# Patient Record
Sex: Male | Born: 1970 | Race: White | Hispanic: No | Marital: Single | Smoking: Current every day smoker
Health system: Southern US, Community
[De-identification: ages and names within clinical notes are randomized; demographics above are authoritative.]

## PROBLEM LIST (undated history)

## (undated) DIAGNOSIS — G4730 Sleep apnea, unspecified: Secondary | ICD-10-CM

## (undated) DIAGNOSIS — G4700 Insomnia, unspecified: Secondary | ICD-10-CM

## (undated) DIAGNOSIS — K4020 Bilateral inguinal hernia, without obstruction or gangrene, not specified as recurrent: Secondary | ICD-10-CM

## (undated) DIAGNOSIS — K439 Ventral hernia without obstruction or gangrene: Secondary | ICD-10-CM

## (undated) DIAGNOSIS — G5601 Carpal tunnel syndrome, right upper limb: Secondary | ICD-10-CM

## (undated) DIAGNOSIS — S99199A Other physeal fracture of unspecified metatarsal, initial encounter for closed fracture: Secondary | ICD-10-CM

## (undated) DIAGNOSIS — Z720 Tobacco use: Secondary | ICD-10-CM

## (undated) DIAGNOSIS — N529 Male erectile dysfunction, unspecified: Secondary | ICD-10-CM

## (undated) DIAGNOSIS — K219 Gastro-esophageal reflux disease without esophagitis: Secondary | ICD-10-CM

## (undated) DIAGNOSIS — I10 Essential (primary) hypertension: Secondary | ICD-10-CM

## (undated) DIAGNOSIS — L719 Rosacea, unspecified: Secondary | ICD-10-CM

## (undated) DIAGNOSIS — Z973 Presence of spectacles and contact lenses: Secondary | ICD-10-CM

## (undated) HISTORY — PX: TONSILLECTOMY AND ADENOIDECTOMY: SUR1326

## (undated) HISTORY — PX: UVULOPALATOPHARYNGOPLASTY: SHX827

## (undated) HISTORY — PX: BACK SURGERY: SHX140

## (undated) HISTORY — PX: HERNIA REPAIR: SHX51

## (undated) HISTORY — PX: KNEE SURGERY: SHX244

---

## 1998-02-22 HISTORY — PX: NASAL SINUS SURGERY: SHX719

## 2015-01-15 ENCOUNTER — Other Ambulatory Visit: Payer: Self-pay | Admitting: Family Medicine

## 2015-01-15 DIAGNOSIS — I4949 Other premature depolarization: Secondary | ICD-10-CM

## 2015-01-15 DIAGNOSIS — R9431 Abnormal electrocardiogram [ECG] [EKG]: Secondary | ICD-10-CM

## 2015-01-17 ENCOUNTER — Telehealth: Payer: Self-pay

## 2015-01-17 NOTE — Telephone Encounter (Signed)
He is still having head congestion, cough, blowing junk out of the nose. Wants to know if you would call in an antibiotic.

## 2015-01-17 NOTE — Telephone Encounter (Signed)
Pt. Notified.

## 2015-01-17 NOTE — Telephone Encounter (Signed)
We don't usually do antibiotics over the phone Most of these types of illness are viral and will resolve over time with rest, hydration, vitamin C, green tea, avoidance of smoking If fever develops, not getting better, please schedule appt or go to urgent care

## 2015-01-19 ENCOUNTER — Encounter: Payer: Self-pay | Admitting: Family Medicine

## 2015-01-19 ENCOUNTER — Ambulatory Visit
Admission: RE | Admit: 2015-01-19 | Discharge: 2015-01-19 | Disposition: A | Discharge status: Home / Self Care | Payer: BLUE CROSS/BLUE SHIELD | Source: Ambulatory Visit | Attending: Family Medicine | Admitting: Family Medicine

## 2015-01-19 DIAGNOSIS — I4949 Other premature depolarization: Secondary | ICD-10-CM

## 2015-01-19 DIAGNOSIS — I517 Cardiomegaly: Secondary | ICD-10-CM | POA: Insufficient documentation

## 2015-01-19 DIAGNOSIS — R9431 Abnormal electrocardiogram [ECG] [EKG]: Secondary | ICD-10-CM

## 2015-01-19 MED ORDER — PERFLUTREN LIPID MICROSPHERE
1.0000 mL | INTRAVENOUS | Status: AC | PRN
  Administered 2015-01-19: 1 mL via INTRAVENOUS

## 2015-01-19 NOTE — Progress Notes (Signed)
*  PRELIMINARY RESULTS* Echocardiogram 2D Echocardiogram has been performed.  Edward Mckenzie 01/19/2015, 11:31 AM

## 2015-01-31 ENCOUNTER — Other Ambulatory Visit: Payer: Self-pay

## 2015-01-31 MED ORDER — GABAPENTIN 300 MG PO CAPS: 300.0000 mg | ORAL_CAPSULE | Freq: Three times a day (TID) | ORAL | Status: DC

## 2015-03-28 ENCOUNTER — Other Ambulatory Visit: Payer: Self-pay | Admitting: Family Medicine

## 2015-03-28 MED ORDER — GABAPENTIN 300 MG PO CAPS: 300.0000 mg | ORAL_CAPSULE | Freq: Three times a day (TID) | ORAL | Status: DC

## 2015-03-28 NOTE — Telephone Encounter (Signed)
Routing to provider  

## 2015-03-28 NOTE — Addendum Note (Signed)
Addended byWende Mott J on: 03/28/2015 09:31 AM   Modules accepted: Orders

## 2015-04-02 ENCOUNTER — Telehealth: Payer: Self-pay | Admitting: Family Medicine

## 2015-04-02 ENCOUNTER — Ambulatory Visit (INDEPENDENT_AMBULATORY_CARE_PROVIDER_SITE_OTHER): Payer: BLUE CROSS/BLUE SHIELD | Admitting: Unknown Physician Specialty

## 2015-04-02 ENCOUNTER — Encounter: Payer: Self-pay | Admitting: Unknown Physician Specialty

## 2015-04-02 VITALS — BP 158/90 | HR 76 | Temp 98.5°F | Wt 177.4 lb

## 2015-04-02 DIAGNOSIS — J309 Allergic rhinitis, unspecified: Secondary | ICD-10-CM

## 2015-04-02 DIAGNOSIS — R9431 Abnormal electrocardiogram [ECG] [EKG]: Secondary | ICD-10-CM | POA: Insufficient documentation

## 2015-04-02 DIAGNOSIS — M659 Synovitis and tenosynovitis, unspecified: Secondary | ICD-10-CM

## 2015-04-02 MED ORDER — IBUPROFEN 800 MG PO TABS: 800.0000 mg | ORAL_TABLET | Freq: Three times a day (TID) | ORAL | Status: DC | PRN

## 2015-04-02 NOTE — Progress Notes (Signed)
   BP 158/90 mmHg  Pulse 76  Temp(Src) 98.5 F (36.9 C)  Wt 177 lb 6.4 oz (80.468 kg)  SpO2 96%   Subjective:    Patient ID: Edward Mckenzie, male    DOB: 1971-03-31, 44 y.o.   MRN: [id number]  HPI: [name] is a 44 y.o. male  Chief Complaint  Patient presents with  . Leg Injury    pt states he was playing with daughter and heard and felt something pop in calf on right leg   Leg pain Pt with the above history.  States his left lower leg is swollen and painful.  Difficulty walking.  Can point and flex foot but finding it difficult.  Taking Ibuprofen providing mild relief.  Has a job in which he is on his feet all day.    Relevant past medical, surgical, family and social history reviewed and updated as indicated. Interim medical history since our last visit reviewed. Allergies and medications reviewed and updated.  Review of Systems  Per HPI unless specifically indicated above     Objective:    BP 158/90 mmHg  Pulse 76  Temp(Src) 98.5 F (36.9 C)  Wt 177 lb 6.4 oz (80.468 kg)  SpO2 96%  Wt Readings from Last 3 Encounters:  04/02/15 177 lb 6.4 oz (80.468 kg)  01/03/15 179 lb (81.194 kg)    Physical Exam  Constitutional: He is oriented to person, place, and time. He appears well-developed and well-nourished. No distress.  HENT:  Head: Normocephalic and atraumatic.  Eyes: Conjunctivae and lids are normal. Right eye exhibits no discharge. Left eye exhibits no discharge. No scleral icterus.  Cardiovascular: Normal rate, regular rhythm and normal heart sounds.   Pulmonary/Chest: Effort normal and breath sounds normal. No respiratory distress.  Abdominal: Normal appearance. There is no splenomegaly or hepatomegaly. There is tenderness.  Musculoskeletal: Normal range of motion.       Right knee: He exhibits swelling. He exhibits no erythema.  Right calf very tender.  Discussed with Dr. Sanda Klein who saw the patient.  Will refer to Orthopedics.     Neurological: He is alert and oriented to person, place, and time.  Skin: Skin is intact. No rash noted. No pallor.  Psychiatric: He has a normal mood and affect. His behavior is normal. Judgment and thought content normal.    No results found for this or any previous visit.    Assessment & Plan:   Problem List Items Addressed This Visit    None    Visit Diagnoses    Tenosynovitis of lower leg    -  Primary    ? tear muscle or tendon    Relevant Orders    Ambulatory referral to Orthopedic Surgery        Follow up plan: Return for Refer to orthopedics soon.  .   Use crutches.  Elevate leg using heat or ice.

## 2015-04-02 NOTE — Telephone Encounter (Signed)
Text with NP this evening to find out disposition of patient from earlier today (surgery?), she replied patient was referred to specialist but did not get in today to see one; no MRI; she will check on the referral tomorrow I called patient's home number; left detailed message; reasons to go to the ER include pins and needles, tight shiny skin, worsening deep pain, etc. (described s/s of acute compartment syndrome) I do suspect possible tear of muscle and/or tendon, but swelling can lead to compartment syndrome Just wanted to follow-up

## 2015-04-02 NOTE — Patient Instructions (Signed)
Use crutches.  Elevate with heat or ice.

## 2015-04-03 NOTE — Telephone Encounter (Signed)
Edward Mckenzie, please make sure patient sees orthopaedist TODAY

## 2015-04-04 NOTE — Telephone Encounter (Signed)
I checked with Tiffany yesterday and patient had appt with Dr. Prescott Parma (ortho) at 11 am

## 2015-05-30 ENCOUNTER — Telehealth: Payer: Self-pay | Admitting: Family Medicine

## 2015-05-30 MED ORDER — MONTELUKAST SODIUM 10 MG PO TABS: 10.0000 mg | ORAL_TABLET | Freq: Every day | ORAL | Status: DC

## 2015-05-30 NOTE — Telephone Encounter (Signed)
Pt has 2 pills left until refill but would like to increase his singulair sent to [facility] [address] because he's having a rough time with his allergies

## 2015-05-30 NOTE — Telephone Encounter (Signed)
See message below, patient would like Singular to be increased but he already takes 10mg .

## 2015-05-30 NOTE — Telephone Encounter (Signed)
Let him know that the MAXIMUM dose of Singulair (montelukast) is 10 mg; he is already taking the maximum dose; I sent in refills I'll suggest PLAIN antihistamine like Zyrtec or Allegra or Claritin; generic version is fine; only take one, per package directions; he can do that with the Singulair I'll suggest referral to an allergist for testing and possibly allergy shots if really bothered; that helps many people

## 2015-05-31 NOTE — Telephone Encounter (Signed)
Spoke with pt and he stated that he was already taking a benadryl a day and i then advised him of the 3 medications suggested. He says he will try that route then make an appt to come in for referral if it doesn't get any better.

## 2015-07-27 ENCOUNTER — Other Ambulatory Visit: Payer: Self-pay | Admitting: Family Medicine

## 2015-07-27 NOTE — Telephone Encounter (Signed)
Routing to provider  

## 2015-07-28 NOTE — Telephone Encounter (Signed)
rx approved

## 2015-11-28 ENCOUNTER — Other Ambulatory Visit: Payer: Self-pay

## 2015-11-28 MED ORDER — GABAPENTIN 300 MG PO CAPS: 300.0000 mg | ORAL_CAPSULE | Freq: Three times a day (TID) | ORAL | Status: DC

## 2015-11-28 NOTE — Telephone Encounter (Signed)
Thank you; he has an upcoming appt in May; Rx approved

## 2015-11-28 NOTE — Telephone Encounter (Signed)
Routing to provider, he is following you to [place].

## 2015-12-18 ENCOUNTER — Encounter: Payer: Self-pay | Admitting: *Deleted

## 2015-12-18 NOTE — Discharge Instructions (Signed)
[facility] °[facility] ° °POST OPERATIVE INSTRUCTIONS FOR DR. TROXLER AND DR. FOWLER °[facility] ° ° °1. Take your medication as prescribed.  Pain medication should be taken only as needed. ° °2. Keep the dressing clean, dry and intact. ° °3. Keep your foot elevated above the heart level for the first 48 hours. ° °4. Walking to the bathroom and brief periods of walking are acceptable, unless we have instructed you to be non-weight bearing. ° °5. Always wear your post-op shoe when walking.  Always use your crutches if you are to be non-weight bearing. ° °6. Do not take a shower. Baths are permissible as long as the foot is kept out of the water.  ° °7. Every hour you are awake:  °- Bend your knee 15 times. °- Flex foot 15 times °- Massage calf 15 times ° °8. Call [facility] ([phone]) if any of the following problems occur: °- You develop a temperature or fever. °- The bandage becomes saturated with blood. °- Medication does not stop your pain. °- Injury of the foot occurs. °- Any symptoms of infection including redness, odor, or red streaks running from wound. ° °General Anesthesia, Adult, Care After °Refer to this sheet in the next few weeks. These instructions provide you with information on caring for yourself after your procedure. Your health care provider may also give you more specific instructions. Your treatment has been planned according to current medical practices, but problems sometimes occur. Call your health care provider if you have any problems or questions after your procedure. °WHAT TO EXPECT AFTER THE PROCEDURE °After the procedure, it is typical to experience: °· Sleepiness. °· Nausea and vomiting. °HOME CARE INSTRUCTIONS °· For the first 24 hours after general anesthesia: °¨ Have a responsible person with you. °¨ Do not drive a car. If you are alone, do not take public transportation. °¨ Do not drink alcohol. °¨ Do not take  medicine that has not been prescribed by your health care provider. °¨ Do not sign important papers or make important decisions. °¨ You may resume a normal diet and activities as directed by your health care provider. °· Change bandages (dressings) as directed. °· If you have questions or problems that seem related to general anesthesia, call the hospital and ask for the anesthetist or anesthesiologist on call. °SEEK MEDICAL CARE IF: °· You have nausea and vomiting that continue the day after anesthesia. °· You develop a rash. °SEEK IMMEDIATE MEDICAL CARE IF:  °· You have difficulty breathing. °· You have chest pain. °· You have any allergic problems. °  °This information is not intended to replace advice given to you by your health care provider. Make sure you discuss any questions you have with your health care provider. °  °Document Released: 11/03/2000 Document Revised: 08/18/2014 Document Reviewed: 11/26/2011 °Elsevier Interactive Patient Education ©2016 [employer] ° °

## 2015-12-20 ENCOUNTER — Ambulatory Visit
Admission: RE | Admit: 2015-12-20 | Discharge: 2015-12-20 | Disposition: A | Discharge status: Home / Self Care | Payer: BLUE CROSS/BLUE SHIELD | Source: Ambulatory Visit | Attending: Podiatry | Admitting: Podiatry

## 2015-12-20 ENCOUNTER — Ambulatory Visit: Payer: BLUE CROSS/BLUE SHIELD | Admitting: Anesthesiology

## 2015-12-20 ENCOUNTER — Encounter: Payer: Self-pay | Admitting: *Deleted

## 2015-12-20 ENCOUNTER — Encounter
Admission: RE | Disposition: A | Discharge status: Home / Self Care | Payer: Self-pay | Source: Ambulatory Visit | Attending: Podiatry

## 2015-12-20 DIAGNOSIS — X58XXXA Exposure to other specified factors, initial encounter: Secondary | ICD-10-CM | POA: Insufficient documentation

## 2015-12-20 DIAGNOSIS — Z79899 Other long term (current) drug therapy: Secondary | ICD-10-CM | POA: Diagnosis not present

## 2015-12-20 DIAGNOSIS — S92352A Displaced fracture of fifth metatarsal bone, left foot, initial encounter for closed fracture: Secondary | ICD-10-CM | POA: Insufficient documentation

## 2015-12-20 DIAGNOSIS — F17200 Nicotine dependence, unspecified, uncomplicated: Secondary | ICD-10-CM | POA: Diagnosis not present

## 2015-12-20 DIAGNOSIS — Y939 Activity, unspecified: Secondary | ICD-10-CM | POA: Insufficient documentation

## 2015-12-20 HISTORY — DX: Presence of spectacles and contact lenses: Z97.3

## 2015-12-20 HISTORY — DX: Carpal tunnel syndrome, right upper limb: G56.01

## 2015-12-20 HISTORY — DX: Other physeal fracture of unspecified metatarsal, initial encounter for closed fracture: S99.199A

## 2015-12-20 HISTORY — PX: ORIF TOE FRACTURE: SHX5032

## 2015-12-20 SURGERY — OPEN REDUCTION INTERNAL FIXATION (ORIF) METATARSAL (TOE) FRACTURE
Anesthesia: General | Site: Foot | Laterality: Left | Wound class: Clean

## 2015-12-20 MED ORDER — FENTANYL CITRATE (PF) 100 MCG/2ML IJ SOLN: 25.0000 ug | INTRAMUSCULAR | Status: DC | PRN

## 2015-12-20 MED ORDER — LIDOCAINE HCL (CARDIAC) 20 MG/ML IV SOLN
INTRAVENOUS | Status: DC | PRN
  Administered 2015-12-20: 30 mg via INTRATRACHEAL

## 2015-12-20 MED ORDER — LACTATED RINGERS IV SOLN
INTRAVENOUS | Status: DC
  Administered 2015-12-20: 10:00:00 via INTRAVENOUS

## 2015-12-20 MED ORDER — DEXAMETHASONE SODIUM PHOSPHATE 4 MG/ML IJ SOLN
INTRAMUSCULAR | Status: DC | PRN
  Administered 2015-12-20: 8 mg via INTRAVENOUS

## 2015-12-20 MED ORDER — ROPIVACAINE HCL 5 MG/ML IJ SOLN
INTRAMUSCULAR | Status: DC | PRN
  Administered 2015-12-20: 40 mL via PERINEURAL

## 2015-12-20 MED ORDER — GLYCOPYRROLATE 0.2 MG/ML IJ SOLN
INTRAMUSCULAR | Status: DC | PRN
  Administered 2015-12-20: 0.2 mg via INTRAVENOUS

## 2015-12-20 MED ORDER — ONDANSETRON HCL 4 MG/2ML IJ SOLN: 4.0000 mg | Freq: Once | INTRAMUSCULAR | Status: DC | PRN

## 2015-12-20 MED ORDER — ONDANSETRON HCL 4 MG/2ML IJ SOLN
INTRAMUSCULAR | Status: DC | PRN
  Administered 2015-12-20: 4 mg via INTRAVENOUS

## 2015-12-20 MED ORDER — SCOPOLAMINE 1 MG/3DAYS TD PT72
1.0000 | MEDICATED_PATCH | Freq: Once | TRANSDERMAL | Status: DC
  Administered 2015-12-20: 1.5 mg via TRANSDERMAL

## 2015-12-20 MED ORDER — FENTANYL CITRATE (PF) 100 MCG/2ML IJ SOLN
INTRAMUSCULAR | Status: DC | PRN
  Administered 2015-12-20: 100 ug via INTRAVENOUS

## 2015-12-20 MED ORDER — OXYCODONE-ACETAMINOPHEN 7.5-325 MG PO TABS: 1.0000 | ORAL_TABLET | ORAL | Status: DC | PRN

## 2015-12-20 MED ORDER — CEFAZOLIN SODIUM-DEXTROSE 2-4 GM/100ML-% IV SOLN
2.0000 g | Freq: Once | INTRAVENOUS | Status: AC
  Administered 2015-12-20: 2 g via INTRAVENOUS

## 2015-12-20 MED ORDER — PROPOFOL 10 MG/ML IV BOLUS
INTRAVENOUS | Status: DC | PRN
  Administered 2015-12-20: 200 mg via INTRAVENOUS

## 2015-12-20 MED ORDER — MIDAZOLAM HCL 2 MG/2ML IJ SOLN
INTRAMUSCULAR | Status: DC | PRN
  Administered 2015-12-20: 2 mg via INTRAVENOUS

## 2015-12-20 SURGICAL SUPPLY — 52 items
APL SKNCLS STERI-STRIP NONHPOA (GAUZE/BANDAGES/DRESSINGS)
BANDAGE ELASTIC 4 VELCRO NS (GAUZE/BANDAGES/DRESSINGS) ×2 IMPLANT
BENZOIN TINCTURE PRP APPL 2/3 (GAUZE/BANDAGES/DRESSINGS) ×1 IMPLANT
BIT DRILL CANN 3.5MM (DRILL) IMPLANT
BNDG CMPR 75X41 PLY HI ABS (GAUZE/BANDAGES/DRESSINGS) ×1
BNDG ESMARK 4X12 TAN STRL LF (GAUZE/BANDAGES/DRESSINGS) ×2 IMPLANT
BNDG GAUZE 4.5X4.1 6PLY STRL (MISCELLANEOUS) ×2 IMPLANT
BNDG STRETCH 4X75 STRL LF (GAUZE/BANDAGES/DRESSINGS) ×2 IMPLANT
CANISTER SUCT 1200ML W/VALVE (MISCELLANEOUS) ×2 IMPLANT
COVER LIGHT HANDLE UNIVERSAL (MISCELLANEOUS) ×4 IMPLANT
COVER PIN YLW 0.028-062 (MISCELLANEOUS) IMPLANT
CUFF TOURN SGL QUICK 18 (TOURNIQUET CUFF) ×1 IMPLANT
DRAPE FLUOR MINI C-ARM 54X84 (DRAPES) ×2 IMPLANT
DRILL CANN 3.5MM (DRILL) ×2
DURAPREP 26ML APPLICATOR (WOUND CARE) ×2 IMPLANT
GAUZE PETRO XEROFOAM 1X8 (MISCELLANEOUS) ×2 IMPLANT
GAUZE SPONGE 4X4 12PLY STRL (GAUZE/BANDAGES/DRESSINGS) ×2 IMPLANT
GLOVE BIO SURGEON STRL SZ8 (GLOVE) ×2 IMPLANT
GOWN STRL REUS W/ TWL LRG LVL3 (GOWN DISPOSABLE) ×1 IMPLANT
GOWN STRL REUS W/ TWL XL LVL3 (GOWN DISPOSABLE) ×1 IMPLANT
GOWN STRL REUS W/TWL LRG LVL3 (GOWN DISPOSABLE) ×4
GOWN STRL REUS W/TWL XL LVL3 (GOWN DISPOSABLE) ×2
K-WIRE DBL END TROCAR 6X.045 (WIRE)
K-WIRE DBL END TROCAR 6X.062 (WIRE)
KIT ROOM TURNOVER OR (KITS) ×2 IMPLANT
KWIRE DBL END TROCAR 6X.045 (WIRE) IMPLANT
KWIRE DBL END TROCAR 6X.062 (WIRE) IMPLANT
KWIRE SMOOTH 1.8 (WIRE) ×1 IMPLANT
NDL HYPO 25GX1X1/2 BEV (NEEDLE) IMPLANT
NEEDLE HYPO 25GX1X1/2 BEV (NEEDLE) IMPLANT
NS IRRIG 500ML POUR BTL (IV SOLUTION) ×2 IMPLANT
PACK EXTREMITY ARMC (MISCELLANEOUS) ×2 IMPLANT
PAD GROUND ADULT SPLIT (MISCELLANEOUS) ×2 IMPLANT
PADDING CAST 4IN STRL (MISCELLANEOUS) ×2
PADDING CAST BLEND 4X4 STRL (MISCELLANEOUS) ×2 IMPLANT
SCREW CANN 5.0X50MM (Screw) ×2 IMPLANT
SCREW CANN PT 50X5XCANN NS MTP (Screw) ×1 IMPLANT
SCREW CANN PT 5X50 NS (Screw) IMPLANT
SPLINT FAST PLASTER 5X30 (CAST SUPPLIES) ×1
SPLINT PLASTER CAST FAST 5X30 (CAST SUPPLIES) ×1 IMPLANT
STIMULATOR BONE (ORTHOPEDIC SUPPLIES) ×1
STIMULATOR BONE GROWTH EMG EXT (ORTHOPEDIC SUPPLIES) IMPLANT
STOCKINETTE STRL 6IN 960660 (GAUZE/BANDAGES/DRESSINGS) ×2 IMPLANT
STRAP BODY AND KNEE 60X3 (MISCELLANEOUS) ×3 IMPLANT
STRIP CLOSURE SKIN 1/4X4 (GAUZE/BANDAGES/DRESSINGS) ×2 IMPLANT
SUT ETHILON 4-0 (SUTURE)
SUT ETHILON 4-0 FS2 18XMFL BLK (SUTURE)
SUT ETHILON 5-0 FS-2 18 BLK (SUTURE) ×1 IMPLANT
SUT VIC AB 4-0 FS2 27 (SUTURE) ×2 IMPLANT
SUT VICRYL AB 3-0 FS1 BRD 27IN (SUTURE) IMPLANT
SUTURE ETHLN 4-0 FS2 18XMF BLK (SUTURE) IMPLANT
SYRINGE 10CC LL (SYRINGE) IMPLANT

## 2015-12-20 NOTE — Transfer of Care (Signed)
Immediate Anesthesia Transfer of Care Note  Patient: Edward Mckenzie  Procedure(s) Performed: Procedure(s) with comments: OPEN REDUCTION INTERNAL FIXATION (ORIF) 5TH METATARSAL (TOE) FRACTURE LEFT (Left) - LMA WITH POPLITEAL  Patient Location: PACU  Anesthesia Type: General LMA  Level of Consciousness: awake, alert  and patient cooperative  Airway and Oxygen Therapy: Patient Spontanous Breathing and Patient connected to supplemental oxygen  Post-op Assessment: Post-op Vital signs reviewed, Patient's Cardiovascular Status Stable, Respiratory Function Stable, Patent Airway and No signs of Nausea or vomiting  Post-op Vital Signs: Reviewed and stable  Complications: No apparent anesthesia complications

## 2015-12-20 NOTE — Progress Notes (Signed)
Assisted Clance Boll ANMD with left, ultrasound guided, popliteal block. Side rails up, monitors on throughout procedure. See vital signs in flow sheet. Tolerated Procedure well.

## 2015-12-20 NOTE — H&P (Signed)
H and P has been reviewed and no changes are noted.  

## 2015-12-20 NOTE — Anesthesia Procedure Notes (Addendum)
Anesthesia Regional Block:  Popliteal block  Pre-Anesthetic Checklist: ,, timeout performed, Correct Patient, Correct Site, Correct Laterality, Correct Procedure, Correct Position, site marked, Risks and benefits discussed,  Surgical consent,  Pre-op evaluation,  At surgeon's request and post-op pain management  Laterality: Left  Prep: chloraprep       Needles:  Injection technique: Single-shot  Needle Type: Echogenic Needle     Needle Length: 9cm 9 cm Needle Gauge: 21 and 21 G    Additional Needles:  Procedures: ultrasound guided (picture in chart) Popliteal block Narrative:  Start time: 12/20/2015 10:41 AM End time: 12/20/2015 10:49 AM Injection made incrementally with aspirations every 5 mL.  Performed by: Personally  Anesthesiologist: Ronelle Nigh  Additional Notes: Functioning IV was confirmed and monitors applied. Ultrasound guidance: relevant anatomy identified, needle position confirmed, local anesthetic spread visualized around nerve(s)., vascular puncture avoided.  Image printed for medical record.  Negative aspiration and no paresthesias; incremental administration of local anesthetic. The patient tolerated the procedure well. Vitals signes recorded in RN notes.  30 ml to Popliteal, 10 ml to Adductor Canal.   Procedure Name: LMA Insertion Date/Time: 12/20/2015 12:42 PM Performed by: Londell Moh Pre-anesthesia Checklist: Patient identified, Emergency Drugs available, Suction available, Timeout performed and Patient being monitored Patient Re-evaluated:Patient Re-evaluated prior to inductionOxygen Delivery Method: Circle system utilized Preoxygenation: Pre-oxygenation with 100% oxygen Intubation Type: IV induction LMA: LMA inserted LMA Size: 4.0 Number of attempts: 1 Placement Confirmation: positive ETCO2 and breath sounds checked- equal and bilateral Tube secured with: Tape

## 2015-12-20 NOTE — Anesthesia Preprocedure Evaluation (Signed)
Anesthesia Evaluation  Patient identified by MRN, date of birth, ID band  Reviewed: Allergy & Precautions, H&P , NPO status , Patient's Chart, lab work & pertinent test results  Airway Mallampati: II  TM Distance: >3 FB Neck ROM: full    Dental no notable dental hx.    Pulmonary Current Smoker,    Pulmonary exam normal        Cardiovascular  Rhythm:regular Rate:Normal     Neuro/Psych    GI/Hepatic   Endo/Other    Renal/GU      Musculoskeletal   Abdominal   Peds  Hematology   Anesthesia Other Findings   Reproductive/Obstetrics                             Anesthesia Physical Anesthesia Plan  ASA: II  Anesthesia Plan: General LMA   Post-op Pain Management: GA combined w/ Regional for post-op pain   Induction:   Airway Management Planned:   Additional Equipment:   Intra-op Plan:   Post-operative Plan:   Informed Consent: I have reviewed the patients History and Physical, chart, labs and discussed the procedure including the risks, benefits and alternatives for the proposed anesthesia with the patient or authorized representative who has indicated his/her understanding and acceptance.     Plan Discussed with: CRNA  Anesthesia Plan Comments:         Anesthesia Quick Evaluation

## 2015-12-20 NOTE — Anesthesia Postprocedure Evaluation (Signed)
Anesthesia Post Note  Patient: HARMON DEIGHAN  Procedure(s) Performed: Procedure(s) (LRB): OPEN REDUCTION INTERNAL FIXATION (ORIF) 5TH METATARSAL (TOE) FRACTURE LEFT (Left)  Patient location during evaluation: PACU Anesthesia Type: General Level of consciousness: awake and alert and oriented Pain management: satisfactory to patient Vital Signs Assessment: post-procedure vital signs reviewed and stable Respiratory status: spontaneous breathing, nonlabored ventilation and respiratory function stable Cardiovascular status: blood pressure returned to baseline and stable Postop Assessment: Adequate PO intake and No signs of nausea or vomiting Anesthetic complications: no    Raliegh Ip

## 2015-12-20 NOTE — Op Note (Signed)
Operative note   Surgeon: Dr. Albertine Patricia, DPM.    Assistant: None    Preop diagnosis: Ronnald Ramp fracture fifth metatarsal left foot    Postop diagnosis: Same    Procedure:   1. ORIF Jones fracture fifth metatarsal left foot with 5.0 cannulated screw from [employer]          EBL: Less than 10 cc    Anesthesia:general with popliteal block delivered by anesthesia team    Hemostasis: None    Specimen: None    Complications: None    Operative indications: Jones fracture fifth metatarsal left foot with some minor displacement. Jones fractures are well known to have slow healing progress and do better with ORIF in most cases.    Procedure:  Patient was brought into the OR and placed on the operating table in thesupine position. After anesthesia was obtained theleft lower extremity was prepped and draped in usual sterile fashion.  Operative Report: This time to his directed to the lateral aspect of the left foot where the fifth metatarsal tuberosity was evaluated. A 1 cm linear incision was made approximately 1.52 cm proximal to that. There was deepened with blunt dissection this point a K wire from the 5.0 can a screw set was then run through the tuberosity down the shaft of the fifth metatarsal. There is checked FluoroScan the patient was seen to be in good position and both lateral dorsal plantar and oblique views. This point the area was countersunk and measured and then drilled. A 50 mm 5.0 and a screws and placed across the fracture margin running from the tuberosity proximally down through the shaft distally all screw threads were well distal to the fragment. The fifth metatarsal was compressed and well aligned this point. At this time the incision was copiously irrigated the pin was removed and the incision was closed with 5-0 nylon horizontal mattress sutures. Sterile compressive dressings placed across wound consisting of Xeroform gauze 4 x 4's Kling Kerlix and a posterior splint was  placed on the left foot and leg in the operating room.     Patient tolerated the procedure and anesthesia well.  Was transported from the OR to the PACU with all vital signs stable and vascular status intact. To be discharged per routine protocol.  Will follow up in approximately 1 week in the outpatient clinic.

## 2015-12-21 ENCOUNTER — Encounter: Payer: Self-pay | Admitting: Podiatry

## 2016-01-04 ENCOUNTER — Encounter: Payer: Self-pay | Admitting: Family Medicine

## 2016-01-10 ENCOUNTER — Telehealth: Payer: Self-pay

## 2016-01-10 MED ORDER — GABAPENTIN 300 MG PO CAPS: 300.0000 mg | ORAL_CAPSULE | Freq: Three times a day (TID) | ORAL | Status: DC

## 2016-01-10 NOTE — Telephone Encounter (Signed)
Requesting a refill on  Gabapentin 300mg  : take one tablet by mouth 3 times a day Patient is scheduled for a CPE the middle of June

## 2016-01-24 ENCOUNTER — Encounter: Payer: Self-pay | Admitting: Family Medicine

## 2016-01-24 ENCOUNTER — Ambulatory Visit (INDEPENDENT_AMBULATORY_CARE_PROVIDER_SITE_OTHER): Payer: BLUE CROSS/BLUE SHIELD | Admitting: Family Medicine

## 2016-01-24 VITALS — BP 152/77 | HR 82 | Temp 98.8°F | Ht 69.2 in | Wt 181.0 lb

## 2016-01-24 DIAGNOSIS — R030 Elevated blood-pressure reading, without diagnosis of hypertension: Secondary | ICD-10-CM | POA: Diagnosis not present

## 2016-01-24 DIAGNOSIS — G4700 Insomnia, unspecified: Secondary | ICD-10-CM

## 2016-01-24 DIAGNOSIS — Z0000 Encounter for general adult medical examination without abnormal findings: Secondary | ICD-10-CM

## 2016-01-24 DIAGNOSIS — IMO0001 Reserved for inherently not codable concepts without codable children: Secondary | ICD-10-CM

## 2016-01-24 LAB — UA/M W/RFLX CULTURE, ROUTINE
BILIRUBIN UA: NEGATIVE
GLUCOSE, UA: NEGATIVE
KETONES UA: NEGATIVE
Leukocytes, UA: NEGATIVE
Nitrite, UA: NEGATIVE
PROTEIN UA: NEGATIVE
RBC, UA: NEGATIVE
UUROB: 0.2 mg/dL (ref 0.2–1.0)
pH, UA: 6 (ref 5.0–7.5)

## 2016-01-24 NOTE — Progress Notes (Signed)
BP 152/77 mmHg  Pulse 82  Temp(Src) 98.8 F (37.1 C)  Ht 5' 9.2" (1.758 m)  Wt 181 lb (82.101 kg)  BMI 26.57 kg/m2  SpO2 99%   Subjective:    Patient ID: Edward Mckenzie, male    DOB: 11-10-1970, 45 y.o.   MRN: [id number]  HPI: Edward Mckenzie is a 45 y.o. male presenting on 01/24/2016 for comprehensive medical examination. Current medical complaints include:  INSOMNIA- has been very stressed, drinks caffeine all day up to dinner Duration: chronic Satisfied with sleep quality: no Difficulty falling asleep: yes Difficulty staying asleep: yes Waking a few hours after sleep onset: yes Early morning awakenings: yes Daytime hypersomnolence: yes Wakes feeling refreshed: yes Good sleep hygiene: no Apnea: yes- in the past, had sleep apnea surgery, hasn't had sleep study in a while Snoring: yes Depressed/anxious mood: yes Recent stress: yes Restless legs/nocturnal leg cramps: no Chronic pain/arthritis: no History of sleep study: yes Treatments attempted: melatonin   Interim Problems from his last visit: no  Depression Screen done today and results listed below:  Depression screen [facility] 2/9 01/24/2016  Decreased Interest 0  Down, Depressed, Hopeless 0  PHQ - 2 Score 0  Altered sleeping 3  Tired, decreased energy 2  Change in appetite 0  Feeling bad or failure about yourself  0  Trouble concentrating 0  Moving slowly or fidgety/restless 0  Suicidal thoughts 0  PHQ-9 Score 5    Past Medical History:  Past Medical History  Diagnosis Date  . Carpal tunnel syndrome of right wrist   . Jones fracture     left foot  . Wears contact lenses     Surgical History:  Past Surgical History  Procedure Laterality Date  . Knee surgery    . Back surgery    . Nasal sinus surgery  02/22/98    UPPP, tonguepexy, septo.  Dr. Kathyrn Sheriff. [place]  . Tonsillectomy and adenoidectomy    . Orif toe fracture Left 12/20/2015    Procedure: OPEN REDUCTION INTERNAL FIXATION (ORIF) 5TH  METATARSAL (TOE) FRACTURE LEFT;  Surgeon: Albertine Patricia, DPM;  Location: [place];  Service: Podiatry;  Laterality: Left;  LMA WITH POPLITEAL    Medications:  Current Outpatient Prescriptions on File Prior to Visit  Medication Sig  . fluticasone (FLONASE) 50 MCG/ACT nasal spray INSTILL TWO SPRAYS IN EACH NOSTRIL ONCE A DAY  . gabapentin (NEURONTIN) 300 MG capsule Take 1 capsule (300 mg total) by mouth 3 (three) times daily.  . montelukast (SINGULAIR) 10 MG tablet Take 1 tablet (10 mg total) by mouth at bedtime.  . Omega-3 Fatty Acids (FISH OIL) 1000 MG CAPS Take 1,000 mg by mouth daily.  Marland Kitchen thiamine (VITAMIN B-1) 100 MG tablet Take 100 mg by mouth daily.   No current facility-administered medications on file prior to visit.    Allergies:  No Known Allergies  Social History:  Social History   Social History  . Marital Status: Single    Spouse Name: N/A  . Number of Children: N/A  . Years of Education: N/A   Occupational History  . Not on file.   Social History Main Topics  . Smoking status: Current Every Day Smoker -- 1.00 packs/day    Types: Cigarettes  . Smokeless tobacco: Former Systems developer  . Alcohol Use: 0.0 oz/week    0 Standard drinks or equivalent per week     Comment: pt states "recently quit".  . Drug Use: No  . Sexual Activity: Yes  Other Topics Concern  . Not on file   Social History Narrative   History  Smoking status  . Current Every Day Smoker -- 1.00 packs/day  . Types: Cigarettes  Smokeless tobacco  . Former User   History  Alcohol Use  . 0.0 oz/week  . 0 Standard drinks or equivalent per week    Comment: pt states "recently quit".    Family History:  Family History  Problem Relation Age of Onset  . Heart disease Father   . Diabetes Paternal Grandfather   . Heart disease Paternal Grandfather     Past medical history, surgical history, medications, allergies, family history and social history reviewed with patient today and  changes made to appropriate areas of the chart.   Review of Systems  Constitutional: Negative.   HENT: Negative.   Eyes: Negative.   Respiratory: Negative.   Cardiovascular: Negative.   Gastrointestinal: Negative.   Genitourinary: Negative.   Musculoskeletal: Negative.   Skin: Negative.   Neurological: Negative.   Endo/Heme/Allergies: Positive for environmental allergies. Negative for polydipsia. Does not bruise/bleed easily.  Psychiatric/Behavioral: Negative for depression, suicidal ideas, hallucinations, memory loss and substance abuse. The patient is nervous/anxious and has insomnia.     All other ROS negative except what is listed above and in the HPI.      Objective:    BP 152/77 mmHg  Pulse 82  Temp(Src) 98.8 F (37.1 C)  Ht 5' 9.2" (1.758 m)  Wt 181 lb (82.101 kg)  BMI 26.57 kg/m2  SpO2 99%  Wt Readings from Last 3 Encounters:  01/24/16 181 lb (82.101 kg)  12/20/15 175 lb (79.379 kg)  04/02/15 177 lb 6.4 oz (80.468 kg)    Physical Exam  Constitutional: He is oriented to person, place, and time. He appears well-developed and well-nourished. No distress.  HENT:  Head: Normocephalic and atraumatic.  Right Ear: Hearing and external ear normal.  Left Ear: Hearing and external ear normal.  Nose: Nose normal.  Mouth/Throat: Oropharynx is clear and moist. No oropharyngeal exudate.  Eyes: Conjunctivae, EOM and lids are normal. Pupils are equal, round, and reactive to light. Right eye exhibits no discharge. Left eye exhibits no discharge. No scleral icterus.  Neck: Normal range of motion. Neck supple. No JVD present. No tracheal deviation present. No thyromegaly present.  Cardiovascular: Normal rate, regular rhythm, normal heart sounds and intact distal pulses.  Exam reveals no gallop and no friction rub.   No murmur heard. Pulmonary/Chest: Effort normal and breath sounds normal. No stridor. No respiratory distress. He has no wheezes. He has no rales. He exhibits no  tenderness.  Abdominal: Soft. Bowel sounds are normal. He exhibits no distension and no mass. There is no tenderness. There is no rebound and no guarding. Hernia confirmed negative in the right inguinal area and confirmed negative in the left inguinal area.  Genitourinary: Testes normal and penis normal. Cremasteric reflex is present. Right testis shows no mass, no swelling and no tenderness. Right testis is descended. Cremasteric reflex is not absent on the right side. Left testis shows no mass, no swelling and no tenderness. Left testis is descended. Cremasteric reflex is not absent on the left side. Circumcised. No penile tenderness.  Musculoskeletal: Normal range of motion. He exhibits no edema or tenderness.  Lymphadenopathy:    He has no cervical adenopathy.       Right: No inguinal adenopathy present.       Left: No inguinal adenopathy present.  Neurological: He is alert and  oriented to person, place, and time. He has normal reflexes. He displays normal reflexes. No cranial nerve deficit. He exhibits normal muscle tone. Coordination normal.  Skin: Skin is warm, dry and intact. No rash noted. He is not diaphoretic. No erythema. No pallor.  Psychiatric: He has a normal mood and affect. His speech is normal and behavior is normal. Judgment and thought content normal. Cognition and memory are normal.  Nursing note and vitals reviewed.   Results for orders placed or performed in visit on 01/24/16  UA/M w/rflx Culture, Routine  Result Value Ref Range   Specific Gravity, UA <1.005 (L) 1.005 - 1.030   pH, UA 6.0 5.0 - 7.5   Color, UA Yellow Yellow   Appearance Ur Clear Clear   Leukocytes, UA Negative Negative   Protein, UA Negative Negative/Trace   Glucose, UA Negative Negative   Ketones, UA Negative Negative   RBC, UA Negative Negative   Bilirubin, UA Negative Negative   Urobilinogen, Ur 0.2 0.2 - 1.0 mg/dL   Nitrite, UA Negative Negative      Assessment & Plan:   Problem List Items  Addressed This Visit      Other   Insomnia    Will stop caffeine after 3PM. Will work on sleep hygiene. Will check in by phone in 2 weeks. If not better, will start him on trazodone       Other Visit Diagnoses    Routine general medical examination at a health care facility    -  Primary    Up to date on vaccines. Screening labs checked today (not fasting). Continue diet and exercise. Continue to monitor.     Relevant Orders    CBC with Differential/Platelet    Comprehensive metabolic panel    Lipid Panel w/o Chol/HDL Ratio    TSH    UA/M w/rflx Culture, Routine (Completed)    Elevated BP        Had some confrontations today and feeling stressed. Will work on [employer] and recheck in 2 months.         LABORATORY TESTING:  Health maintenance labs ordered today as discussed above.   IMMUNIZATIONS:   - Tdap: Tetanus vaccination status reviewed: last tetanus booster within 10 years. - Influenza: Postponed to flu season - Pneumovax: Refused  PATIENT COUNSELING:    Sexuality: Discussed sexually transmitted diseases, partner selection, use of condoms, avoidance of unintended pregnancy  and contraceptive alternatives.   Advised to avoid cigarette smoking.  I discussed with the patient that most people either abstain from alcohol or drink within safe limits (<=14/week and <=4 drinks/occasion for males, <=7/weeks and <= 3 drinks/occasion for females) and that the risk for alcohol disorders and other health effects rises proportionally with the number of drinks per week and how often a drinker exceeds daily limits.  Discussed cessation/primary prevention of drug use and availability of treatment for abuse.   Diet: Encouraged to adjust caloric intake to maintain  or achieve ideal body weight, to reduce intake of dietary saturated fat and total fat, to limit sodium intake by avoiding high sodium foods and not adding table salt, and to maintain adequate dietary potassium and calcium  preferably from fresh fruits, vegetables, and low-fat dairy products.    stressed the importance of regular exercise  Injury prevention: Discussed safety belts, safety helmets, smoke detector, smoking near bedding or upholstery.   Dental health: Discussed importance of regular tooth brushing, flossing, and dental visits.   Follow up plan: NEXT  PREVENTATIVE PHYSICAL DUE IN 1 YEAR. Return in about 2 months (around 03/25/2016) for Follow up BP.

## 2016-01-24 NOTE — Patient Instructions (Signed)
Health Maintenance, Male A healthy lifestyle and preventative care can promote health and wellness.  Maintain regular health, dental, and eye exams.  Eat a healthy diet. Foods like vegetables, fruits, whole grains, low-fat dairy products, and lean protein foods contain the nutrients you need and are low in calories. Decrease your intake of foods high in solid fats, added sugars, and salt. Get information about a proper diet from your health care provider, if necessary.  Regular physical exercise is one of the most important things you can do for your health. Most adults should get at least 150 minutes of moderate-intensity exercise (any activity that increases your heart rate and causes you to sweat) each week. In addition, most adults need muscle-strengthening exercises on 2 or more days a week.   Maintain a healthy weight. The body mass index (BMI) is a screening tool to identify possible weight problems. It provides an estimate of body fat based on height and weight. Your health care provider can find your BMI and can help you achieve or maintain a healthy weight. For males 20 years and older:  A BMI below 18.5 is considered underweight.  A BMI of 18.5 to 24.9 is normal.  A BMI of 25 to 29.9 is considered overweight.  A BMI of 30 and above is considered obese.  Maintain normal blood lipids and cholesterol by exercising and minimizing your intake of saturated fat. Eat a balanced diet with plenty of fruits and vegetables. Blood tests for lipids and cholesterol should begin at age 20 and be repeated every 5 years. If your lipid or cholesterol levels are high, you are over age 50, or you are at high risk for heart disease, you may need your cholesterol levels checked more frequently.Ongoing high lipid and cholesterol levels should be treated with medicines if diet and exercise are not working.  If you smoke, find out from your health care provider how to quit. If you do not use tobacco, do not  start.  Lung cancer screening is recommended for adults aged 55-80 years who are at high risk for developing lung cancer because of a history of smoking. A yearly low-dose CT scan of the lungs is recommended for people who have at least a 30-pack-year history of smoking and are current smokers or have quit within the past 15 years. A pack year of smoking is smoking an average of 1 pack of cigarettes a day for 1 year (for example, a 30-pack-year history of smoking could mean smoking 1 pack a day for 30 years or 2 packs a day for 15 years). Yearly screening should continue until the smoker has stopped smoking for at least 15 years. Yearly screening should be stopped for people who develop a health problem that would prevent them from having lung cancer treatment.  If you choose to drink alcohol, do not have more than 2 drinks per day. One drink is considered to be 12 oz (360 mL) of beer, 5 oz (150 mL) of wine, or 1.5 oz (45 mL) of liquor.  Avoid the use of street drugs. Do not share needles with anyone. Ask for help if you need support or instructions about stopping the use of drugs.  High blood pressure causes heart disease and increases the risk of stroke. High blood pressure is more likely to develop in:  People who have blood pressure in the end of the normal range (100-139/85-89 mm Hg).  People who are overweight or obese.  People who are African American.    If you are 18-39 years of age, have your blood pressure checked every 3-5 years. If you are 40 years of age or older, have your blood pressure checked every year. You should have your blood pressure measured twice--once when you are at a hospital or clinic, and once when you are not at a hospital or clinic. Record the average of the two measurements. To check your blood pressure when you are not at a hospital or clinic, you can use:  An automated blood pressure machine at a pharmacy.  A home blood pressure monitor.  If you are 45-79 years  old, ask your health care provider if you should take aspirin to prevent heart disease.  Diabetes screening involves taking a blood sample to check your fasting blood sugar level. This should be done once every 3 years after age 45 if you are at a normal weight and without risk factors for diabetes. Testing should be considered at a younger age or be carried out more frequently if you are overweight and have at least 1 risk factor for diabetes.  Colorectal cancer can be detected and often prevented. Most routine colorectal cancer screening begins at the age of 50 and continues through age 75. However, your health care provider may recommend screening at an earlier age if you have risk factors for colon cancer. On a yearly basis, your health care provider may provide home test kits to check for hidden blood in the stool. A small camera at the end of a tube may be used to directly examine the colon (sigmoidoscopy or colonoscopy) to detect the earliest forms of colorectal cancer. Talk to your health care provider about this at age 50 when routine screening begins. A direct exam of the colon should be repeated every 5-10 years through age 75, unless early forms of precancerous polyps or small growths are found.  People who are at an increased risk for hepatitis B should be screened for this virus. You are considered at high risk for hepatitis B if:  You were born in a country where hepatitis B occurs often. Talk with your health care provider about which countries are considered high risk.  Your parents were born in a high-risk country and you have not received a shot to protect against hepatitis B (hepatitis B vaccine).  You have HIV or AIDS.  You use needles to inject street drugs.  You live with, or have sex with, someone who has hepatitis B.  You are a man who has sex with other men (MSM).  You get hemodialysis treatment.  You take certain medicines for conditions like cancer, organ  transplantation, and autoimmune conditions.  Hepatitis C blood testing is recommended for all people born from 1945 through 1965 and any individual with known risk factors for hepatitis C.  Healthy men should no longer receive prostate-specific antigen (PSA) blood tests as part of routine cancer screening. Talk to your health care provider about prostate cancer screening.  Testicular cancer screening is not recommended for adolescents or adult males who have no symptoms. Screening includes self-exam, a health care provider exam, and other screening tests. Consult with your health care provider about any symptoms you have or any concerns you have about testicular cancer.  Practice safe sex. Use condoms and avoid high-risk sexual practices to reduce the spread of sexually transmitted infections (STIs).  You should be screened for STIs, including gonorrhea and chlamydia if:  You are sexually active and are younger than 24 years.  You   are older than 24 years, and your health care provider tells you that you are at risk for this type of infection.  Your sexual activity has changed since you were last screened, and you are at an increased risk for chlamydia or gonorrhea. Ask your health care provider if you are at risk.  If you are at risk of being infected with HIV, it is recommended that you take a prescription medicine daily to prevent HIV infection. This is called pre-exposure prophylaxis (PrEP). You are considered at risk if:  You are a man who has sex with other men (MSM).  You are a heterosexual man who is sexually active with multiple partners.  You take drugs by injection.  You are sexually active with a partner who has HIV.  Talk with your health care provider about whether you are at high risk of being infected with HIV. If you choose to begin PrEP, you should first be tested for HIV. You should then be tested every 3 months for as long as you are taking PrEP.  Use sunscreen. Apply  sunscreen liberally and repeatedly throughout the day. You should seek shade when your shadow is shorter than you. Protect yourself by wearing long sleeves, pants, a wide-brimmed hat, and sunglasses year round whenever you are outdoors.  Tell your health care provider of new moles or changes in moles, especially if there is a change in shape or color. Also, tell your health care provider if a mole is larger than the size of a pencil eraser.  A one-time screening for abdominal aortic aneurysm (AAA) and surgical repair of large AAAs by ultrasound is recommended for men aged 64-75 years who are current or former smokers.  Stay current with your vaccines (immunizations).   This information is not intended to replace advice given to you by your health care provider. Make sure you discuss any questions you have with your health care provider.   Document Released: 01/24/2008 Document Revised: 08/18/2014 Document Reviewed: 12/23/2010 Elsevier Interactive Patient Education 2016 [employer]. What is insomnia? - Insomnia is a problem with sleep. People with insomnia have trouble falling or staying asleep, or they do not feel rested when they wake up. Insomnia is not about the number of hours of sleep a person gets. Everyone needs a different amount of sleep. What are the symptoms of insomnia? - People with insomnia often: ?Have trouble falling or staying asleep ?Feel tired or sleepy during the day  ?Forget things or have trouble thinking clearly ?Get cranky, anxious, irritable, or depressed ?Have less energy or interest in doing things ?Make mistakes or get into accidents more often than normal ?Worry about their lack of sleep These symptoms can be so bad that they affect a person's relationships or work life. Plus, they can happen even in people who seem to be sleeping enough hours. Are there tests I should have? - Probably not. Most people with insomnia need no tests. Your doctor or nurse will  probably be able to tell what is wrong just by talking to you. He or she might also ask you to keep a daily log for 1 to 2 weeks, where you keep track of how you sleep each night. In some cases, people do need special sleep tests, such as "polysomnography" or "actigraphy." ?Polysomnography - Polysomnography is a test that usually lasts all night and that is done in a sleep lab. During the test, monitors are attached to your body to record movement, brain activity, breathing, and  other body functions. ?Actigraphy - Actigraphy records activity and movement with a monitor or motion detector that is usually worn on the wrist. The test is done at home, over several days and nights. It will record how much you actually sleep and when. What can I do to improve my insomnia? - You can follow good "sleep hygiene." That means that you: ?Sleep only long enough to feel rested and then get out of bed ?Go to bed and get up at the same time every day ?Do not try to force yourself to sleep. If you can't sleep, get out of bed and try again later. ?Have coffee, tea, and other foods that have caffeine only in the morning ?Avoid alcohol in the late afternoon, evening, and bedtime ?Avoid smoking, especially in the evening ?Keep your bedroom dark, cool, quiet, and free of reminders of work or other things that cause you stress ?Solve problems you have before you go to bed ?Exercise several days a week, but not right before bed ?Avoid looking at phones or reading devices ("e-books") that give off light before bed. This can make it harder to fall asleep. Other things that can improve sleep include: ?Relaxation therapy, in which you focus on relaxing all the muscles in your body one by one ?Working with a Engineer, materials to deal with the problems that might be causing poor sleep Should I see a doctor or nurse? - Yes. If you have insomnia, and it is troubling you, see your doctor or nurse. He or she might have  suggestions on how to fix the problem. Are there medicines to help me sleep? - Yes, there are medicines to help with sleep. But you should try them only after you try the techniques described above. You also should not use sleep medicines every night for long periods of time. Otherwise, you can become dependent on them for sleep. Insomnia is sometimes caused by mental health problems, such as depression or anxiety. If that's the case for you, you might benefit from an antidepressant rather than a sleep aid. Antidepressants often improve sleep and can help with other worries, too. Can I use alcohol to help me sleep? - No, do not use alcohol as a sleep aid. Even though alcohol makes you sleepy at first, it disrupts sleep later in the night.  DASH Eating Plan DASH stands for "Dietary Approaches to Stop Hypertension." The DASH eating plan is a healthy eating plan that has been shown to reduce high blood pressure (hypertension). Additional health benefits may include reducing the risk of type 2 diabetes mellitus, heart disease, and stroke. The DASH eating plan may also help with weight loss. WHAT DO I NEED TO KNOW ABOUT THE DASH EATING PLAN? For the DASH eating plan, you will follow these general guidelines:  Choose foods with a percent daily value for sodium of less than 5% (as listed on the food label).  Use salt-free seasonings or herbs instead of table salt or sea salt.  Check with your health care provider or pharmacist before using salt substitutes.  Eat lower-sodium products, often labeled as "lower sodium" or "no salt added."  Eat fresh foods.  Eat more vegetables, fruits, and low-fat dairy products.  Choose whole grains. Look for the word "whole" as the first word in the ingredient list.  Choose fish and skinless chicken or [place] more often than red meat. Limit fish, poultry, and meat to 6 oz (170 g) each day.  Limit sweets, desserts, sugars, and sugary  drinks.  Choose heart-healthy  fats.  Limit cheese to 1 oz (28 g) per day.  Eat more home-cooked food and less restaurant, buffet, and fast food.  Limit fried foods.  Cook foods using methods other than frying.  Limit canned vegetables. If you do use them, rinse them well to decrease the sodium.  When eating at a restaurant, ask that your food be prepared with less salt, or no salt if possible. WHAT FOODS CAN I EAT? Seek help from a dietitian for individual calorie needs. Grains Whole grain or whole wheat bread. Brown rice. Whole grain or whole wheat pasta. Quinoa, bulgur, and whole grain cereals. Low-sodium cereals. Corn or whole wheat flour tortillas. Whole grain cornbread. Whole grain crackers. Low-sodium crackers. Vegetables Fresh or frozen vegetables (raw, steamed, roasted, or grilled). Low-sodium or reduced-sodium tomato and vegetable juices. Low-sodium or reduced-sodium tomato sauce and paste. Low-sodium or reduced-sodium canned vegetables.  Fruits All fresh, canned (in natural juice), or frozen fruits. Meat and Other Protein Products Ground beef (85% or leaner), grass-fed beef, or beef trimmed of fat. Skinless chicken or [place]. Ground chicken or [place]. Pork trimmed of fat. All fish and seafood. Eggs. Dried beans, peas, or lentils. Unsalted nuts and seeds. Unsalted canned beans. Dairy Low-fat dairy products, such as skim or 1% milk, 2% or reduced-fat cheeses, low-fat ricotta or cottage cheese, or plain low-fat yogurt. Low-sodium or reduced-sodium cheeses. Fats and Oils Tub margarines without trans fats. Light or reduced-fat mayonnaise and salad dressings (reduced sodium). Avocado. Safflower, olive, or canola oils. Natural peanut or almond butter. Other Unsalted popcorn and pretzels. The items listed above may not be a complete list of recommended foods or beverages. Contact your dietitian for more options. WHAT FOODS ARE NOT RECOMMENDED? Grains White bread. White pasta. White rice. Refined cornbread.  Bagels and croissants. Crackers that contain trans fat. Vegetables Creamed or fried vegetables. Vegetables in a cheese sauce. Regular canned vegetables. Regular canned tomato sauce and paste. Regular tomato and vegetable juices. Fruits Dried fruits. Canned fruit in light or heavy syrup. Fruit juice. Meat and Other Protein Products Fatty cuts of meat. Ribs, chicken wings, bacon, sausage, bologna, salami, chitterlings, fatback, hot dogs, bratwurst, and packaged luncheon meats. Salted nuts and seeds. Canned beans with salt. Dairy Whole or 2% milk, cream, half-and-half, and cream cheese. Whole-fat or sweetened yogurt. Full-fat cheeses or blue cheese. Nondairy creamers and whipped toppings. Processed cheese, cheese spreads, or cheese curds. Condiments Onion and garlic salt, seasoned salt, table salt, and sea salt. Canned and packaged gravies. Worcestershire sauce. Tartar sauce. Barbecue sauce. Teriyaki sauce. Soy sauce, including reduced sodium. Steak sauce. Fish sauce. Oyster sauce. Cocktail sauce. Horseradish. Ketchup and mustard. Meat flavorings and tenderizers. Bouillon cubes. Hot sauce. Tabasco sauce. Marinades. Taco seasonings. Relishes. Fats and Oils Butter, stick margarine, lard, shortening, ghee, and bacon fat. Coconut, palm kernel, or palm oils. Regular salad dressings. Other Pickles and olives. Salted popcorn and pretzels. The items listed above may not be a complete list of foods and beverages to avoid. Contact your dietitian for more information. WHERE CAN I FIND MORE INFORMATION? National Heart, Lung, and Blood Institute: travelstabloid.com   This information is not intended to replace advice given to you by your health care provider. Make sure you discuss any questions you have with your health care provider.   Document Released: 07/17/2011 Document Revised: 08/18/2014 Document Reviewed: 06/01/2013 Elsevier Interactive Patient Education [employer].

## 2016-01-24 NOTE — Assessment & Plan Note (Addendum)
Will stop caffeine after 3PM. Will work on sleep hygiene. Will check in by phone in 2 weeks. If not better, will start him on trazodone

## 2016-01-25 ENCOUNTER — Encounter: Payer: Self-pay | Admitting: Family Medicine

## 2016-01-25 LAB — COMPREHENSIVE METABOLIC PANEL
A/G RATIO: 1.8 (ref 1.2–2.2)
ALT: 15 IU/L (ref 0–44)
AST: 19 IU/L (ref 0–40)
Albumin: 4.1 g/dL (ref 3.5–5.5)
Alkaline Phosphatase: 47 IU/L (ref 39–117)
BILIRUBIN TOTAL: 0.5 mg/dL (ref 0.0–1.2)
BUN/Creatinine Ratio: 9 (ref 9–20)
BUN: 7 mg/dL (ref 6–24)
CALCIUM: 9.2 mg/dL (ref 8.7–10.2)
CHLORIDE: 102 mmol/L (ref 96–106)
CO2: 22 mmol/L (ref 18–29)
Creatinine, Ser: 0.8 mg/dL (ref 0.76–1.27)
GFR calc Af Amer: 126 mL/min/{1.73_m2} (ref >59)
GFR, EST NON AFRICAN AMERICAN: 109 mL/min/{1.73_m2} (ref >59)
Globulin, Total: 2.3 g/dL (ref 1.5–4.5)
Glucose: 119 mg/dL — ABNORMAL HIGH (ref 65–99)
POTASSIUM: 4.4 mmol/L (ref 3.5–5.2)
Sodium: 139 mmol/L (ref 134–144)
TOTAL PROTEIN: 6.4 g/dL (ref 6.0–8.5)

## 2016-01-25 LAB — CBC WITH DIFFERENTIAL/PLATELET
BASOS: 0 %
Basophils Absolute: 0 10*3/uL (ref 0.0–0.2)
EOS (ABSOLUTE): 0.1 10*3/uL (ref 0.0–0.4)
Eos: 1 %
HEMATOCRIT: 42.9 % (ref 37.5–51.0)
Hemoglobin: 14.6 g/dL (ref 12.6–17.7)
IMMATURE GRANS (ABS): 0 10*3/uL (ref 0.0–0.1)
Immature Granulocytes: 0 %
LYMPHS: 29 %
Lymphocytes Absolute: 2.5 10*3/uL (ref 0.7–3.1)
MCH: 34.9 pg — ABNORMAL HIGH (ref 26.6–33.0)
MCHC: 34 g/dL (ref 31.5–35.7)
MCV: 103 fL — ABNORMAL HIGH (ref 79–97)
Monocytes Absolute: 0.7 10*3/uL (ref 0.1–0.9)
Monocytes: 8 %
NEUTROS ABS: 5.3 10*3/uL (ref 1.4–7.0)
Neutrophils: 62 %
PLATELETS: 227 10*3/uL (ref 150–379)
RBC: 4.18 x10E6/uL (ref 4.14–5.80)
RDW: 13.3 % (ref 12.3–15.4)
WBC: 8.5 10*3/uL (ref 3.4–10.8)

## 2016-01-25 LAB — LIPID PANEL W/O CHOL/HDL RATIO
Cholesterol, Total: 201 mg/dL — ABNORMAL HIGH (ref 100–199)
HDL: 64 mg/dL (ref >39)
LDL Calculated: 124 mg/dL — ABNORMAL HIGH (ref 0–99)
TRIGLYCERIDES: 66 mg/dL (ref 0–149)
VLDL CHOLESTEROL CAL: 13 mg/dL (ref 5–40)

## 2016-01-25 LAB — TSH: TSH: 0.675 u[IU]/mL (ref 0.450–4.500)

## 2016-02-07 ENCOUNTER — Telehealth: Payer: Self-pay | Admitting: Family Medicine

## 2016-02-07 MED ORDER — TRAZODONE HCL 50 MG PO TABS: 25.0000 mg | ORAL_TABLET | Freq: Every evening | ORAL | Status: DC | PRN

## 2016-02-07 NOTE — Telephone Encounter (Signed)
Called patient to see how his sleep was doing. Avoiding caffeine in the evening and still not doing well. Having trouble falling and staying asleep. Will start him on trazodone. Call with any concerns. Recheck at follow up on 8/15.

## 2016-02-07 NOTE — Telephone Encounter (Signed)
-----   Message from Valerie Roys, DO sent at 01/24/2016 10:39 AM EDT ----- Call to check on his sleep

## 2016-02-11 ENCOUNTER — Telehealth: Payer: Self-pay

## 2016-02-11 MED ORDER — MONTELUKAST SODIUM 10 MG PO TABS: 10.0000 mg | ORAL_TABLET | Freq: Every day | ORAL | Status: DC

## 2016-02-11 NOTE — Telephone Encounter (Signed)
Rx sent to his pharmacy

## 2016-02-11 NOTE — Telephone Encounter (Signed)
Pharmacy is requesting new 90 day Rx for  Montelukast Sod 10 mg Tabs

## 2016-03-20 ENCOUNTER — Encounter: Payer: Self-pay | Admitting: Family Medicine

## 2016-03-20 ENCOUNTER — Ambulatory Visit (INDEPENDENT_AMBULATORY_CARE_PROVIDER_SITE_OTHER): Payer: BLUE CROSS/BLUE SHIELD | Admitting: Family Medicine

## 2016-03-20 DIAGNOSIS — G4700 Insomnia, unspecified: Secondary | ICD-10-CM

## 2016-03-20 DIAGNOSIS — I10 Essential (primary) hypertension: Secondary | ICD-10-CM

## 2016-03-20 MED ORDER — TRAZODONE HCL 50 MG PO TABS: 25.0000 mg | ORAL_TABLET | Freq: Every evening | ORAL | 1 refills | Status: DC | PRN

## 2016-03-20 MED ORDER — LISINOPRIL 5 MG PO TABS: 5.0000 mg | ORAL_TABLET | Freq: Every day | ORAL | 1 refills | Status: DC

## 2016-03-20 NOTE — Assessment & Plan Note (Signed)
Will start low dose lisinopril. Continue to work on stress and diet. Recheck 2 months. Call with concerns.

## 2016-03-20 NOTE — Assessment & Plan Note (Signed)
Under good control. Continue current regimen. Continue to monitor. Call with any concerns. 

## 2016-03-20 NOTE — Progress Notes (Signed)
BP (!) 153/93 (BP Location: Left Arm, Patient Position: Sitting, Cuff Size: Large)   Pulse 84   Temp 98.7 F (37.1 C)   Wt 182 lb (82.6 kg)   SpO2 97%   BMI 26.72 kg/m    Subjective:    Patient ID: Edward Mckenzie, male    DOB: February 23, 1971, 45 y.o.   MRN: [id number]  HPI: Edward Mckenzie is a 45 y.o. male  Chief Complaint  Patient presents with  . Hypertension  . Insomnia  . finger popping   ELEVATED BLOOD PRESSURE Duration of elevated BP: at least 6 months BP monitoring frequency: not checking Previous BP meds: no Recent stressors: yes Family history of hypertension: yes Recurrent headaches: no Visual changes: no Palpitations: no  Dyspnea: no Chest pain: no Lower extremity edema: no Dizzy/lightheaded: no Transient ischemic attacks: no  INSOMNIA- started on trazodone for sleep, seems like it's helping Duration: chronic Satisfied with sleep quality: yes Difficulty falling asleep: no Difficulty staying asleep: no Waking a few hours after sleep onset: no Early morning awakenings: no Daytime hypersomnolence: no Wakes feeling refreshed: no Good sleep hygiene: no Apnea: no Snoring: no Depressed/anxious mood: no Recent stress: yes Restless legs/nocturnal leg cramps: no Chronic pain/arthritis: no History of sleep study: no  Relevant past medical, surgical, family and social history reviewed and updated as indicated. Interim medical history since our last visit reviewed. Allergies and medications reviewed and updated.  Review of Systems  Constitutional: Negative.   Respiratory: Negative.   Cardiovascular: Negative.   Psychiatric/Behavioral: Negative.     Per HPI unless specifically indicated above     Objective:    BP (!) 153/93 (BP Location: Left Arm, Patient Position: Sitting, Cuff Size: Large)   Pulse 84   Temp 98.7 F (37.1 C)   Wt 182 lb (82.6 kg)   SpO2 97%   BMI 26.72 kg/m   Wt Readings from Last 3 Encounters:  03/20/16 182  lb (82.6 kg)  01/24/16 181 lb (82.1 kg)  12/20/15 175 lb (79.4 kg)    Physical Exam  Constitutional: He is oriented to person, place, and time. He appears well-developed and well-nourished. No distress.  HENT:  Head: Normocephalic and atraumatic.  Right Ear: Hearing normal.  Left Ear: Hearing normal.  Nose: Nose normal.  Eyes: Conjunctivae and lids are normal. Right eye exhibits no discharge. Left eye exhibits no discharge. No scleral icterus.  Cardiovascular: Normal rate, regular rhythm, normal heart sounds and intact distal pulses.  Exam reveals no gallop and no friction rub.   No murmur heard. Pulmonary/Chest: Effort normal and breath sounds normal. No respiratory distress. He has no wheezes. He has no rales. He exhibits no tenderness.  Musculoskeletal: Normal range of motion.  Neurological: He is alert and oriented to person, place, and time.  Skin: Skin is warm and intact. No rash noted. He is not diaphoretic. No erythema. No pallor.  Psychiatric: He has a normal mood and affect. His speech is normal and behavior is normal. Judgment and thought content normal. Cognition and memory are normal.  Nursing note and vitals reviewed.   Results for orders placed or performed in visit on 01/24/16  CBC with Differential/Platelet  Result Value Ref Range   WBC 8.5 3.4 - 10.8 x10E3/uL   RBC 4.18 4.14 - 5.80 x10E6/uL   Hemoglobin 14.6 12.6 - 17.7 g/dL   Hematocrit 42.9 37.5 - 51.0 %   MCV 103 (H) 79 - 97 fL   MCH 34.9 (H) 26.6 -  33.0 pg   MCHC 34.0 31.5 - 35.7 g/dL   RDW 13.3 12.3 - 15.4 %   Platelets 227 150 - 379 x10E3/uL   Neutrophils 62 %   Lymphs 29 %   Monocytes 8 %   Eos 1 %   Basos 0 %   Neutrophils Absolute 5.3 1.4 - 7.0 x10E3/uL   Lymphocytes Absolute 2.5 0.7 - 3.1 x10E3/uL   Monocytes Absolute 0.7 0.1 - 0.9 x10E3/uL   EOS (ABSOLUTE) 0.1 0.0 - 0.4 x10E3/uL   Basophils Absolute 0.0 0.0 - 0.2 x10E3/uL   Immature Granulocytes 0 %   Immature Grans (Abs) 0.0 0.0 - 0.1  x10E3/uL  Comprehensive metabolic panel  Result Value Ref Range   Glucose 119 (H) 65 - 99 mg/dL   BUN 7 6 - 24 mg/dL   Creatinine, Ser 0.80 0.76 - 1.27 mg/dL   GFR calc non Af Amer 109 >59 mL/min/1.73   GFR calc Af Amer 126 >59 mL/min/1.73   BUN/Creatinine Ratio 9 9 - 20   Sodium 139 134 - 144 mmol/L   Potassium 4.4 3.5 - 5.2 mmol/L   Chloride 102 96 - 106 mmol/L   CO2 22 18 - 29 mmol/L   Calcium 9.2 8.7 - 10.2 mg/dL   Total Protein 6.4 6.0 - 8.5 g/dL   Albumin 4.1 3.5 - 5.5 g/dL   Globulin, Total 2.3 1.5 - 4.5 g/dL   Albumin/Globulin Ratio 1.8 1.2 - 2.2   Bilirubin Total 0.5 0.0 - 1.2 mg/dL   Alkaline Phosphatase 47 39 - 117 IU/L   AST 19 0 - 40 IU/L   ALT 15 0 - 44 IU/L  Lipid Panel w/o Chol/HDL Ratio  Result Value Ref Range   Cholesterol, Total 201 (H) 100 - 199 mg/dL   Triglycerides 66 0 - 149 mg/dL   HDL 64 >39 mg/dL   VLDL Cholesterol Cal 13 5 - 40 mg/dL   LDL Calculated 124 (H) 0 - 99 mg/dL  TSH  Result Value Ref Range   TSH 0.675 0.450 - 4.500 uIU/mL  UA/M w/rflx Culture, Routine  Result Value Ref Range   Specific Gravity, UA <1.005 (L) 1.005 - 1.030   pH, UA 6.0 5.0 - 7.5   Color, UA Yellow Yellow   Appearance Ur Clear Clear   Leukocytes, UA Negative Negative   Protein, UA Negative Negative/Trace   Glucose, UA Negative Negative   Ketones, UA Negative Negative   RBC, UA Negative Negative   Bilirubin, UA Negative Negative   Urobilinogen, Ur 0.2 0.2 - 1.0 mg/dL   Nitrite, UA Negative Negative      Assessment & Plan:   Problem List Items Addressed This Visit      Cardiovascular and Mediastinum   HTN (hypertension)    Will start low dose lisinopril. Continue to work on stress and diet. Recheck 2 months. Call with concerns.       Relevant Medications   lisinopril (PRINIVIL,ZESTRIL) 5 MG tablet   Other Relevant Orders   Basic metabolic panel   Microalbumin, Urine Waived     Other   Insomnia    Under good control. Continue current regimen. Continue to  monitor. Call with any concerns.        Other Visit Diagnoses   None.      Follow up plan: Return in about 2 months (around 05/20/2016) for Follow up BP.

## 2016-03-21 ENCOUNTER — Ambulatory Visit: Payer: BLUE CROSS/BLUE SHIELD | Admitting: Family Medicine

## 2016-03-25 ENCOUNTER — Ambulatory Visit: Payer: BLUE CROSS/BLUE SHIELD | Admitting: Family Medicine

## 2016-03-27 ENCOUNTER — Other Ambulatory Visit: Payer: Self-pay | Admitting: Family Medicine

## 2016-03-29 ENCOUNTER — Other Ambulatory Visit: Payer: Self-pay | Admitting: Family Medicine

## 2016-03-31 ENCOUNTER — Telehealth: Payer: Self-pay | Admitting: Family Medicine

## 2016-03-31 MED ORDER — GABAPENTIN 300 MG PO CAPS: 300.0000 mg | ORAL_CAPSULE | Freq: Three times a day (TID) | ORAL | 1 refills | Status: DC

## 2016-03-31 NOTE — Telephone Encounter (Signed)
Pt called stated he needs a refill on Gabapentin. He has no further refills. Pharm is [facility] on [address] in [place]. Thanks.

## 2016-03-31 NOTE — Telephone Encounter (Signed)
Rx sent to his pharmacy

## 2016-04-15 ENCOUNTER — Other Ambulatory Visit: Payer: Self-pay

## 2016-04-21 ENCOUNTER — Telehealth: Payer: Self-pay | Admitting: Family Medicine

## 2016-04-21 MED ORDER — FLUTICASONE PROPIONATE 50 MCG/ACT NA SUSP: NASAL | 12 refills | Status: DC

## 2016-04-21 NOTE — Telephone Encounter (Signed)
Rx sent to his pharmacy

## 2016-04-21 NOTE — Telephone Encounter (Signed)
Pt needs refill for  fluticasone (FLONASE) 50 MCG/ACT nasal spray sent to [facility] [address].

## 2016-05-20 ENCOUNTER — Encounter: Payer: Self-pay | Admitting: Family Medicine

## 2016-05-20 ENCOUNTER — Ambulatory Visit (INDEPENDENT_AMBULATORY_CARE_PROVIDER_SITE_OTHER): Payer: BLUE CROSS/BLUE SHIELD | Admitting: Family Medicine

## 2016-05-20 VITALS — BP 126/80 | HR 88 | Temp 99.4°F | Wt 184.4 lb

## 2016-05-20 DIAGNOSIS — I10 Essential (primary) hypertension: Secondary | ICD-10-CM

## 2016-05-20 LAB — MICROALBUMIN, URINE WAIVED
CREATININE, URINE WAIVED: 50 mg/dL (ref 10–300)
Microalb, Ur Waived: 10 mg/L (ref 0–19)

## 2016-05-20 MED ORDER — LISINOPRIL 5 MG PO TABS: 5.0000 mg | ORAL_TABLET | Freq: Every day | ORAL | 1 refills | Status: DC

## 2016-05-20 NOTE — Progress Notes (Signed)
BP 126/80 (BP Location: Left Arm, Patient Position: Sitting, Cuff Size: Large)   Pulse 88   Temp 99.4 F (37.4 C)   Wt 184 lb 6.4 oz (83.6 kg)   SpO2 97%   BMI 27.07 kg/m    Subjective:    Patient ID: Edward Mckenzie, male    DOB: 28-Feb-1971, 45 y.o.   MRN: [id number]  HPI: Edward Mckenzie is a 45 y.o. male  Chief Complaint  Patient presents with  . Hypertension   HYPERTENSION Hypertension status: controlled  Satisfied with current treatment? yes Duration of hypertension: 1 month BP monitoring frequency:  not checking BP medication side effects:  no Medication compliance: excellent compliance Aspirin: no Recurrent headaches: no Visual changes: no Palpitations: no Dyspnea: no Chest pain: no Lower extremity edema: no Dizzy/lightheaded: no  Relevant past medical, surgical, family and social history reviewed and updated as indicated. Interim medical history since our last visit reviewed. Allergies and medications reviewed and updated.  Review of Systems  Constitutional: Negative.   Respiratory: Negative.   Cardiovascular: Negative.   Psychiatric/Behavioral: Negative.     Per HPI unless specifically indicated above     Objective:    BP 126/80 (BP Location: Left Arm, Patient Position: Sitting, Cuff Size: Large)   Pulse 88   Temp 99.4 F (37.4 C)   Wt 184 lb 6.4 oz (83.6 kg)   SpO2 97%   BMI 27.07 kg/m   Wt Readings from Last 3 Encounters:  05/20/16 184 lb 6.4 oz (83.6 kg)  03/20/16 182 lb (82.6 kg)  01/24/16 181 lb (82.1 kg)    Physical Exam  Constitutional: He is oriented to person, place, and time. He appears well-developed and well-nourished. No distress.  HENT:  Head: Normocephalic and atraumatic.  Right Ear: Hearing normal.  Left Ear: Hearing normal.  Nose: Nose normal.  Eyes: Conjunctivae and lids are normal. Right eye exhibits no discharge. Left eye exhibits no discharge. No scleral icterus.  Pulmonary/Chest: Effort normal. No  respiratory distress.  Musculoskeletal: Normal range of motion.  Neurological: He is alert and oriented to person, place, and time.  Skin: Skin is intact. No rash noted.  Psychiatric: He has a normal mood and affect. His speech is normal and behavior is normal. Judgment and thought content normal. Cognition and memory are normal.    Results for orders placed or performed in visit on 01/24/16  CBC with Differential/Platelet  Result Value Ref Range   WBC 8.5 3.4 - 10.8 x10E3/uL   RBC 4.18 4.14 - 5.80 x10E6/uL   Hemoglobin 14.6 12.6 - 17.7 g/dL   Hematocrit 42.9 37.5 - 51.0 %   MCV 103 (H) 79 - 97 fL   MCH 34.9 (H) 26.6 - 33.0 pg   MCHC 34.0 31.5 - 35.7 g/dL   RDW 13.3 12.3 - 15.4 %   Platelets 227 150 - 379 x10E3/uL   Neutrophils 62 %   Lymphs 29 %   Monocytes 8 %   Eos 1 %   Basos 0 %   Neutrophils Absolute 5.3 1.4 - 7.0 x10E3/uL   Lymphocytes Absolute 2.5 0.7 - 3.1 x10E3/uL   Monocytes Absolute 0.7 0.1 - 0.9 x10E3/uL   EOS (ABSOLUTE) 0.1 0.0 - 0.4 x10E3/uL   Basophils Absolute 0.0 0.0 - 0.2 x10E3/uL   Immature Granulocytes 0 %   Immature Grans (Abs) 0.0 0.0 - 0.1 x10E3/uL  Comprehensive metabolic panel  Result Value Ref Range   Glucose 119 (H) 65 - 99 mg/dL  BUN 7 6 - 24 mg/dL   Creatinine, Ser 0.80 0.76 - 1.27 mg/dL   GFR calc non Af Amer 109 >59 mL/min/1.73   GFR calc Af Amer 126 >59 mL/min/1.73   BUN/Creatinine Ratio 9 9 - 20   Sodium 139 134 - 144 mmol/L   Potassium 4.4 3.5 - 5.2 mmol/L   Chloride 102 96 - 106 mmol/L   CO2 22 18 - 29 mmol/L   Calcium 9.2 8.7 - 10.2 mg/dL   Total Protein 6.4 6.0 - 8.5 g/dL   Albumin 4.1 3.5 - 5.5 g/dL   Globulin, Total 2.3 1.5 - 4.5 g/dL   Albumin/Globulin Ratio 1.8 1.2 - 2.2   Bilirubin Total 0.5 0.0 - 1.2 mg/dL   Alkaline Phosphatase 47 39 - 117 IU/L   AST 19 0 - 40 IU/L   ALT 15 0 - 44 IU/L  Lipid Panel w/o Chol/HDL Ratio  Result Value Ref Range   Cholesterol, Total 201 (H) 100 - 199 mg/dL   Triglycerides 66 0 - 149 mg/dL    HDL 64 >39 mg/dL   VLDL Cholesterol Cal 13 5 - 40 mg/dL   LDL Calculated 124 (H) 0 - 99 mg/dL  TSH  Result Value Ref Range   TSH 0.675 0.450 - 4.500 uIU/mL  UA/M w/rflx Culture, Routine  Result Value Ref Range   Specific Gravity, UA <1.005 (L) 1.005 - 1.030   pH, UA 6.0 5.0 - 7.5   Color, UA Yellow Yellow   Appearance Ur Clear Clear   Leukocytes, UA Negative Negative   Protein, UA Negative Negative/Trace   Glucose, UA Negative Negative   Ketones, UA Negative Negative   RBC, UA Negative Negative   Bilirubin, UA Negative Negative   Urobilinogen, Ur 0.2 0.2 - 1.0 mg/dL   Nitrite, UA Negative Negative      Assessment & Plan:   Problem List Items Addressed This Visit      Cardiovascular and Mediastinum   HTN (hypertension) - Primary    Under good control. Continue current regimen. Rechecking BMP today. Await results. Call with any concerns.       Relevant Medications   lisinopril (PRINIVIL,ZESTRIL) 5 MG tablet    Other Visit Diagnoses   None.      Follow up plan: Return in about 6 months (around 11/18/2016) for Follow up.

## 2016-05-20 NOTE — Assessment & Plan Note (Signed)
Under good control. Continue current regimen. Rechecking BMP today. Await results. Call with any concerns.

## 2016-05-21 ENCOUNTER — Telehealth: Payer: Self-pay | Admitting: Family Medicine

## 2016-05-21 LAB — BASIC METABOLIC PANEL WITH GFR
BUN/Creatinine Ratio: 5 — ABNORMAL LOW (ref 9–20)
BUN: 4 mg/dL — ABNORMAL LOW (ref 6–24)
CO2: 20 mmol/L (ref 18–29)
Calcium: 9.3 mg/dL (ref 8.7–10.2)
Chloride: 93 mmol/L — ABNORMAL LOW (ref 96–106)
Creatinine, Ser: 0.82 mg/dL (ref 0.76–1.27)
GFR calc Af Amer: 124 mL/min/1.73
GFR calc non Af Amer: 108 mL/min/1.73
Glucose: 86 mg/dL (ref 65–99)
Potassium: 4.7 mmol/L (ref 3.5–5.2)
Sodium: 133 mmol/L — ABNORMAL LOW (ref 134–144)

## 2016-05-21 NOTE — Telephone Encounter (Signed)
Please let him know that his labs look good, but his sodium was a little low, so he can have a little bit more salt. Thanks!

## 2016-05-22 NOTE — Telephone Encounter (Signed)
Left message, letting patient know that his labs were normal, just increase his salt intake.

## 2016-07-31 ENCOUNTER — Other Ambulatory Visit: Payer: Self-pay | Admitting: Family Medicine

## 2016-09-17 ENCOUNTER — Other Ambulatory Visit: Payer: Self-pay | Admitting: Family Medicine

## 2016-09-18 NOTE — Telephone Encounter (Signed)
Routing to provider. Appt 11/18/16.

## 2016-10-21 ENCOUNTER — Other Ambulatory Visit: Payer: Self-pay | Admitting: Family Medicine

## 2016-10-22 NOTE — Telephone Encounter (Signed)
Routing to provider. Appt on 11/18/16.

## 2016-11-18 ENCOUNTER — Ambulatory Visit (INDEPENDENT_AMBULATORY_CARE_PROVIDER_SITE_OTHER): Payer: BLUE CROSS/BLUE SHIELD | Admitting: Family Medicine

## 2016-11-18 VITALS — BP 136/85 | Temp 98.4°F | Wt 193.4 lb

## 2016-11-18 DIAGNOSIS — I10 Essential (primary) hypertension: Secondary | ICD-10-CM | POA: Diagnosis not present

## 2016-11-18 DIAGNOSIS — G4700 Insomnia, unspecified: Secondary | ICD-10-CM

## 2016-11-18 MED ORDER — LISINOPRIL 5 MG PO TABS: 5.0000 mg | ORAL_TABLET | Freq: Every day | ORAL | 1 refills | Status: DC

## 2016-11-18 NOTE — Assessment & Plan Note (Signed)
Better on recheck. Continue current regimen. Recheck 6 months. BMP today.

## 2016-11-18 NOTE — Progress Notes (Signed)
BP 136/85   Temp 98.4 F (36.9 C)   Wt 193 lb 6.4 oz (87.7 kg) Comment: with boots  SpO2 98%   BMI 28.40 kg/m    Subjective:    Patient ID: Edward Mckenzie, male    DOB: [birth year], 45 y.o.   MRN: [id number]  HPI: Edward Mckenzie is a 46 y.o. male  Chief Complaint  Patient presents with  . Hypertension   HYPERTENSION Hypertension status: controlled  Satisfied with current treatment? yes Duration of hypertension: chronic BP monitoring frequency:  not checking BP medication side effects:  no Medication compliance: excellent compliance Previous BP meds: lisinopril Aspirin: no Recurrent headaches: no Visual changes: no Palpitations: no Dyspnea: no Chest pain: no Lower extremity edema: no Dizzy/lightheaded: no   INSOMNIA Duration: chronic Satisfied with sleep quality: yes Difficulty falling asleep: no Difficulty staying asleep: no Waking a few hours after sleep onset: no Early morning awakenings: no Daytime hypersomnolence: no Wakes feeling refreshed: yes Good sleep hygiene: no Apnea: no Snoring: no Depressed/anxious mood: no Recent stress: no Restless legs/nocturnal leg cramps: no Chronic pain/arthritis: no History of sleep study: no Treatments attempted: trazodone    Relevant past medical, surgical, family and social history reviewed and updated as indicated. Interim medical history since our last visit reviewed. Allergies and medications reviewed and updated.  Review of Systems  Constitutional: Negative.   Respiratory: Negative.   Cardiovascular: Negative.   Psychiatric/Behavioral: Negative.     Per HPI unless specifically indicated above     Objective:    BP 136/85   Temp 98.4 F (36.9 C)   Wt 193 lb 6.4 oz (87.7 kg) Comment: with boots  SpO2 98%   BMI 28.40 kg/m   Wt Readings from Last 3 Encounters:  11/18/16 193 lb 6.4 oz (87.7 kg)  05/20/16 184 lb 6.4 oz (83.6 kg)  03/20/16 182 lb (82.6 kg)    Physical Exam    Constitutional: He is oriented to person, place, and time. He appears well-developed and well-nourished. No distress.  HENT:  Head: Normocephalic and atraumatic.  Right Ear: Hearing normal.  Left Ear: Hearing normal.  Nose: Nose normal.  Eyes: Conjunctivae and lids are normal. Right eye exhibits no discharge. Left eye exhibits no discharge. No scleral icterus.  Cardiovascular: Normal rate, regular rhythm, normal heart sounds and intact distal pulses.  Exam reveals no gallop and no friction rub.   No murmur heard. Pulmonary/Chest: Effort normal and breath sounds normal. No respiratory distress. He has no wheezes. He has no rales. He exhibits no tenderness.  Musculoskeletal: Normal range of motion.  Neurological: He is alert and oriented to person, place, and time.  Skin: Skin is warm, dry and intact. No rash noted. No erythema. No pallor.  Psychiatric: He has a normal mood and affect. His speech is normal and behavior is normal. Judgment and thought content normal. Cognition and memory are normal.  Nursing note and vitals reviewed.   Results for orders placed or performed in visit on 70/35/00  Basic metabolic panel  Result Value Ref Range   Glucose 86 65 - 99 mg/dL   BUN 4 (L) 6 - 24 mg/dL   Creatinine, Ser 0.82 0.76 - 1.27 mg/dL   GFR calc non Af Amer 108 >59 mL/min/1.73   GFR calc Af Amer 124 >59 mL/min/1.73   BUN/Creatinine Ratio 5 (L) 9 - 20   Sodium 133 (L) 134 - 144 mmol/L   Potassium 4.7 3.5 - 5.2 mmol/L   Chloride 93 (  L) 96 - 106 mmol/L   CO2 20 18 - 29 mmol/L   Calcium 9.3 8.7 - 10.2 mg/dL  Microalbumin, Urine Waived  Result Value Ref Range   Microalb, Ur Waived 10 0 - 19 mg/L   Creatinine, Urine Waived 50 10 - 300 mg/dL   Microalb/Creat Ratio <30 <30 mg/g      Assessment & Plan:   Problem List Items Addressed This Visit      Cardiovascular and Mediastinum   HTN (hypertension) - Primary    Better on recheck. Continue current regimen. Recheck 6 months. BMP  today.      Relevant Medications   lisinopril (PRINIVIL,ZESTRIL) 5 MG tablet   Other Relevant Orders   Basic metabolic panel     Other   Insomnia    Stable. Under fair control with medication. Recheck 6 months.          Follow up plan: Return in about 6 months (around 05/20/2017) for Physical.

## 2016-11-18 NOTE — Assessment & Plan Note (Signed)
Stable. Under fair control with medication. Recheck 6 months.

## 2016-11-19 ENCOUNTER — Encounter: Payer: Self-pay | Admitting: Family Medicine

## 2016-11-19 LAB — BASIC METABOLIC PANEL
BUN / CREAT RATIO: 12 (ref 9–20)
BUN: 9 mg/dL (ref 6–24)
CO2: 25 mmol/L (ref 18–29)
CREATININE: 0.76 mg/dL (ref 0.76–1.27)
Calcium: 9.4 mg/dL (ref 8.7–10.2)
Chloride: 102 mmol/L (ref 96–106)
GFR calc Af Amer: 127 mL/min/{1.73_m2} (ref >59)
GFR, EST NON AFRICAN AMERICAN: 110 mL/min/{1.73_m2} (ref >59)
GLUCOSE: 106 mg/dL — AB (ref 65–99)
Potassium: 4.3 mmol/L (ref 3.5–5.2)
Sodium: 141 mmol/L (ref 134–144)

## 2017-01-19 ENCOUNTER — Other Ambulatory Visit: Payer: Self-pay | Admitting: Family Medicine

## 2017-01-27 ENCOUNTER — Encounter: Payer: Self-pay | Admitting: Family Medicine

## 2017-01-27 ENCOUNTER — Ambulatory Visit (INDEPENDENT_AMBULATORY_CARE_PROVIDER_SITE_OTHER): Payer: BLUE CROSS/BLUE SHIELD | Admitting: Family Medicine

## 2017-01-27 VITALS — BP 132/83 | HR 88 | Temp 99.0°F | Wt 193.4 lb

## 2017-01-27 DIAGNOSIS — L237 Allergic contact dermatitis due to plants, except food: Secondary | ICD-10-CM

## 2017-01-27 DIAGNOSIS — R591 Generalized enlarged lymph nodes: Secondary | ICD-10-CM | POA: Diagnosis not present

## 2017-01-27 MED ORDER — PREDNISONE 10 MG PO TABS
ORAL_TABLET | ORAL | 0 refills | Status: DC
Start: 2017-01-27 — End: 2017-05-21

## 2017-01-27 NOTE — Progress Notes (Signed)
BP 132/83 (BP Location: Left Arm, Patient Position: Sitting, Cuff Size: Normal)   Pulse 88   Temp 99 F (37.2 C)   Wt 193 lb 6 oz (87.7 kg)   SpO2 99%   BMI 28.39 kg/m    Subjective:    Patient ID: Edward Mckenzie, male    DOB: 1971-01-10, 46 y.o.   MRN: [id number]  HPI: Edward Mckenzie is a 46 y.o. male  Chief Complaint  Patient presents with  . Mass    Patient found a lump on his throat yesterday   RASH Duration:  days  Location: face  Itching: yes Burning: yes Redness: yes Oozing: yes Scaling: yes Blisters: no Painful: no Fevers: no Change in detergents/soaps/personal care products: no Recent illness: no Recent travel:no History of same: yes Context: stable Alleviating factors: nothing Treatments attempted:lotion/moisturizer Shortness of breath: no  Throat/tongue swelling: no Myalgias/arthralgias: no   LUMP Duration: 2 days Location: under his chin Onset: sudden Painful: no Discomfort: no Status:  bigger Trauma: no Redness: no Bruising: no Recent infection: yes Swollen lymph nodes: yes Requesting removal: no   Relevant past medical, surgical, family and social history reviewed and updated as indicated. Interim medical history since our last visit reviewed. Allergies and medications reviewed and updated.  Review of Systems  Constitutional: Negative.   Respiratory: Negative.   Cardiovascular: Negative.   Skin: Positive for rash. Negative for color change, pallor and wound.  Psychiatric/Behavioral: Negative.     Per HPI unless specifically indicated above     Objective:    BP 132/83 (BP Location: Left Arm, Patient Position: Sitting, Cuff Size: Normal)   Pulse 88   Temp 99 F (37.2 C)   Wt 193 lb 6 oz (87.7 kg)   SpO2 99%   BMI 28.39 kg/m   Wt Readings from Last 3 Encounters:  01/27/17 193 lb 6 oz (87.7 kg)  11/18/16 193 lb 6.4 oz (87.7 kg)  05/20/16 184 lb 6.4 oz (83.6 kg)    Physical Exam  Constitutional: He is  oriented to person, place, and time. He appears well-developed and well-nourished. No distress.  HENT:  Head: Normocephalic and atraumatic.  Right Ear: Hearing normal.  Left Ear: Hearing normal.  Nose: Nose normal.  Submandibular lymphadenopathy  Eyes: Conjunctivae and lids are normal. Right eye exhibits no discharge. Left eye exhibits no discharge. No scleral icterus.  Neck: Normal range of motion. Neck supple. No JVD present. No tracheal deviation present. No thyromegaly present.  Pulmonary/Chest: Effort normal. No stridor. No respiratory distress.  Musculoskeletal: Normal range of motion.  Lymphadenopathy:    He has no cervical adenopathy.  Neurological: He is alert and oriented to person, place, and time.  Skin: Skin is warm, dry and intact. Rash noted. No erythema. No pallor.  Psychiatric: He has a normal mood and affect. His speech is normal and behavior is normal. Judgment and thought content normal. Cognition and memory are normal.  Nursing note and vitals reviewed.   Results for orders placed or performed in visit on 10/93/23  Basic metabolic panel  Result Value Ref Range   Glucose 106 (H) 65 - 99 mg/dL   BUN 9 6 - 24 mg/dL   Creatinine, Ser 0.76 0.76 - 1.27 mg/dL   GFR calc non Af Amer 110 >59 mL/min/1.73   GFR calc Af Amer 127 >59 mL/min/1.73   BUN/Creatinine Ratio 12 9 - 20   Sodium 141 134 - 144 mmol/L   Potassium 4.3 3.5 - 5.2 mmol/L  Chloride 102 96 - 106 mmol/L   CO2 25 18 - 29 mmol/L   Calcium 9.4 8.7 - 10.2 mg/dL      Assessment & Plan:   Problem List Items Addressed This Visit    None    Visit Diagnoses    Poison ivy    -  Primary   Will treat with prednisone, let us know if not getting better or getting worse.    Lymphadenopathy of head and neck       Likely due to the poison ivy, let us know if it doesn't resolve.        Follow up plan: Return if symptoms worsen or fail to improve.

## 2017-01-27 NOTE — Patient Instructions (Addendum)
Lymphadenopathy Lymphadenopathy refers to swollen or enlarged lymph glands, also called lymph nodes. Lymph glands are part of your body's defense (immune) system, which protects the body from infections, germs, and diseases. Lymph glands are found in many locations in your body, including the neck, underarm, and groin. Many things can cause lymph glands to become enlarged. When your immune system responds to germs, such as viruses or bacteria, infection-fighting cells and fluid build up. This causes the glands to grow in size. Usually, this is not something to worry about. The swelling and any soreness often go away without treatment. However, swollen lymph glands can also be caused by a number of diseases. Your health care provider may do various tests to help determine the cause. If the cause of your swollen lymph glands cannot be found, it is important to monitor your condition to make sure the swelling goes away. Follow these instructions at home: Watch your condition for any changes. The following actions may help to lessen any discomfort you are feeling:  Get plenty of rest.  Take medicines only as directed by your health care provider. Your health care provider may recommend over-the-counter medicines for pain.  Apply moist heat compresses to the site of swollen lymph nodes as directed by your health care provider. This can help reduce any pain.  Check your lymph nodes daily for any changes.  Keep all follow-up visits as directed by your health care provider. This is important.  Contact a health care provider if:  Your lymph nodes are still swollen after 2 weeks.  Your swelling increases or spreads to other areas.  Your lymph nodes are hard, seem fixed to the skin, or are growing rapidly.  Your skin over the lymph nodes is red and inflamed.  You have a fever.  You have chills.  You have fatigue.  You develop a sore throat.  You have abdominal pain.  You have weight  loss.  You have night sweats. Get help right away if:  You notice fluid leaking from the area of the enlarged lymph node.  You have severe pain in any area of your body.  You have chest pain.  You have shortness of breath. This information is not intended to replace advice given to you by your health care provider. Make sure you discuss any questions you have with your health care provider. Document Released: 05/06/2008 Document Revised: 01/03/2016 Document Reviewed: 03/02/2014 Elsevier Interactive Patient Education  2018 [employer]  

## 2017-02-09 ENCOUNTER — Telehealth: Payer: Self-pay | Admitting: Family Medicine

## 2017-02-09 MED ORDER — MUPIROCIN 2 % EX OINT: 1.0000 "application " | TOPICAL_OINTMENT | Freq: Two times a day (BID) | CUTANEOUS | 0 refills | Status: DC

## 2017-02-09 NOTE — Telephone Encounter (Signed)
Call pt 

## 2017-02-09 NOTE — Telephone Encounter (Signed)
Phone call Discussed with patient still have skin lesions not seen his neck is gotten better will call in Bactroban ointment.

## 2017-02-09 NOTE — Telephone Encounter (Signed)
Patient was transferred to provider for telephone conversation.   

## 2017-02-09 NOTE — Telephone Encounter (Signed)
Routing to providers for advice. Will route to Dr. Jeananne Rama as well since Dr. Wynetta Emery is not in the office.

## 2017-02-23 ENCOUNTER — Other Ambulatory Visit: Payer: Self-pay | Admitting: Family Medicine

## 2017-03-07 ENCOUNTER — Other Ambulatory Visit: Payer: Self-pay | Admitting: Family Medicine

## 2017-04-16 ENCOUNTER — Telehealth: Payer: Self-pay | Admitting: Family Medicine

## 2017-04-16 MED ORDER — TRAZODONE HCL 50 MG PO TABS: 25.0000 mg | ORAL_TABLET | Freq: Every evening | ORAL | 0 refills | Status: DC | PRN

## 2017-04-16 NOTE — Telephone Encounter (Signed)
Routing to provider  

## 2017-04-21 ENCOUNTER — Other Ambulatory Visit: Payer: Self-pay | Admitting: Family Medicine

## 2017-04-22 ENCOUNTER — Other Ambulatory Visit: Payer: Self-pay | Admitting: Family Medicine

## 2017-05-20 ENCOUNTER — Encounter: Payer: BLUE CROSS/BLUE SHIELD | Admitting: Family Medicine

## 2017-05-21 ENCOUNTER — Ambulatory Visit (INDEPENDENT_AMBULATORY_CARE_PROVIDER_SITE_OTHER): Payer: BLUE CROSS/BLUE SHIELD | Admitting: Family Medicine

## 2017-05-21 ENCOUNTER — Encounter: Payer: Self-pay | Admitting: Family Medicine

## 2017-05-21 VITALS — BP 110/80 | HR 88 | Temp 98.7°F | Ht 67.7 in | Wt 183.4 lb

## 2017-05-21 DIAGNOSIS — G4700 Insomnia, unspecified: Secondary | ICD-10-CM | POA: Diagnosis not present

## 2017-05-21 DIAGNOSIS — I10 Essential (primary) hypertension: Secondary | ICD-10-CM

## 2017-05-21 DIAGNOSIS — Z0000 Encounter for general adult medical examination without abnormal findings: Secondary | ICD-10-CM

## 2017-05-21 DIAGNOSIS — L719 Rosacea, unspecified: Secondary | ICD-10-CM | POA: Diagnosis not present

## 2017-05-21 DIAGNOSIS — J301 Allergic rhinitis due to pollen: Secondary | ICD-10-CM

## 2017-05-21 LAB — UA/M W/RFLX CULTURE, ROUTINE
Bilirubin, UA: NEGATIVE
GLUCOSE, UA: NEGATIVE
Ketones, UA: NEGATIVE
LEUKOCYTES UA: NEGATIVE
Nitrite, UA: NEGATIVE
PROTEIN UA: NEGATIVE
RBC, UA: NEGATIVE
Specific Gravity, UA: <1.005 — ABNORMAL LOW (ref 1.005–1.030)
UUROB: 0.2 mg/dL (ref 0.2–1.0)
pH, UA: 7 (ref 5.0–7.5)

## 2017-05-21 LAB — MICROSCOPIC EXAMINATION
BACTERIA UA: NONE SEEN
RBC, UA: NONE SEEN /hpf (ref 0–?)

## 2017-05-21 LAB — MICROALBUMIN, URINE WAIVED
CREATININE, URINE WAIVED: 10 mg/dL (ref 10–300)
MICROALB, UR WAIVED: 10 mg/L (ref 0–19)

## 2017-05-21 MED ORDER — GABAPENTIN 300 MG PO CAPS: 300.0000 mg | ORAL_CAPSULE | Freq: Three times a day (TID) | ORAL | 1 refills | Status: DC

## 2017-05-21 MED ORDER — LISINOPRIL 5 MG PO TABS: 5.0000 mg | ORAL_TABLET | Freq: Every day | ORAL | 1 refills | Status: DC

## 2017-05-21 MED ORDER — METRONIDAZOLE 1 % EX GEL: Freq: Every day | CUTANEOUS | 12 refills | Status: DC

## 2017-05-21 MED ORDER — TRAZODONE HCL 50 MG PO TABS: 25.0000 mg | ORAL_TABLET | Freq: Every evening | ORAL | 1 refills | Status: DC | PRN

## 2017-05-21 MED ORDER — FLUTICASONE PROPIONATE 50 MCG/ACT NA SUSP: NASAL | 12 refills | Status: DC

## 2017-05-21 MED ORDER — MONTELUKAST SODIUM 10 MG PO TABS: 10.0000 mg | ORAL_TABLET | Freq: Every day | ORAL | 2 refills | Status: DC

## 2017-05-21 NOTE — Assessment & Plan Note (Signed)
Better on recheck. Continue current regimen. Continue to monitor. Call with any concerns. Refills given.  

## 2017-05-21 NOTE — Progress Notes (Signed)
BP 110/80   Pulse 88   Temp 98.7 F (37.1 C)   Ht 5' 7.7" (1.72 m)   Wt 183 lb 7 oz (83.2 kg)   SpO2 98%   BMI 28.14 kg/m    Subjective:    Patient ID: Edward Mckenzie, male    DOB: July 18, 1971, 46 y.o.   MRN: [id number]  HPI: Edward Mckenzie is a 46 y.o. male presenting on 05/21/2017 for comprehensive medical examination. Current medical complaints include:  HYPERTENSION Hypertension status: stable  Satisfied with current treatment? yes Duration of hypertension: chronic BP monitoring frequency:  rarely BP medication side effects:  no Medication compliance: excellent compliance Previous BP meds: lisinopril Aspirin: no Recurrent headaches: no Visual changes: no Palpitations: no Dyspnea: no Chest pain: no Lower extremity edema: no Dizzy/lightheaded: no  Interim Problems from his last visit: no  Depression Screen done today and results listed below:  Depression screen [facility] 2/9 05/21/2017 01/24/2016  Decreased Interest 0 0  Down, Depressed, Hopeless 0 0  PHQ - 2 Score 0 0  Altered sleeping - 3  Tired, decreased energy - 2  Change in appetite - 0  Feeling bad or failure about yourself  - 0  Trouble concentrating - 0  Moving slowly or fidgety/restless - 0  Suicidal thoughts - 0  PHQ-9 Score - 5    Past Medical History:  Past Medical History:  Diagnosis Date  . Carpal tunnel syndrome of right wrist   . Jones fracture    left foot  . Wears contact lenses     Surgical History:  Past Surgical History:  Procedure Laterality Date  . BACK SURGERY    . KNEE SURGERY    . NASAL SINUS SURGERY  02/22/98   UPPP, tonguepexy, septo.  Dr. Kathyrn Sheriff. [place]  . ORIF TOE FRACTURE Left 12/20/2015   Procedure: OPEN REDUCTION INTERNAL FIXATION (ORIF) 5TH METATARSAL (TOE) FRACTURE LEFT;  Surgeon: Albertine Patricia, DPM;  Location[place];  Service: Podiatry;  Laterality: Left;  LMA WITH POPLITEAL  . TONSILLECTOMY AND ADENOIDECTOMY      Medications:    Current Outpatient Prescriptions on File Prior to Visit  Medication Sig  . mupirocin ointment (BACTROBAN) 2 % Apply 1 application topically 2 (two) times daily.  . Omega-3 Fatty Acids (FISH OIL) 1000 MG CAPS Take 1,000 mg by mouth daily.  Marland Kitchen thiamine (VITAMIN B-1) 100 MG tablet Take 100 mg by mouth daily.   No current facility-administered medications on file prior to visit.     Allergies:  No Known Allergies  Social History:  Social History   Social History  . Marital status: Single    Spouse name: N/A  . Number of children: N/A  . Years of education: N/A   Occupational History  . Not on file.   Social History Main Topics  . Smoking status: Current Every Day Smoker    Packs/day: 1.00    Types: Cigarettes  . Smokeless tobacco: Former Systems developer  . Alcohol use 0.0 oz/week     Comment: pt states "recently quit".  . Drug use: No  . Sexual activity: Yes   Other Topics Concern  . Not on file   Social History Narrative  . No narrative on file   History  Smoking Status  . Current Every Day Smoker  . Packs/day: 1.00  . Types: Cigarettes  Smokeless Tobacco  . Former User   History  Alcohol Use  . 0.0 oz/week    Comment: pt states "recently  quit".    Family History:  Family History  Problem Relation Age of Onset  . Heart disease Father   . Diabetes Paternal Grandfather   . Heart disease Paternal Grandfather     Past medical history, surgical history, medications, allergies, family history and social history reviewed with patient today and changes made to appropriate areas of the chart.   Review of Systems  Constitutional: Negative.   HENT: Negative.   Eyes: Negative.   Respiratory: Negative.   Cardiovascular: Negative.   Gastrointestinal: Negative.   Genitourinary: Negative.   Musculoskeletal: Negative.   Skin: Negative.   Neurological: Negative.   Endo/Heme/Allergies: Positive for environmental allergies. Negative for polydipsia. Does not bruise/bleed  easily.  Psychiatric/Behavioral: Negative.     All other ROS negative except what is listed above and in the HPI.      Objective:    BP 110/80   Pulse 88   Temp 98.7 F (37.1 C)   Ht 5' 7.7" (1.72 m)   Wt 183 lb 7 oz (83.2 kg)   SpO2 98%   BMI 28.14 kg/m   Wt Readings from Last 3 Encounters:  05/21/17 183 lb 7 oz (83.2 kg)  01/27/17 193 lb 6 oz (87.7 kg)  11/18/16 193 lb 6.4 oz (87.7 kg)    Physical Exam  Constitutional: He is oriented to person, place, and time. He appears well-developed and well-nourished. No distress.  HENT:  Head: Normocephalic and atraumatic.  Right Ear: Hearing and external ear normal.  Left Ear: Hearing and external ear normal.  Nose: Nose normal.  Mouth/Throat: Oropharynx is clear and moist. No oropharyngeal exudate.  Eyes: Pupils are equal, round, and reactive to light. Conjunctivae, EOM and lids are normal. Right eye exhibits no discharge. Left eye exhibits no discharge. No scleral icterus.  Neck: Normal range of motion. Neck supple. No JVD present. No tracheal deviation present. No thyromegaly present.  Cardiovascular: Normal rate, regular rhythm, normal heart sounds and intact distal pulses.  Exam reveals no gallop and no friction rub.   No murmur heard. Pulmonary/Chest: Effort normal and breath sounds normal. No stridor. No respiratory distress. He has no wheezes. He has no rales. He exhibits no tenderness.  Abdominal: Soft. Bowel sounds are normal. He exhibits no distension and no mass. There is no tenderness. There is no rebound and no guarding.  Genitourinary:  Genitourinary Comments: Genital exam deferred at patient's request  Musculoskeletal: Normal range of motion. He exhibits no tenderness or deformity.  Lymphadenopathy:    He has no cervical adenopathy.  Neurological: He is alert and oriented to person, place, and time. He has normal reflexes. He displays normal reflexes. No cranial nerve deficit. He exhibits normal muscle tone.  Coordination normal.  Skin: Skin is warm, dry and intact. No rash noted. He is not diaphoretic. No erythema. No pallor.  Rosacea on cheeks  Psychiatric: He has a normal mood and affect. His speech is normal and behavior is normal. Judgment and thought content normal. Cognition and memory are normal.  Nursing note and vitals reviewed.   Results for orders placed or performed in visit on 29/92/42  Basic metabolic panel  Result Value Ref Range   Glucose 106 (H) 65 - 99 mg/dL   BUN 9 6 - 24 mg/dL   Creatinine, Ser 0.76 0.76 - 1.27 mg/dL   GFR calc non Af Amer 110 >59 mL/min/1.73   GFR calc Af Amer 127 >59 mL/min/1.73   BUN/Creatinine Ratio 12 9 - 20   Sodium  141 134 - 144 mmol/L   Potassium 4.3 3.5 - 5.2 mmol/L   Chloride 102 96 - 106 mmol/L   CO2 25 18 - 29 mmol/L   Calcium 9.4 8.7 - 10.2 mg/dL      Assessment & Plan:   Problem List Items Addressed This Visit      Cardiovascular and Mediastinum   HTN (hypertension)    Better on recheck. Continue current regimen. Continue to monitor. Call with any concerns. Refills given.       Relevant Medications   lisinopril (PRINIVIL,ZESTRIL) 5 MG tablet   Other Relevant Orders   Comprehensive metabolic panel   Microalbumin, Urine Waived     Respiratory   Allergic rhinitis    Stable. Continue current regimen. Refills given.         Musculoskeletal and Integument   Rosacea    Will treat with metrogel. Rx sent to his pharmacy. Call with any concerns.         Other   Insomnia    Stable. Continue current regimen. Refills given.        Other Visit Diagnoses    Routine general medical examination at a health care facility    -  Primary   Vaccines up to date/declined. Screening labs checked today. Continue diet and exercise. Call with any concerns.    Relevant Orders   CBC with Differential/Platelet   Comprehensive metabolic panel   Lipid Panel w/o Chol/HDL Ratio   Microalbumin, Urine Waived   TSH   UA/M w/rflx Culture,  Routine      LABORATORY TESTING:  Health maintenance labs ordered today as discussed above.   IMMUNIZATIONS:   - Tdap: Tetanus vaccination status reviewed: last tetanus booster within 10 years. - Influenza: Refused - Pneumovax: Refused  PATIENT COUNSELING:    Sexuality: Discussed sexually transmitted diseases, partner selection, use of condoms, avoidance of unintended pregnancy  and contraceptive alternatives.   Advised to avoid cigarette smoking.  I discussed with the patient that most people either abstain from alcohol or drink within safe limits (<=14/week and <=4 drinks/occasion for males, <=7/weeks and <= 3 drinks/occasion for females) and that the risk for alcohol disorders and other health effects rises proportionally with the number of drinks per week and how often a drinker exceeds daily limits.  Discussed cessation/primary prevention of drug use and availability of treatment for abuse.   Diet: Encouraged to adjust caloric intake to maintain  or achieve ideal body weight, to reduce intake of dietary saturated fat and total fat, to limit sodium intake by avoiding high sodium foods and not adding table salt, and to maintain adequate dietary potassium and calcium preferably from fresh fruits, vegetables, and low-fat dairy products.    stressed the importance of regular exercise  Injury prevention: Discussed safety belts, safety helmets, smoke detector, smoking near bedding or upholstery.   Dental health: Discussed importance of regular tooth brushing, flossing, and dental visits.   Follow up plan: NEXT PREVENTATIVE PHYSICAL DUE IN 1 YEAR. Return in about 6 months (around 11/19/2017) for Follow up BP.

## 2017-05-21 NOTE — Assessment & Plan Note (Signed)
Will treat with metrogel. Rx sent to his pharmacy. Call with any concerns.

## 2017-05-21 NOTE — Assessment & Plan Note (Signed)
Stable.  Continue current regimen.  Refills given. 

## 2017-05-22 LAB — COMPREHENSIVE METABOLIC PANEL
A/G RATIO: 1.7 (ref 1.2–2.2)
ALK PHOS: 52 IU/L (ref 39–117)
ALT: 21 IU/L (ref 0–44)
AST: 28 IU/L (ref 0–40)
Albumin: 4.5 g/dL (ref 3.5–5.5)
BILIRUBIN TOTAL: 0.3 mg/dL (ref 0.0–1.2)
BUN / CREAT RATIO: 9 (ref 9–20)
BUN: 6 mg/dL (ref 6–24)
CHLORIDE: 99 mmol/L (ref 96–106)
CO2: 23 mmol/L (ref 20–29)
Calcium: 9.7 mg/dL (ref 8.7–10.2)
Creatinine, Ser: 0.64 mg/dL — ABNORMAL LOW (ref 0.76–1.27)
GFR calc Af Amer: 137 mL/min/{1.73_m2} (ref >59)
GFR calc non Af Amer: 118 mL/min/{1.73_m2} (ref >59)
GLOBULIN, TOTAL: 2.6 g/dL (ref 1.5–4.5)
Glucose: 85 mg/dL (ref 65–99)
POTASSIUM: 4.8 mmol/L (ref 3.5–5.2)
SODIUM: 139 mmol/L (ref 134–144)
Total Protein: 7.1 g/dL (ref 6.0–8.5)

## 2017-05-22 LAB — CBC WITH DIFFERENTIAL/PLATELET
Basophils Absolute: 0 10*3/uL (ref 0.0–0.2)
Basos: 0 %
EOS (ABSOLUTE): 0.1 10*3/uL (ref 0.0–0.4)
Eos: 1 %
Hematocrit: 45 % (ref 37.5–51.0)
Hemoglobin: 15.1 g/dL (ref 13.0–17.7)
Immature Grans (Abs): 0 10*3/uL (ref 0.0–0.1)
Immature Granulocytes: 0 %
LYMPHS ABS: 3 10*3/uL (ref 0.7–3.1)
Lymphs: 39 %
MCH: 34.8 pg — AB (ref 26.6–33.0)
MCHC: 33.6 g/dL (ref 31.5–35.7)
MCV: 104 fL — AB (ref 79–97)
MONOS ABS: 0.6 10*3/uL (ref 0.1–0.9)
Monocytes: 8 %
NEUTROS ABS: 4 10*3/uL (ref 1.4–7.0)
Neutrophils: 52 %
Platelets: 243 10*3/uL (ref 150–379)
RBC: 4.34 x10E6/uL (ref 4.14–5.80)
RDW: 13.3 % (ref 12.3–15.4)
WBC: 7.6 10*3/uL (ref 3.4–10.8)

## 2017-05-22 LAB — TSH: TSH: 0.668 u[IU]/mL (ref 0.450–4.500)

## 2017-05-22 LAB — LIPID PANEL W/O CHOL/HDL RATIO
CHOLESTEROL TOTAL: 204 mg/dL — AB (ref 100–199)
HDL: 88 mg/dL (ref >39)
LDL Calculated: 99 mg/dL (ref 0–99)
Triglycerides: 85 mg/dL (ref 0–149)
VLDL Cholesterol Cal: 17 mg/dL (ref 5–40)

## 2017-05-25 ENCOUNTER — Encounter: Payer: Self-pay | Admitting: Family Medicine

## 2017-10-09 ENCOUNTER — Telehealth: Payer: Self-pay | Admitting: Family Medicine

## 2017-10-09 ENCOUNTER — Ambulatory Visit: Payer: Self-pay

## 2017-10-09 NOTE — Telephone Encounter (Unsigned)
Copied from [place] [id number]. Topic: Quick Communication - See Telephone Encounter >> Oct 09, 2017  2:46 PM Hewitt Shorts wrote: CRM for notification. See Telephone encounter for: pt has 5 people I his office that has been diagnosed with the flu and one is his secreatary which he has close contact with and he came home today feeling back and has a fever at 100.5 and he would like to know if tamiflu could be called in for him -best number [phone]  [facility] [address]   21/30/86.

## 2017-10-09 NOTE — Telephone Encounter (Signed)
See triage note.

## 2017-10-09 NOTE — Telephone Encounter (Signed)
Pt calling requesting Tamiflu for flu exposure. Pt having sx that started today.  Please call in to [facility] [address] Answer Assessment - Initial Assessment Questions 1. TYPE of EXPOSURE: "How were you exposed?" (e.g., close contact, not a close contact)     In office 2. DATE of EXPOSURE: "When did the exposure occur?" (e.g., hour, days, weeks)     This week 3. PREGNANCY: "Is there any chance you are pregnant?" "When was your last menstrual period?"     n/a 4. HIGH RISK for COMPLICATIONS: "Do you have any heart or lung problems? Do you have a weakened immune system?" (e.g., CHF, COPD, asthma, HIV positive, chemotherapy, renal failure, diabetes mellitus, sickle cell anemia) no 5. SYMPTOMS: "Do you have any symptoms?" (e.g., cough, fever, sore throat, difficulty breathing).     Fever, cold chills, coughing clear phlegm, nasal congestion  Protocols used: INFLUENZA EXPOSURE-A-AH

## 2017-10-12 NOTE — Telephone Encounter (Signed)
Spoke with patient. He was able to get medication Friday. Pt is feeling some better. Still have issue with keeping fever down. Pt was advised to contact our office if he did not start to feel better in a few days. Pt verbalized understanding and denied questions.

## 2017-10-12 NOTE — Telephone Encounter (Signed)
It is too late for this correct

## 2017-10-12 NOTE — Telephone Encounter (Signed)
It is technically outside of the window at this point, but please call and see if he would still like some medicine called in and I'm happy to do it

## 2017-10-16 ENCOUNTER — Ambulatory Visit: Payer: Self-pay | Admitting: *Deleted

## 2017-10-16 NOTE — Telephone Encounter (Addendum)
Patient called in with c/o "diarrhea, fever." He says "I was on Tamiflu and finished it up. Yesterday it started and I vomited x 1 last night. Today I've had about 5 liquid, watery movements. Today about 12, I had a temperature of 101. I am not eating solids, but I am able to keep down liquids and chicken noodle soup. I don't feel dehydrated." I asked if he was having abdominal pain, he said "no."  According to protocol, see PCP within 24 hours, no availability with provider or practice. I advised patient to go to UC to be evaluated for the fever, he verbalized understanding. He says "I will go."

## 2017-10-16 NOTE — Telephone Encounter (Signed)
Reason for Disposition . [1] Fever returns after gone for over 24 hours AND [2] symptoms worse or not improved  Answer Assessment - Initial Assessment Questions 1. DIARRHEA SEVERITY: "How bad is the diarrhea?" "How many extra stools have you had in the past 24 hours than normal?"    - MILD: Few loose or mushy BMs; increase of 1-3 stools over normal daily number of stools; mild increase in ostomy output.   - MODERATE: Increase of 4-6 stools daily over normal; moderate increase in ostomy output.   - SEVERE (or Worst Possible): Increase of 7 or more stools daily over normal; moderate increase in ostomy output; incontinence.     5 liquid 2. ONSET: "When did the diarrhea begin?"      Yesterday 3. BM CONSISTENCY: "How loose or watery is the diarrhea?"      Watery 4. VOMITING: "Are you also vomiting?" If so, ask: "How many times in the past 24 hours?"      Yes, once last evening 5. ABDOMINAL PAIN: "Are you having any abdominal pain?" If yes: "What does it feel like?" (e.g., crampy, dull, intermittent, constant)      No 6. ABDOMINAL PAIN SEVERITY: If present, ask: "How bad is the pain?"  (e.g., Scale 1-10; mild, moderate, or severe)    - MILD (1-3): doesn't interfere with normal activities, abdomen soft and not tender to touch     - MODERATE (4-7): interferes with normal activities or awakens from sleep, tender to touch     - SEVERE (8-10): excruciating pain, doubled over, unable to do any normal activities       N/A 7. ORAL INTAKE: If vomiting, "Have you been able to drink liquids?" "How much fluids have you had in the past 24 hours?"     Liquids 8. HYDRATION: "Any signs of dehydration?" (e.g., dry mouth [not just dry lips], too weak to stand, dizziness, new weight loss) "When did you last urinate?"     No; last urinated last at 3 pm 9. EXPOSURE: "Have you traveled to a foreign country recently?" "Have you been exposed to anyone with diarrhea?" "Could you have eaten any food that was spoiled?"     No 10. OTHER SYMPTOMS: "Do you have any other symptoms?" (e.g., fever, blood in stool)       Fever 11. PREGNANCY: "Is there any chance you are pregnant?" "When was your last menstrual period?"       N/A  Answer Assessment - Initial Assessment Questions 1. DIAGNOSIS CONFIRMATION: "When was the influenza diagnosed?" "By whom?" "Did you get a test for it?"     Not tested 2. MEDICINES: "Were you prescribed any medications for the influenza?"  (e.g., zanamivir [Relenza], oseltamavir [Tamiflu]).      Tamiflu taken 3. ONSET of SYMPTOMS: "When did your symptoms start?"     Started off and on all week 4. SYMPTOMS: "What symptoms are you most concerned about?" (e.g., runny nose, stuffy nose, sore throat, cough, breathing difficulty, fever)     Fever and diarrhea 5. COUGH: "How bad is the cough?"     Yes-some 6. FEVER: "Do you have a fever?" If so, ask: "What is your temperature, how was it measured, and when did it start?"     Yes-101 earlier today 7. RESPIRATORY DISTRESS: "Are you having any trouble breathing?" If yes, ask: "Describe your breathing."      No 8. FLU VACCINE: "Did you receive a flu shot this year?" (e.g., seasonal influenza, H1N1)  No 9. PREGNANCY: "Is there any chance you are pregnant?" "When was your last menstrual period?"     N/A 10. HIGH RISK for COMPLICATIONS: "Do you have any heart or lung problems? Do you have a weakened immune system?" (e.g., CHF, COPD, asthma, HIV positive, chemotherapy, renal failure, diabetes mellitus, sickle cell anemia)      No  Protocols used: INFLUENZA FOLLOW-UP CALL-A-AH, DIARRHEA-A-AH

## 2017-10-16 NOTE — Telephone Encounter (Signed)
Attempted to contact pt regarding symptoms left message on voice mail [phone]; unable to complete nurse triage at this time.

## 2017-10-16 NOTE — Telephone Encounter (Signed)
Attempted to return his call regarding diarrhea and vomiting.   Left message for him to call the office back.

## 2017-10-19 NOTE — Telephone Encounter (Signed)
Agree with below. Addendum:    I believe that she has limits with moving and including, toileting, bathing, feeding, dressing and grooming. I believe the power wheelchair is needed for pt to be able to perform ADL's in her home

## 2017-11-20 ENCOUNTER — Encounter: Payer: Self-pay | Admitting: Family Medicine

## 2017-11-20 ENCOUNTER — Ambulatory Visit (INDEPENDENT_AMBULATORY_CARE_PROVIDER_SITE_OTHER): Payer: BLUE CROSS/BLUE SHIELD | Admitting: Family Medicine

## 2017-11-20 VITALS — BP 120/70 | HR 82 | Wt 190.0 lb

## 2017-11-20 DIAGNOSIS — G4700 Insomnia, unspecified: Secondary | ICD-10-CM

## 2017-11-20 DIAGNOSIS — I10 Essential (primary) hypertension: Secondary | ICD-10-CM

## 2017-11-20 DIAGNOSIS — R079 Chest pain, unspecified: Secondary | ICD-10-CM | POA: Diagnosis not present

## 2017-11-20 DIAGNOSIS — L720 Epidermal cyst: Secondary | ICD-10-CM

## 2017-11-20 MED ORDER — GABAPENTIN 300 MG PO CAPS: 300.0000 mg | ORAL_CAPSULE | Freq: Three times a day (TID) | ORAL | 1 refills | Status: DC

## 2017-11-20 MED ORDER — LISINOPRIL 5 MG PO TABS: 5.0000 mg | ORAL_TABLET | Freq: Every day | ORAL | 1 refills | Status: DC

## 2017-11-20 MED ORDER — MONTELUKAST SODIUM 10 MG PO TABS: 10.0000 mg | ORAL_TABLET | Freq: Every day | ORAL | 3 refills | Status: DC

## 2017-11-20 MED ORDER — TRAZODONE HCL 50 MG PO TABS: 25.0000 mg | ORAL_TABLET | Freq: Every evening | ORAL | 1 refills | Status: DC | PRN

## 2017-11-20 NOTE — Progress Notes (Signed)
BP 120/70 (BP Location: Left Arm, Cuff Size: Normal)   Pulse 82   Wt 190 lb (86.2 kg)   SpO2 98%   BMI 29.15 kg/m    Subjective:    Patient ID: Edward Mckenzie, male    DOB: Feb 22, 1971, 47 y.o.   MRN: [id number]  HPI: Edward Mckenzie is a 47 y.o. male  Chief Complaint  Patient presents with  . Hypertension   HYPERTENSION Hypertension status: controlled  Satisfied with current treatment? yes Duration of hypertension: chronic BP monitoring frequency:  not checking BP medication side effects:  no Medication compliance: excellent compliance Previous BP meds: lisinopril Aspirin: no Recurrent headaches: no Visual changes: no Palpitations: no Dyspnea: no Chest pain: yes- R sided last week, hadn't done anything to his shoulder Lower extremity edema: no Dizzy/lightheaded: no  INSOMNIA Duration: chronic Satisfied with sleep quality: yes Difficulty falling asleep: no Difficulty staying asleep: no Waking a few hours after sleep onset: no Early morning awakenings: no Daytime hypersomnolence: no Wakes feeling refreshed: no Good sleep hygiene: yes Apnea: no Snoring: no Depressed/anxious mood: no Recent stress: yes Restless legs/nocturnal leg cramps: no Chronic pain/arthritis: no History of sleep study: no Treatments attempted: trazodone    Relevant past medical, surgical, family and social history reviewed and updated as indicated. Interim medical history since our last visit reviewed. Allergies and medications reviewed and updated.  Review of Systems  Constitutional: Negative.   Respiratory: Negative.   Cardiovascular: Positive for chest pain. Negative for palpitations and leg swelling.  Psychiatric/Behavioral: Negative.     Per HPI unless specifically indicated above     Objective:    BP 120/70 (BP Location: Left Arm, Cuff Size: Normal)   Pulse 82   Wt 190 lb (86.2 kg)   SpO2 98%   BMI 29.15 kg/m   Wt Readings from Last 3 Encounters:    11/20/17 190 lb (86.2 kg)  05/21/17 183 lb 7 oz (83.2 kg)  01/27/17 193 lb 6 oz (87.7 kg)    Physical Exam  Constitutional: He is oriented to person, place, and time. He appears well-developed and well-nourished. No distress.  HENT:  Head: Normocephalic and atraumatic.  Right Ear: Hearing normal.  Left Ear: Hearing normal.  Nose: Nose normal.  Eyes: Conjunctivae and lids are normal. Right eye exhibits no discharge. Left eye exhibits no discharge. No scleral icterus.  Cardiovascular: Normal rate, regular rhythm, normal heart sounds and intact distal pulses. Exam reveals no gallop and no friction rub.  No murmur heard. Pulmonary/Chest: Effort normal. No stridor. No respiratory distress. He has wheezes. He has no rales. He exhibits no tenderness.  Musculoskeletal: Normal range of motion.  Neurological: He is alert and oriented to person, place, and time.  Skin: Skin is warm, dry and intact. Capillary refill takes less than 2 seconds. No rash noted. He is not diaphoretic. No erythema. No pallor.  Epidermal cyst under chin on L side  Psychiatric: He has a normal mood and affect. His speech is normal and behavior is normal. Judgment and thought content normal. Cognition and memory are normal.  Nursing note and vitals reviewed.   Results for orders placed or performed in visit on 05/21/17  Microscopic Examination  Result Value Ref Range   WBC, UA 0-5 0 - 5 /hpf   RBC, UA None seen 0 - 2 /hpf   Epithelial Cells (non renal) 0-10 0 - 10 /hpf   Bacteria, UA None seen None seen/Few  CBC with Differential/Platelet  Result Value  Ref Range   WBC 7.6 3.4 - 10.8 x10E3/uL   RBC 4.34 4.14 - 5.80 x10E6/uL   Hemoglobin 15.1 13.0 - 17.7 g/dL   Hematocrit 45.0 37.5 - 51.0 %   MCV 104 (H) 79 - 97 fL   MCH 34.8 (H) 26.6 - 33.0 pg   MCHC 33.6 31.5 - 35.7 g/dL   RDW 13.3 12.3 - 15.4 %   Platelets 243 150 - 379 x10E3/uL   Neutrophils 52 Not Estab. %   Lymphs 39 Not Estab. %   Monocytes 8 Not Estab.  %   Eos 1 Not Estab. %   Basos 0 Not Estab. %   Neutrophils Absolute 4.0 1.4 - 7.0 x10E3/uL   Lymphocytes Absolute 3.0 0.7 - 3.1 x10E3/uL   Monocytes Absolute 0.6 0.1 - 0.9 x10E3/uL   EOS (ABSOLUTE) 0.1 0.0 - 0.4 x10E3/uL   Basophils Absolute 0.0 0.0 - 0.2 x10E3/uL   Immature Granulocytes 0 Not Estab. %   Immature Grans (Abs) 0.0 0.0 - 0.1 x10E3/uL  Comprehensive metabolic panel  Result Value Ref Range   Glucose 85 65 - 99 mg/dL   BUN 6 6 - 24 mg/dL   Creatinine, Ser 0.64 (L) 0.76 - 1.27 mg/dL   GFR calc non Af Amer 118 >59 mL/min/1.73   GFR calc Af Amer 137 >59 mL/min/1.73   BUN/Creatinine Ratio 9 9 - 20   Sodium 139 134 - 144 mmol/L   Potassium 4.8 3.5 - 5.2 mmol/L   Chloride 99 96 - 106 mmol/L   CO2 23 20 - 29 mmol/L   Calcium 9.7 8.7 - 10.2 mg/dL   Total Protein 7.1 6.0 - 8.5 g/dL   Albumin 4.5 3.5 - 5.5 g/dL   Globulin, Total 2.6 1.5 - 4.5 g/dL   Albumin/Globulin Ratio 1.7 1.2 - 2.2   Bilirubin Total 0.3 0.0 - 1.2 mg/dL   Alkaline Phosphatase 52 39 - 117 IU/L   AST 28 0 - 40 IU/L   ALT 21 0 - 44 IU/L  Lipid Panel w/o Chol/HDL Ratio  Result Value Ref Range   Cholesterol, Total 204 (H) 100 - 199 mg/dL   Triglycerides 85 0 - 149 mg/dL   HDL 88 >39 mg/dL   VLDL Cholesterol Cal 17 5 - 40 mg/dL   LDL Calculated 99 0 - 99 mg/dL  Microalbumin, Urine Waived  Result Value Ref Range   Microalb, Ur Waived 10 0 - 19 mg/L   Creatinine, Urine Waived 10 10 - 300 mg/dL   Microalb/Creat Ratio <30 <30 mg/g  TSH  Result Value Ref Range   TSH 0.668 0.450 - 4.500 uIU/mL  UA/M w/rflx Culture, Routine  Result Value Ref Range   Specific Gravity, UA <1.005 (L) 1.005 - 1.030   pH, UA 7.0 5.0 - 7.5   Color, UA Yellow Yellow   Appearance Ur Clear Clear   Leukocytes, UA Negative Negative   Protein, UA Negative Negative/Trace   Glucose, UA Negative Negative   Ketones, UA Negative Negative   RBC, UA Negative Negative   Bilirubin, UA Negative Negative   Urobilinogen, Ur 0.2 0.2 - 1.0  mg/dL   Nitrite, UA Negative Negative   Microscopic Examination See below:       Assessment & Plan:   Problem List Items Addressed This Visit      Cardiovascular and Mediastinum   HTN (hypertension) - Primary    Under good control. Continue current regimen. Continue to monitor. Call with any concerns.  Relevant Medications   lisinopril (PRINIVIL,ZESTRIL) 5 MG tablet   Other Relevant Orders   Basic metabolic panel     Other   Insomnia    Under good control. Continue current regimen. Continue to monitor. Call with any concerns.        Other Visit Diagnoses    Right-sided chest pain       Likely muscular. EKG normal. Heat, stretches. Call with any concerns.    Relevant Orders   EKG 12-Lead (Completed)   Epidermal cyst       Will return for removal ASAP       Follow up plan: Return ASAP for cyst removal, for 6 months for Physical.

## 2017-11-20 NOTE — Patient Instructions (Signed)
Shoulder Exercises Ask your health care provider which exercises are safe for you. Do exercises exactly as told by your health care provider and adjust them as directed. It is normal to feel mild stretching, pulling, tightness, or discomfort as you do these exercises, but you should stop right away if you feel sudden pain or your pain gets worse.Do not begin these exercises until told by your health care provider. RANGE OF MOTION EXERCISES These exercises warm up your muscles and joints and improve the movement and flexibility of your shoulder. These exercises also help to relieve pain, numbness, and tingling. These exercises involve stretching your injured shoulder directly. Exercise A: Pendulum  1. Stand near a wall or a surface that you can hold onto for balance. 2. Bend at the waist and let your left / right arm hang straight down. Use your other arm to support you. Keep your back straight and do not lock your knees. 3. Relax your left / right arm and shoulder muscles, and move your hips and your trunk so your left / right arm swings freely. Your arm should swing because of the motion of your body, not because you are using your arm or shoulder muscles. 4. Keep moving your body so your arm swings in the following directions, as told by your health care provider: ? Side to side. ? Forward and backward. ? In clockwise and counterclockwise circles. 5. Continue each motion for __________ seconds, or for as long as told by your health care provider. 6. Slowly return to the starting position. Repeat __________ times. Complete this exercise __________ times a day. Exercise B:Flexion, Standing  1. Stand and hold a broomstick, a cane, or a similar object. Place your hands a little more than shoulder-width apart on the object. Your left / right hand should be palm-up, and your other hand should be palm-down. 2. Keep your elbow straight and keep your shoulder muscles relaxed. Push the stick down with  your healthy arm to raise your left / right arm in front of your body, and then over your head until you feel a stretch in your shoulder. ? Avoid shrugging your shoulder while you raise your arm. Keep your shoulder blade tucked down toward the middle of your back. 3. Hold for __________ seconds. 4. Slowly return to the starting position. Repeat __________ times. Complete this exercise __________ times a day. Exercise C: Abduction, Standing 1. Stand and hold a broomstick, a cane, or a similar object. Place your hands a little more than shoulder-width apart on the object. Your left / right hand should be palm-up, and your other hand should be palm-down. 2. While keeping your elbow straight and your shoulder muscles relaxed, push the stick across your body toward your left / right side. Raise your left / right arm to the side of your body and then over your head until you feel a stretch in your shoulder. ? Do not raise your arm above shoulder height, unless your health care provider tells you to do that. ? Avoid shrugging your shoulder while you raise your arm. Keep your shoulder blade tucked down toward the middle of your back. 3. Hold for __________ seconds. 4. Slowly return to the starting position. Repeat __________ times. Complete this exercise __________ times a day. Exercise D:Internal Rotation  1. Place your left / right hand behind your back, palm-up. 2. Use your other hand to dangle an exercise band, a towel, or a similar object over your shoulder. Grasp the band with   your left / right hand so you are holding onto both ends. 3. Gently pull up on the band until you feel a stretch in the front of your left / right shoulder. ? Avoid shrugging your shoulder while you raise your arm. Keep your shoulder blade tucked down toward the middle of your back. 4. Hold for __________ seconds. 5. Release the stretch by letting go of the band and lowering your hands. Repeat __________ times. Complete  this exercise __________ times a day. STRETCHING EXERCISES These exercises warm up your muscles and joints and improve the movement and flexibility of your shoulder. These exercises also help to relieve pain, numbness, and tingling. These exercises are done using your healthy shoulder to help stretch the muscles of your injured shoulder. Exercise E: Corner Stretch (External Rotation and Abduction)  1. Stand in a doorway with one of your feet slightly in front of the other. This is called a staggered stance. If you cannot reach your forearms to the door frame, stand facing a corner of a room. 2. Choose one of the following positions as told by your health care provider: ? Place your hands and forearms on the door frame above your head. ? Place your hands and forearms on the door frame at the height of your head. ? Place your hands on the door frame at the height of your elbows. 3. Slowly move your weight onto your front foot until you feel a stretch across your chest and in the front of your shoulders. Keep your head and chest upright and keep your abdominal muscles tight. 4. Hold for __________ seconds. 5. To release the stretch, shift your weight to your back foot. Repeat __________ times. Complete this stretch __________ times a day. Exercise F:Extension, Standing 1. Stand and hold a broomstick, a cane, or a similar object behind your back. ? Your hands should be a little wider than shoulder-width apart. ? Your palms should face away from your back. 2. Keeping your elbows straight and keeping your shoulder muscles relaxed, move the stick away from your body until you feel a stretch in your shoulder. ? Avoid shrugging your shoulders while you move the stick. Keep your shoulder blade tucked down toward the middle of your back. 3. Hold for __________ seconds. 4. Slowly return to the starting position. Repeat __________ times. Complete this exercise __________ times a day. STRENGTHENING  EXERCISES These exercises build strength and endurance in your shoulder. Endurance is the ability to use your muscles for a long time, even after they get tired. Exercise G:External Rotation  1. Sit in a stable chair without armrests. 2. Secure an exercise band at elbow height on your left / right side. 3. Place a soft object, such as a folded towel or a small pillow, between your left / right upper arm and your body to move your elbow a few inches away (about 10 cm) from your side. 4. Hold the end of the band so it is tight and there is no slack. 5. Keeping your elbow pressed against the soft object, move your left / right forearm out, away from your abdomen. Keep your body steady so only your forearm moves. 6. Hold for __________ seconds. 7. Slowly return to the starting position. Repeat __________ times. Complete this exercise __________ times a day. Exercise H:Shoulder Abduction  1. Sit in a stable chair without armrests, or stand. 2. Hold a __________ weight in your left / right hand, or hold an exercise band with both hands.   3. Start with your arms straight down and your left / right palm facing in, toward your body. 4. Slowly lift your left / right hand out to your side. Do not lift your hand above shoulder height unless your health care provider tells you that this is safe. ? Keep your arms straight. ? Avoid shrugging your shoulder while you do this movement. Keep your shoulder blade tucked down toward the middle of your back. 5. Hold for __________ seconds. 6. Slowly lower your arm, and return to the starting position. Repeat __________ times. Complete this exercise __________ times a day. Exercise I:Shoulder Extension 1. Sit in a stable chair without armrests, or stand. 2. Secure an exercise band to a stable object in front of you where it is at shoulder height. 3. Hold one end of the exercise band in each hand. Your palms should face each other. 4. Straighten your elbows and  lift your hands up to shoulder height. 5. Step back, away from the secured end of the exercise band, until the band is tight and there is no slack. 6. Squeeze your shoulder blades together as you pull your hands down to the sides of your thighs. Stop when your hands are straight down by your sides. Do not let your hands go behind your body. 7. Hold for __________ seconds. 8. Slowly return to the starting position. Repeat __________ times. Complete this exercise __________ times a day. Exercise J:Standing Shoulder Row 1. Sit in a stable chair without armrests, or stand. 2. Secure an exercise band to a stable object in front of you so it is at waist height. 3. Hold one end of the exercise band in each hand. Your palms should be in a thumbs-up position. 4. Bend each of your elbows to an "L" shape (about 90 degrees) and keep your upper arms at your sides. 5. Step back until the band is tight and there is no slack. 6. Slowly pull your elbows back behind you. 7. Hold for __________ seconds. 8. Slowly return to the starting position. Repeat __________ times. Complete this exercise __________ times a day. Exercise K:Shoulder Press-Ups  1. Sit in a stable chair that has armrests. Sit upright, with your feet flat on the floor. 2. Put your hands on the armrests so your elbows are bent and your fingers are pointing forward. Your hands should be about even with the sides of your body. 3. Push down on the armrests and use your arms to lift yourself off of the chair. Straighten your elbows and lift yourself up as much as you comfortably can. ? Move your shoulder blades down, and avoid letting your shoulders move up toward your ears. ? Keep your feet on the ground. As you get stronger, your feet should support less of your body weight as you lift yourself up. 4. Hold for __________ seconds. 5. Slowly lower yourself back into the chair. Repeat __________ times. Complete this exercise __________ times a  day. Exercise L: Wall Push-Ups  1. Stand so you are facing a stable wall. Your feet should be about one arm-length away from the wall. 2. Lean forward and place your palms on the wall at shoulder height. 3. Keep your feet flat on the floor as you bend your elbows and lean forward toward the wall. 4. Hold for __________ seconds. 5. Straighten your elbows to push yourself back to the starting position. Repeat __________ times. Complete this exercise __________ times a day. This information is not intended to replace advice   given to you by your health care provider. Make sure you discuss any questions you have with your health care provider. Document Released: 06/11/2005 Document Revised: 04/21/2016 Document Reviewed: 04/08/2015 Elsevier Interactive Patient Education  2018 [employer]  

## 2017-11-20 NOTE — Assessment & Plan Note (Signed)
Under good control. Continue current regimen. Continue to monitor. Call with any concerns. 

## 2017-11-21 LAB — BASIC METABOLIC PANEL
BUN / CREAT RATIO: 7 — AB (ref 9–20)
BUN: 4 mg/dL — AB (ref 6–24)
CO2: 21 mmol/L (ref 20–29)
CREATININE: 0.55 mg/dL — AB (ref 0.76–1.27)
Calcium: 9.1 mg/dL (ref 8.7–10.2)
Chloride: 100 mmol/L (ref 96–106)
GFR calc Af Amer: 144 mL/min/{1.73_m2} (ref >59)
GFR calc non Af Amer: 125 mL/min/{1.73_m2} (ref >59)
GLUCOSE: 87 mg/dL (ref 65–99)
Potassium: 4.6 mmol/L (ref 3.5–5.2)
Sodium: 137 mmol/L (ref 134–144)

## 2017-11-23 ENCOUNTER — Encounter: Payer: Self-pay | Admitting: Family Medicine

## 2017-11-23 ENCOUNTER — Ambulatory Visit: Payer: BLUE CROSS/BLUE SHIELD | Admitting: Family Medicine

## 2017-11-23 DIAGNOSIS — L720 Epidermal cyst: Secondary | ICD-10-CM | POA: Diagnosis not present

## 2017-11-23 NOTE — Progress Notes (Signed)
BP (!) 171/99   Pulse 97   Ht 5' 7.7" (1.72 m)   Wt 190 lb (86.2 kg)   SpO2 98%   BMI 29.15 kg/m    Subjective:    Patient ID: Edward Mckenzie, male    DOB: September 07, 1970, 47 y.o.   MRN: [id number]  HPI: Edward Mckenzie is a 47 y.o. male  Chief Complaint  Patient presents with  . Proceedure    Cyst removal   LUMP Duration: months Location: under L side of Chin Onset: gradual Painful: only when he cuts it when shaving Discomfort: only when he cuts it when shaving Status:  not changing Trauma: no Redness: yes Bruising: no Recent infection: no Swollen lymph nodes: no Requesting removal: yes History of cancer: no  Relevant past medical, surgical, family and social history reviewed and updated as indicated. Interim medical history since our last visit reviewed. Allergies and medications reviewed and updated.  Review of Systems  Constitutional: Negative.   Respiratory: Negative.   Cardiovascular: Negative.   Psychiatric/Behavioral: Negative.     Per HPI unless specifically indicated above     Objective:    BP (!) 171/99   Pulse 97   Ht 5' 7.7" (1.72 m)   Wt 190 lb (86.2 kg)   SpO2 98%   BMI 29.15 kg/m   Wt Readings from Last 3 Encounters:  11/23/17 190 lb (86.2 kg)  11/20/17 190 lb (86.2 kg)  05/21/17 183 lb 7 oz (83.2 kg)    Physical Exam  Constitutional: He is oriented to person, place, and time. He appears well-developed and well-nourished. No distress.  HENT:  Head: Normocephalic and atraumatic.  Right Ear: Hearing normal.  Left Ear: Hearing normal.  Nose: Nose normal.  Eyes: Conjunctivae and lids are normal. Right eye exhibits no discharge. Left eye exhibits no discharge. No scleral icterus.  Pulmonary/Chest: Effort normal. No respiratory distress.  Musculoskeletal: Normal range of motion.  Neurological: He is alert and oriented to person, place, and time.  Skin: Skin is warm, dry and intact. Capillary refill takes less than 2  seconds. No rash noted. He is not diaphoretic. No erythema. No pallor.  0.5cm epidermal cyst under L side of chin on neck   Psychiatric: He has a normal mood and affect. His speech is normal and behavior is normal. Judgment and thought content normal. Cognition and memory are normal.    Results for orders placed or performed in visit on 31/49/70  Basic metabolic panel  Result Value Ref Range   Glucose 87 65 - 99 mg/dL   BUN 4 (L) 6 - 24 mg/dL   Creatinine, Ser 0.55 (L) 0.76 - 1.27 mg/dL   GFR calc non Af Amer 125 >59 mL/min/1.73   GFR calc Af Amer 144 >59 mL/min/1.73   BUN/Creatinine Ratio 7 (L) 9 - 20   Sodium 137 134 - 144 mmol/L   Potassium 4.6 3.5 - 5.2 mmol/L   Chloride 100 96 - 106 mmol/L   CO2 21 20 - 29 mmol/L   Calcium 9.1 8.7 - 10.2 mg/dL      Assessment & Plan:   Problem List Items Addressed This Visit    None    Visit Diagnoses    Epidermal cyst       Removed today with good results as below      Skin Procedure  Procedure: Informed consent given.  Area infiltrated with lidocaine with epinephrine.  Lesioncompletely excised and sent for pathology.  Diagnosis:   ICD-10-CM   1. Epidermal cyst L72.0    Removed today with good results as below    Lesion Location/Size: 0.5cm epidermal cyst under L side of chin on neck Physician: MJ Consent:  Risks, benefits, and alternative treatments discussed and all questions were answered.  Patient elected to proceed and verbal consent obtained.  Description: Area prepped and draped using semi-sterile technique. Area locally anesthetized using 4 cc's of lidocaine 1% with epi. Using a 15 blade scalpel, a elliptical incision was made encompassing the lesion. Cyst cavity entered and mild amount of cheesy material expressed.  Cyst wall was removed in tact using mosquito hemostat. Adequate hemostastis achieved using electrocautery. Repair done using 5 3.0 vicryl simple interrupted stitches. Good cosmetic appearance. Wound dressed  with steri-strips.    Post Procedure Instructions: Wound care instructions discussed and patient was instructed to keep area clean and dry.  Signs and symptoms of infection discussed, patient agrees to contact the office ASAP should they occur.  Dressing change recommended every other day. Suture removal in 7-10 days   Follow up plan: Return 7-10 days, for suture removal.

## 2017-11-23 NOTE — Patient Instructions (Signed)
Epidermal Cyst Removal, Care After Refer to this sheet in the next few weeks. These instructions provide you with information about caring for yourself after your procedure. Your health care provider may also give you more specific instructions. Your treatment has been planned according to current medical practices, but problems sometimes occur. Call your health care provider if you have any problems or questions after your procedure. What can I expect after the procedure? After the procedure, it is common to have:  Soreness in the area where your cyst was removed.  Tightness or itching from your skin sutures.  Follow these instructions at home:  Take medicines only as directed by your health care provider.  If you were prescribed an antibiotic medicine, finish all of it even if you start to feel better.  Use antibiotic ointment as directed by your health care provider. Follow the instructions carefully.  There are many different ways to close and cover an incision, including stitches (sutures), skin glue, and adhesive strips. Follow your health care provider's instructions about: ? Incision care. ? Bandage (dressing) changes and removal. ? Incision closure removal.  Keep the bandage (dressing) dry until your health care provider says that it can be removed. Take sponge baths only. Ask your health care provider when you can start showering or taking a bath.  After your dressing is off, check your incision every day for signs of infection. Watch for: ? Redness, swelling, or pain. ? Fluid, blood, or pus.  You can return to your normal activities. Do not do anything that stretches or puts pressure on your incision.  You can return to your normal diet.  Keep all follow-up visits as directed by your health care provider. This is important. Contact a health care provider if:  You have a fever.  Your incision bleeds.  You have redness, swelling, or pain in the incision area.  You  have fluid, blood, or pus coming from your incision.  Your cyst comes back after surgery. This information is not intended to replace advice given to you by your health care provider. Make sure you discuss any questions you have with your health care provider. Document Released: 08/18/2014 Document Revised: 01/03/2016 Document Reviewed: 04/12/2014 Elsevier Interactive Patient Education  2018 [employer]  

## 2017-11-30 ENCOUNTER — Ambulatory Visit (INDEPENDENT_AMBULATORY_CARE_PROVIDER_SITE_OTHER): Payer: BLUE CROSS/BLUE SHIELD | Admitting: Family Medicine

## 2017-11-30 DIAGNOSIS — Z4802 Encounter for removal of sutures: Secondary | ICD-10-CM

## 2017-11-30 NOTE — Progress Notes (Signed)
Wound healing well. 5 stitches removed. Keep covered with bandaide for next couple of days. Call with any concerns.

## 2018-05-24 NOTE — Progress Notes (Signed)
BP 117/74   Pulse 88   Ht 5' 9.69" (1.77 m)   Wt 179 lb (81.2 kg)   SpO2 98%   BMI 25.92 kg/m    Subjective:    Patient ID: Edward Mckenzie, male    DOB: 09/17/1970, 47 y.o.   MRN: [id number]  HPI: SAIR Edward Mckenzie is a 47 y.o. male presenting on 05/25/2018 for comprehensive medical examination. Current medical complaints include:  HYPERTENSION Hypertension status: controlled  Satisfied with current treatment? yes Duration of hypertension: chronic BP monitoring frequency:  not checking BP medication side effects:  no Medication compliance: excellent compliance Previous BP meds: lisinopril Aspirin: no Recurrent headaches: no Visual changes: no Palpitations: no Dyspnea: no Chest pain: no Lower extremity edema: no Dizzy/lightheaded: no  INSOMNIA Duration: chronic Satisfied with sleep quality: no Difficulty falling asleep: no Difficulty staying asleep: yes Waking a few hours after sleep onset: yes Early morning awakenings: yes Daytime hypersomnolence: no Wakes feeling refreshed: no Good sleep hygiene: yes Apnea: no Snoring: no Depressed/anxious mood: no Recent stress: no Restless legs/nocturnal leg cramps: no Chronic pain/arthritis: no History of sleep study: no Treatments attempted: trazodone, melatonin, uinsom and benadryl   Interim Problems from his last visit: no  Depression Screen done today and results listed below:  Depression screen [facility] 2/9 05/25/2018 05/21/2017 01/24/2016  Decreased Interest 0 0 0  Down, Depressed, Hopeless 0 0 0  PHQ - 2 Score 0 0 0  Altered sleeping 2 - 3  Tired, decreased energy 0 - 2  Change in appetite 0 - 0  Feeling bad or failure about yourself  0 - 0  Trouble concentrating 0 - 0  Moving slowly or fidgety/restless 0 - 0  Suicidal thoughts 0 - 0  PHQ-9 Score 2 - 5  Difficult doing work/chores Somewhat difficult - -    Past Medical History:  Past Medical History:  Diagnosis Date  . Carpal tunnel syndrome  of right wrist   . Jones fracture    left foot  . Wears contact lenses     Surgical History:  Past Surgical History:  Procedure Laterality Date  . BACK SURGERY    . KNEE SURGERY    . NASAL SINUS SURGERY  02/22/98   UPPP, tonguepexy, septo.  Dr. Kathyrn Sheriff. [place]  . ORIF TOE FRACTURE Left 12/20/2015   Procedure: OPEN REDUCTION INTERNAL FIXATION (ORIF) 5TH METATARSAL (TOE) FRACTURE LEFT;  Surgeon: Albertine Patricia, DPM;  Location: [place];  Service: Podiatry;  Laterality: Left;  LMA WITH POPLITEAL  . TONSILLECTOMY AND ADENOIDECTOMY      Medications:  Current Outpatient Medications on File Prior to Visit  Medication Sig  . Ascorbic Acid (VITAMIN C) 100 MG tablet Take 100 mg by mouth daily.  . metroNIDAZOLE (METROGEL) 1 % gel Apply topically daily.  . montelukast (SINGULAIR) 10 MG tablet Take 1 tablet (10 mg total) by mouth at bedtime.  . Omega-3 Fatty Acids (FISH OIL) 1000 MG CAPS Take 1,000 mg by mouth daily.  Marland Kitchen thiamine (VITAMIN B-1) 100 MG tablet Take 100 mg by mouth daily.   No current facility-administered medications on file prior to visit.     Allergies:  No Known Allergies  Social History:  Social History   Socioeconomic History  . Marital status: Single    Spouse name: Not on file  . Number of children: Not on file  . Years of education: Not on file  . Highest education level: Not on file  Occupational History  . Not  on file  Social Needs  . Financial resource strain: Not on file  . Food insecurity:    Worry: Not on file    Inability: Not on file  . Transportation needs:    Medical: Not on file    Non-medical: Not on file  Tobacco Use  . Smoking status: Current Every Day Smoker    Packs/day: 1.00    Types: Cigarettes  . Smokeless tobacco: Former Network engineer and Sexual Activity  . Alcohol use: Yes    Alcohol/week: 0.0 standard drinks    Comment: pt states "recently quit".  . Drug use: No  . Sexual activity: Yes  Lifestyle  . Physical  activity:    Days per week: Not on file    Minutes per session: Not on file  . Stress: Not on file  Relationships  . Social connections:    Talks on phone: Not on file    Gets together: Not on file    Attends religious service: Not on file    Active member of club or organization: Not on file    Attends meetings of clubs or organizations: Not on file    Relationship status: Not on file  . Intimate partner violence:    Fear of current or ex partner: Not on file    Emotionally abused: Not on file    Physically abused: Not on file    Forced sexual activity: Not on file  Other Topics Concern  . Not on file  Social History Narrative  . Not on file   Social History   Tobacco Use  Smoking Status Current Every Day Smoker  . Packs/day: 1.00  . Types: Cigarettes  Smokeless Tobacco Former Systems developer   Social History   Substance and Sexual Activity  Alcohol Use Yes  . Alcohol/week: 0.0 standard drinks   Comment: pt states "recently quit".    Family History:  Family History  Problem Relation Age of Onset  . Heart disease Father   . Diabetes Paternal Grandfather   . Heart disease Paternal Grandfather     Past medical history, surgical history, medications, allergies, family history and social history reviewed with patient today and changes made to appropriate areas of the chart.   Review of Systems  Constitutional: Negative.   HENT: Negative.   Eyes: Negative.   Respiratory: Negative.   Cardiovascular: Negative.   Gastrointestinal: Negative.   Genitourinary: Negative.   Musculoskeletal: Negative.   Skin: Negative.   Neurological: Negative.   Endo/Heme/Allergies: Positive for environmental allergies. Negative for polydipsia. Bruises/bleeds easily.  Psychiatric/Behavioral: Negative.     All other ROS negative except what is listed above and in the HPI.      Objective:    BP 117/74   Pulse 88   Ht 5' 9.69" (1.77 m)   Wt 179 lb (81.2 kg)   SpO2 98%   BMI 25.92 kg/m     Wt Readings from Last 3 Encounters:  05/25/18 179 lb (81.2 kg)  11/23/17 190 lb (86.2 kg)  11/20/17 190 lb (86.2 kg)    Physical Exam  Constitutional: He is oriented to person, place, and time. He appears well-developed and well-nourished. No distress.  HENT:  Head: Normocephalic and atraumatic.  Right Ear: Hearing, tympanic membrane, external ear and ear canal normal.  Left Ear: Hearing, tympanic membrane, external ear and ear canal normal.  Nose: Nose normal.  Mouth/Throat: Uvula is midline, oropharynx is clear and moist and mucous membranes are normal. No oropharyngeal exudate.  Eyes: Pupils are equal, round, and reactive to light. Conjunctivae, EOM and lids are normal. Right eye exhibits no discharge. Left eye exhibits no discharge. No scleral icterus.  Neck: Normal range of motion. Neck supple. No JVD present. No tracheal deviation present. No thyromegaly present.  Cardiovascular: Normal rate, regular rhythm, normal heart sounds and intact distal pulses. Exam reveals no gallop and no friction rub.  No murmur heard. Pulmonary/Chest: Effort normal and breath sounds normal. No stridor. No respiratory distress. He has no wheezes. He has no rales. He exhibits no tenderness.    Abdominal: Soft. Bowel sounds are normal. He exhibits no distension and no mass. There is no tenderness. There is no rebound and no guarding. No hernia.  Genitourinary:  Genitourinary Comments: Genital exam deferred with shared decision making  Musculoskeletal: Normal range of motion. He exhibits no edema, tenderness or deformity.  Lymphadenopathy:    He has no cervical adenopathy.  Neurological: He is alert and oriented to person, place, and time. He displays normal reflexes. No cranial nerve deficit or sensory deficit. He exhibits normal muscle tone. Coordination normal.  Skin: Skin is warm, dry and intact. Capillary refill takes less than 2 seconds. No rash noted. He is not diaphoretic. No erythema. No pallor.   Psychiatric: He has a normal mood and affect. His speech is normal and behavior is normal. Judgment and thought content normal. Cognition and memory are normal.  Nursing note and vitals reviewed.   Results for orders placed or performed in visit on 05/27/50  Basic metabolic panel  Result Value Ref Range   Glucose 87 65 - 99 mg/dL   BUN 4 (L) 6 - 24 mg/dL   Creatinine, Ser 0.55 (L) 0.76 - 1.27 mg/dL   GFR calc non Af Amer 125 >59 mL/min/1.73   GFR calc Af Amer 144 >59 mL/min/1.73   BUN/Creatinine Ratio 7 (L) 9 - 20   Sodium 137 134 - 144 mmol/L   Potassium 4.6 3.5 - 5.2 mmol/L   Chloride 100 96 - 106 mmol/L   CO2 21 20 - 29 mmol/L   Calcium 9.1 8.7 - 10.2 mg/dL      Assessment & Plan:   Problem List Items Addressed This Visit      Cardiovascular and Mediastinum   HTN (hypertension)    Under good control on current regimen. Continue current regimen. Continue to monitor. Call with any concerns. Refills given.        Relevant Medications   lisinopril (PRINIVIL,ZESTRIL) 5 MG tablet   Other Relevant Orders   Microalbumin, Urine Waived     Respiratory   Allergic rhinitis    Seeming to be getting worse on his current regimen. Will get him into ENT for evaluation. Call with any concerns.       Relevant Orders   Ambulatory referral to ENT     Musculoskeletal and Integument   Rosacea    Stable on current regimen. Continue to monitor. Call with any concerns.         Other   Insomnia    Not under good control. Will increase trazodone to 100mg . Call with any concerns.        Other Visit Diagnoses    Routine general medical examination at a health care facility    -  Primary   Vaccines up to date. Screening labs checked today. Continue diet and exercise. Call with any concerns.    Relevant Orders   CBC with Differential/Platelet   Comprehensive metabolic panel  Lipid Panel w/o Chol/HDL Ratio   PSA   TSH   UA/M w/rflx Culture, Routine   Breast lump       Will get  [place] to look for cause. Call with any concerns. Await results.    Relevant Orders   US BREAST LTD UNI LEFT INC AXILLA       Discussed aspirin prophylaxis for myocardial infarction prevention and decision was it was not indicated  LABORATORY TESTING:  Health maintenance labs ordered today as discussed above.   The natural history of prostate cancer and ongoing controversy regarding screening and potential treatment outcomes of prostate cancer has been discussed with the patient. The meaning of a false positive PSA and a false negative PSA has been discussed. He indicates understanding of the limitations of this screening test and wishes to proceed with screening PSA testing.   IMMUNIZATIONS:   - Tdap: Tetanus vaccination status reviewed: last tetanus booster within 10 years. - Influenza: Refused - Pneumovax: Administered today   PATIENT COUNSELING:    Sexuality: Discussed sexually transmitted diseases, partner selection, use of condoms, avoidance of unintended pregnancy  and contraceptive alternatives.   Advised to avoid cigarette smoking.  I discussed with the patient that most people either abstain from alcohol or drink within safe limits (<=14/week and <=4 drinks/occasion for males, <=7/weeks and <= 3 drinks/occasion for females) and that the risk for alcohol disorders and other health effects rises proportionally with the number of drinks per week and how often a drinker exceeds daily limits.  Discussed cessation/primary prevention of drug use and availability of treatment for abuse.   Diet: Encouraged to adjust caloric intake to maintain  or achieve ideal body weight, to reduce intake of dietary saturated fat and total fat, to limit sodium intake by avoiding high sodium foods and not adding table salt, and to maintain adequate dietary potassium and calcium preferably from fresh fruits, vegetables, and low-fat dairy products.    stressed the importance of regular exercise  Injury  prevention: Discussed safety belts, safety helmets, smoke detector, smoking near bedding or upholstery.   Dental health: Discussed importance of regular tooth brushing, flossing, and dental visits.   Follow up plan: NEXT PREVENTATIVE PHYSICAL DUE IN 1 YEAR. Return in about 6 months (around 11/24/2018) for Follow up.

## 2018-05-25 ENCOUNTER — Encounter: Payer: Self-pay | Admitting: Family Medicine

## 2018-05-25 ENCOUNTER — Ambulatory Visit (INDEPENDENT_AMBULATORY_CARE_PROVIDER_SITE_OTHER): Payer: BLUE CROSS/BLUE SHIELD | Admitting: Family Medicine

## 2018-05-25 VITALS — BP 117/74 | HR 88 | Ht 69.69 in | Wt 179.0 lb

## 2018-05-25 DIAGNOSIS — G4700 Insomnia, unspecified: Secondary | ICD-10-CM

## 2018-05-25 DIAGNOSIS — Z0000 Encounter for general adult medical examination without abnormal findings: Secondary | ICD-10-CM | POA: Diagnosis not present

## 2018-05-25 DIAGNOSIS — J301 Allergic rhinitis due to pollen: Secondary | ICD-10-CM | POA: Diagnosis not present

## 2018-05-25 DIAGNOSIS — N63 Unspecified lump in unspecified breast: Secondary | ICD-10-CM

## 2018-05-25 DIAGNOSIS — Z23 Encounter for immunization: Secondary | ICD-10-CM | POA: Diagnosis not present

## 2018-05-25 DIAGNOSIS — I10 Essential (primary) hypertension: Secondary | ICD-10-CM | POA: Diagnosis not present

## 2018-05-25 DIAGNOSIS — L719 Rosacea, unspecified: Secondary | ICD-10-CM | POA: Diagnosis not present

## 2018-05-25 LAB — UA/M W/RFLX CULTURE, ROUTINE
Bilirubin, UA: NEGATIVE
Glucose, UA: NEGATIVE
Ketones, UA: NEGATIVE
LEUKOCYTES UA: NEGATIVE
NITRITE UA: NEGATIVE
PH UA: 7 (ref 5.0–7.5)
PROTEIN UA: NEGATIVE
RBC, UA: NEGATIVE
Specific Gravity, UA: 1.01 (ref 1.005–1.030)
Urobilinogen, Ur: 0.2 mg/dL (ref 0.2–1.0)

## 2018-05-25 LAB — MICROALBUMIN, URINE WAIVED
CREATININE, URINE WAIVED: 50 mg/dL (ref 10–300)
Microalb, Ur Waived: 10 mg/L (ref 0–19)
Microalb/Creat Ratio: <30 mg/g (ref <30)

## 2018-05-25 MED ORDER — GABAPENTIN 300 MG PO CAPS: 300.0000 mg | ORAL_CAPSULE | Freq: Three times a day (TID) | ORAL | 1 refills | Status: DC

## 2018-05-25 MED ORDER — FLUTICASONE PROPIONATE 50 MCG/ACT NA SUSP: NASAL | 12 refills | Status: DC

## 2018-05-25 MED ORDER — TRAZODONE HCL 100 MG PO TABS: 50.0000 mg | ORAL_TABLET | Freq: Every evening | ORAL | 1 refills | Status: DC | PRN

## 2018-05-25 MED ORDER — LISINOPRIL 5 MG PO TABS: 5.0000 mg | ORAL_TABLET | Freq: Every day | ORAL | 1 refills | Status: DC

## 2018-05-25 NOTE — Assessment & Plan Note (Signed)
Under good control on current regimen. Continue current regimen. Continue to monitor. Call with any concerns. Refills given.   

## 2018-05-25 NOTE — Assessment & Plan Note (Signed)
Stable on current regimen. Continue to monitor. Call with any concerns.  

## 2018-05-25 NOTE — Assessment & Plan Note (Signed)
Not under good control. Will increase trazodone to 100mg . Call with any concerns.

## 2018-05-25 NOTE — Assessment & Plan Note (Signed)
Seeming to be getting worse on his current regimen. Will get him into ENT for evaluation. Call with any concerns.

## 2018-05-25 NOTE — Patient Instructions (Addendum)
[facility]  Address: [address], [place], [place] [address]  Phone: [phone]  Pneumococcal Polysaccharide Vaccine: What You Need to Know 1. Why get vaccinated? Vaccination can protect older adults (and some children and younger adults) from pneumococcal disease. Pneumococcal disease is caused by bacteria that can spread from person to person through close contact. It can cause ear infections, and it can also lead to more serious infections of the:  Lungs (pneumonia),  Blood (bacteremia), and  Covering of the brain and spinal cord (meningitis). Meningitis can cause deafness and brain damage, and it can be fatal.  Anyone can get pneumococcal disease, but children under 33 years of age, people with certain medical conditions, adults over 55 years of age, and cigarette smokers are at the highest risk. About 18,000 older adults die each year from pneumococcal disease in the [place]. Treatment of pneumococcal infections with penicillin and other drugs used to be more effective. But some strains of the disease have become resistant to these drugs. This makes prevention of the disease, through vaccination, even more important. 2. Pneumococcal polysaccharide vaccine (PPSV23) Pneumococcal polysaccharide vaccine (PPSV23) protects against 23 types of pneumococcal bacteria. It will not prevent all pneumococcal disease. PPSV23 is recommended for:  All adults 72 years of age and older,  Anyone 2 through 46 years of age with certain long-term health problems,  Anyone 2 through 46 years of age with a weakened immune system,  Adults 62 through 46 years of age who smoke cigarettes or have asthma.  Most people need only one dose of PPSV. A second dose is recommended for certain high-risk groups. People 83 and older should get a dose even if they have gotten one or more doses of the vaccine before they turned 65. Your healthcare provider can give you  more information about these recommendations. Most healthy adults develop protection within 2 to 3 weeks of getting the shot. 3. Some people should not get this vaccine  Anyone who has had a life-threatening allergic reaction to PPSV should not get another dose.  Anyone who has a severe allergy to any component of PPSV should not receive it. Tell your provider if you have any severe allergies.  Anyone who is moderately or severely ill when the shot is scheduled may be asked to wait until they recover before getting the vaccine. Someone with a mild illness can usually be vaccinated.  Children less than 83 years of age should not receive this vaccine.  There is no evidence that PPSV is harmful to either a pregnant woman or to her fetus. However, as a precaution, women who need the vaccine should be vaccinated before becoming pregnant, if possible. 4. Risks of a vaccine reaction With any medicine, including vaccines, there is a chance of side effects. These are usually mild and go away on their own, but serious reactions are also possible. About half of people who get PPSV have mild side effects, such as redness or pain where the shot is given, which go away within about two days. Less than 1 out of 100 people develop a fever, muscle aches, or more severe local reactions. Problems that could happen after any vaccine:  People sometimes faint after a medical procedure, including vaccination. Sitting or lying down for about 15 minutes can help prevent fainting, and injuries caused by a fall. Tell your doctor if you feel dizzy, or have vision changes or ringing in the ears.  Some people get severe  pain in the shoulder and have difficulty moving the arm where a shot was given. This happens very rarely.  Any medication can cause a severe allergic reaction. Such reactions from a vaccine are very rare, estimated at about 1 in a million doses, and would happen within a few minutes to a few hours after the  vaccination. As with any medicine, there is a very remote chance of a vaccine causing a serious injury or death. The safety of vaccines is always being monitored. For more information, visit: http://www.aguilar.org/ 5. What if there is a serious reaction? What should I look for? Look for anything that concerns you, such as signs of a severe allergic reaction, very high fever, or unusual behavior. Signs of a severe allergic reaction can include hives, swelling of the face and throat, difficulty breathing, a fast heartbeat, dizziness, and weakness. These would usually start a few minutes to a few hours after the vaccination. What should I do? If you think it is a severe allergic reaction or other emergency that can't wait, call 9-1-1 or get to the nearest hospital. Otherwise, call your doctor. Afterward, the reaction should be reported to the Vaccine Adverse Event Reporting System (VAERS). Your doctor might file this report, or you can do it yourself through the VAERS web site at www.vaers.SamedayNews.es, or by calling (215)447-9214. VAERS does not give medical advice. 6. How can I learn more?  Ask your doctor. He or she can give you the vaccine package insert or suggest other sources of information.  Call your local or state health department.  Contact the Centers for Disease Control and Prevention (CDC): ? Call 574-704-9328 (1-800-CDC-INFO) or ? Visit CDC's website at http://hunter.com/ CDC Pneumococcal Polysaccharide Vaccine VIS (12/02/13) This information is not intended to replace advice given to you by your health care provider. Make sure you discuss any questions you have with your health care provider. Document Released: 05/25/2006 Document Revised: 04/17/2016 Document Reviewed: 04/17/2016 Elsevier Interactive Patient Education  2017 [employer].

## 2018-05-26 ENCOUNTER — Encounter: Payer: Self-pay | Admitting: Family Medicine

## 2018-05-26 LAB — COMPREHENSIVE METABOLIC PANEL
A/G RATIO: 2.2 (ref 1.2–2.2)
ALK PHOS: 44 IU/L (ref 39–117)
ALT: 18 IU/L (ref 0–44)
AST: 23 IU/L (ref 0–40)
Albumin: 4.4 g/dL (ref 3.5–5.5)
BUN/Creatinine Ratio: 6 — ABNORMAL LOW (ref 9–20)
BUN: 3 mg/dL — ABNORMAL LOW (ref 6–24)
Bilirubin Total: 0.3 mg/dL (ref 0.0–1.2)
CALCIUM: 9 mg/dL (ref 8.7–10.2)
CHLORIDE: 103 mmol/L (ref 96–106)
CO2: 22 mmol/L (ref 20–29)
Creatinine, Ser: 0.53 mg/dL — ABNORMAL LOW (ref 0.76–1.27)
GFR calc Af Amer: 147 mL/min/{1.73_m2} (ref >59)
GFR, EST NON AFRICAN AMERICAN: 127 mL/min/{1.73_m2} (ref >59)
GLOBULIN, TOTAL: 2 g/dL (ref 1.5–4.5)
Glucose: 86 mg/dL (ref 65–99)
POTASSIUM: 4.3 mmol/L (ref 3.5–5.2)
SODIUM: 140 mmol/L (ref 134–144)
Total Protein: 6.4 g/dL (ref 6.0–8.5)

## 2018-05-26 LAB — CBC WITH DIFFERENTIAL/PLATELET
BASOS: 1 %
Basophils Absolute: 0 10*3/uL (ref 0.0–0.2)
EOS (ABSOLUTE): 0.1 10*3/uL (ref 0.0–0.4)
Eos: 1 %
Hematocrit: 43.4 % (ref 37.5–51.0)
Hemoglobin: 14.6 g/dL (ref 13.0–17.7)
IMMATURE GRANULOCYTES: 0 %
Immature Grans (Abs): 0 10*3/uL (ref 0.0–0.1)
Lymphocytes Absolute: 2.1 10*3/uL (ref 0.7–3.1)
Lymphs: 24 %
MCH: 35.5 pg — ABNORMAL HIGH (ref 26.6–33.0)
MCHC: 33.6 g/dL (ref 31.5–35.7)
MCV: 106 fL — AB (ref 79–97)
MONOS ABS: 0.6 10*3/uL (ref 0.1–0.9)
Monocytes: 7 %
NEUTROS PCT: 67 %
Neutrophils Absolute: 6 10*3/uL (ref 1.4–7.0)
Platelets: 223 10*3/uL (ref 150–450)
RBC: 4.11 x10E6/uL — AB (ref 4.14–5.80)
RDW: 11.9 % — AB (ref 12.3–15.4)
WBC: 8.8 10*3/uL (ref 3.4–10.8)

## 2018-05-26 LAB — LIPID PANEL W/O CHOL/HDL RATIO
CHOLESTEROL TOTAL: 225 mg/dL — AB (ref 100–199)
HDL: 108 mg/dL (ref >39)
LDL Calculated: 94 mg/dL (ref 0–99)
TRIGLYCERIDES: 117 mg/dL (ref 0–149)
VLDL Cholesterol Cal: 23 mg/dL (ref 5–40)

## 2018-05-26 LAB — TSH: TSH: 0.415 u[IU]/mL — ABNORMAL LOW (ref 0.450–4.500)

## 2018-05-26 LAB — PSA: Prostate Specific Ag, Serum: 0.9 ng/mL (ref 0.0–4.0)

## 2018-05-29 ENCOUNTER — Other Ambulatory Visit: Payer: Self-pay | Admitting: Family Medicine

## 2018-06-03 ENCOUNTER — Other Ambulatory Visit: Payer: Self-pay | Admitting: Family Medicine

## 2018-06-03 DIAGNOSIS — N63 Unspecified lump in unspecified breast: Secondary | ICD-10-CM

## 2018-06-03 NOTE — Progress Notes (Signed)
Mammogram ordered

## 2018-06-09 ENCOUNTER — Other Ambulatory Visit: Payer: Self-pay | Admitting: Family Medicine

## 2018-06-09 DIAGNOSIS — N6320 Unspecified lump in the left breast, unspecified quadrant: Secondary | ICD-10-CM

## 2018-06-15 ENCOUNTER — Telehealth: Payer: Self-pay | Admitting: Family Medicine

## 2018-06-15 ENCOUNTER — Ambulatory Visit
Admission: RE | Admit: 2018-06-15 | Discharge: 2018-06-15 | Disposition: A | Discharge status: Home / Self Care | Payer: BLUE CROSS/BLUE SHIELD | Source: Ambulatory Visit | Attending: Family Medicine | Admitting: Family Medicine

## 2018-06-15 ENCOUNTER — Other Ambulatory Visit: Payer: Self-pay | Admitting: Family Medicine

## 2018-06-15 DIAGNOSIS — N6320 Unspecified lump in the left breast, unspecified quadrant: Secondary | ICD-10-CM | POA: Diagnosis present

## 2018-06-15 DIAGNOSIS — N63 Unspecified lump in unspecified breast: Secondary | ICD-10-CM

## 2018-06-15 DIAGNOSIS — R928 Other abnormal and inconclusive findings on diagnostic imaging of breast: Secondary | ICD-10-CM

## 2018-06-15 NOTE — Telephone Encounter (Signed)
Called about his mammogram results. Option to go see breast surgeon or to have bx done through breast center. He would like to do bx through breast center. I will sign off on orders ASAP. Call with any concerns.

## 2018-06-22 ENCOUNTER — Ambulatory Visit
Admission: RE | Admit: 2018-06-22 | Discharge: 2018-06-22 | Disposition: A | Discharge status: Home / Self Care | Payer: BLUE CROSS/BLUE SHIELD | Source: Ambulatory Visit | Attending: Family Medicine | Admitting: Family Medicine

## 2018-06-22 DIAGNOSIS — N6320 Unspecified lump in the left breast, unspecified quadrant: Secondary | ICD-10-CM

## 2018-06-22 DIAGNOSIS — R928 Other abnormal and inconclusive findings on diagnostic imaging of breast: Secondary | ICD-10-CM | POA: Diagnosis present

## 2018-06-22 HISTORY — PX: BREAST BIOPSY: SHX20

## 2018-06-23 LAB — SURGICAL PATHOLOGY

## 2018-06-30 ENCOUNTER — Ambulatory Visit: Payer: BLUE CROSS/BLUE SHIELD | Admitting: Surgery

## 2018-06-30 ENCOUNTER — Encounter: Payer: Self-pay | Admitting: Surgery

## 2018-06-30 ENCOUNTER — Other Ambulatory Visit: Payer: Self-pay

## 2018-06-30 VITALS — BP 151/91 | HR 86 | Temp 98.6°F | Resp 18 | Ht 69.6 in | Wt 185.0 lb

## 2018-06-30 DIAGNOSIS — N63 Unspecified lump in unspecified breast: Secondary | ICD-10-CM | POA: Diagnosis not present

## 2018-06-30 NOTE — Patient Instructions (Addendum)
The patient is aware to call back for any questions or new concerns.  Schedule office excision left breast mass

## 2018-07-02 ENCOUNTER — Encounter: Payer: Self-pay | Admitting: Surgery

## 2018-07-02 NOTE — Progress Notes (Signed)
Patient ID: Edward Mckenzie, male   DOB: [birth year], 47 y.o.   MRN: [id number]  HPI Edward Mckenzie is a 47 y.o. male in consultation at the request of Dr. Wynetta Emery for breast nodule.  Ports that he has these for about a year and has slowly increasing in size.  No fevers no chills.  No pain.  No nipple or skin changes on his breast.  He did have ultrasound and mammogram that I have personally reviewed showing evidence of an palpable mass in the left upper outer quadrant. Underwent a biopsy showing evidence of benign fatty tissue with focal necrosis.  No evidence of atypia or dysplasia.  Able to perform more than 4 METS of activity without any shortness of left chest pain.  No weight loss no nausea no vomiting no abdominal pain. Significant history of breast cancer in the family. CBC with a normal hemoglobin and platelets.  CMP also unremarkable.  HPI  Past Medical History:  Diagnosis Date  . Carpal tunnel syndrome of right wrist   . Jones fracture    left foot  . Wears contact lenses     Past Surgical History:  Procedure Laterality Date  . BACK SURGERY    . BREAST BIOPSY Left 06/22/2018   [place] bx BENIGN FIBROFATTY TISSUE WITH DEGENERATIVE CHANGES  . KNEE SURGERY    . NASAL SINUS SURGERY  02/22/98   UPPP, tonguepexy, septo.  Dr. Kathyrn Sheriff. [facility]  . ORIF TOE FRACTURE Left 12/20/2015   Procedure: OPEN REDUCTION INTERNAL FIXATION (ORIF) 5TH METATARSAL (TOE) FRACTURE LEFT;  Surgeon: Albertine Patricia, DPM;  Location: [facility];  Service: Podiatry;  Laterality: Left;  LMA WITH POPLITEAL  . TONSILLECTOMY AND ADENOIDECTOMY      Family History  Problem Relation Age of Onset  . Heart disease Father   . Diabetes Paternal Grandfather   . Heart disease Paternal Grandfather   . Cancer Paternal Grandfather        unknown location  . Breast cancer Neg Hx   . Colon cancer Neg Hx     Social History Social History   Tobacco Use  . Smoking status: Current Every Day Smoker   Packs/day: 1.00    Years: 15.00    Pack years: 15.00    Types: Cigarettes  . Smokeless tobacco: Former Network engineer Use Topics  . Alcohol use: Yes    Alcohol/week: 0.0 standard drinks    Comment: pt states "recently quit".  . Drug use: No    No Known Allergies  Current Outpatient Medications  Medication Sig Dispense Refill  . Ascorbic Acid (VITAMIN C) 100 MG tablet Take 100 mg by mouth daily.    . fluticasone (FLONASE) 50 MCG/ACT nasal spray INSTILL TWO SPRAYS IN EACH NOSTRIL ONCE A DAY 16 g 12  . gabapentin (NEURONTIN) 300 MG capsule Take 1 capsule (300 mg total) by mouth 3 (three) times daily. 270 capsule 1  . lisinopril (PRINIVIL,ZESTRIL) 5 MG tablet Take 1 tablet (5 mg total) by mouth daily. 90 tablet 1  . metroNIDAZOLE (METROGEL) 1 % gel Apply topically daily. 60 g 12  . montelukast (SINGULAIR) 10 MG tablet Take 1 tablet (10 mg total) by mouth at bedtime. 90 tablet 3  . Omega-3 Fatty Acids (FISH OIL) 1000 MG CAPS Take 1,000 mg by mouth daily.    Marland Kitchen thiamine (VITAMIN B-1) 100 MG tablet Take 100 mg by mouth daily.    . traZODone (DESYREL) 100 MG tablet Take 0.5-1 tablets (50-100 mg total) by  mouth at bedtime as needed for sleep. 90 tablet 1   No current facility-administered medications for this visit.      Review of Systems Full ROS  was asked and was negative except for the information on the HPI  Physical Exam Blood pressure (!) 151/91, pulse 86, temperature 98.6 F (37 C), temperature source Skin, resp. rate 18, height 5' 9.6" (1.768 m), weight 185 lb (83.9 kg), SpO2 94 %. CONSTITUTIONAL: NAD  EYES: Pupils are equal, round, and reactive to light, Sclera are non-icteric. EARS, NOSE, MOUTH AND THROAT: The oropharynx is clear. The oral mucosa is pink and moist. Hearing is intact to voice. LYMPH NODES:  Lymph nodes in the neck are normal. RESPIRATORY:  Lungs are clear. There is normal respiratory effort, with equal breath sounds bilaterally, and without pathologic use of  accessory muscles. CARDIOVASCULAR: Heart is regular without murmurs, gallops, or rubs. BREAST: THERE is a 2.5 cm nodule left breast 1 o'clock, mobile. Non tender. No other lesions. No axillary LAD. GI: The abdomen is  soft, nontender, and nondistended. There are no palpable masses. There is no hepatosplenomegaly. There are normal bowel sounds in all quadrants. GU: Rectal deferred.   MUSCULOSKELETAL: Normal muscle strength and tone. No cyanosis or edema.   SKIN: Turgor is good and there are no pathologic skin lesions or ulcers. NEUROLOGIC: Motor and sensation is grossly normal. Cranial nerves are grossly intact. PSYCH:  Oriented to person, place and time. Affect is normal.  Data Reviewed  I have personally reviewed the patient's imaging, laboratory findings and medical records.    Assessment/Plan Palpable left breast mass in a male patient.  I had a lengthy discussion with the patient about his disease process.  The patient is adamant that he wishes to have this performed under local here in the office.  I also offer him to perform this excision in the OR and this will allow [place] to make sure that we have adequate margins and obtain x-ray of the specimen at the same time.  The patient is adamant that he only wants to have it done here in the office.  He understands that if there is positive margins of if we do not get the whole specimen we will have to perform a reexcision in the OR. She discussed with the patient in detail.  Risk benefit and possible complications including but not limited to: Bleeding, infection, positivity of margins.  I have great lesions and the potential need for further interventions including a sentinel lymph node biopsy if in fact this lesion is upgraded. Plan for excision under local in the office. Copy of this report was sent to the referring provider Time spent with the patient was 40 minutes, with more than 50% of the time spent in face-to-face education, counseling and  care coordination.     Caroleen Hamman, MD FACS General Surgeon 07/02/2018, 11:50 AM

## 2018-07-07 ENCOUNTER — Telehealth: Payer: Self-pay | Admitting: *Deleted

## 2018-07-07 ENCOUNTER — Other Ambulatory Visit: Payer: Self-pay

## 2018-07-07 ENCOUNTER — Ambulatory Visit: Payer: BLUE CROSS/BLUE SHIELD | Admitting: Surgery

## 2018-07-07 ENCOUNTER — Encounter: Payer: Self-pay | Admitting: Surgery

## 2018-07-07 VITALS — BP 152/87 | HR 81 | Temp 98.4°F | Resp 18 | Ht 69.0 in | Wt 189.6 lb

## 2018-07-07 DIAGNOSIS — N63 Unspecified lump in unspecified breast: Secondary | ICD-10-CM

## 2018-07-07 MED ORDER — ERYTHROMYCIN BASE 500 MG PO TABS: 1000.0000 mg | ORAL_TABLET | Freq: Three times a day (TID) | ORAL | 0 refills | Status: DC

## 2018-07-07 NOTE — Patient Instructions (Signed)
We have removed a Cyst in our office today.  You have sutures under the skin that will dissolve and also dermabond (skin glue) on top of your skin which will come off on it's own in 10-14 days.  You may shower in 48 hours, this is on 07/09/18.Marland Kitchen  Avoid Strenuous activities that will make you sweat during the next 48 hours to avoid the glue coming off prematurely. Avoid activities that will place pressure to this area of the body for 1-2 weeks to avoid re-injury to incision site.  Please see your follow-up appointment provided. We will see you back in office to make sure this area is healed and to review the final pathology. If you have any questions or concerns prior to this appointment, call our office and speak with a nurse.    Excision of Skin Cysts or Lesions Excision of a skin lesion refers to the removal of a section of skin by making small cuts (incisions) in the skin. This procedure may be done to remove a cancerous (malignant) or noncancerous (benign) growth on the skin. It is typically done to treat or prevent cancer or infection. It may also be done to improve cosmetic appearance. The procedure may be done to remove:  Cancerous growths, such as basal cell carcinoma, squamous cell carcinoma, or melanoma.  Noncancerous growths, such as a cyst or lipoma.  Growths, such as moles or skin tags, which may be removed for cosmetic reasons.  Various excision or surgical techniques may be used depending on your condition, the location of the lesion, and your overall health. Tell a health care provider about:  Any allergies you have.  All medicines you are taking, including vitamins, herbs, eye drops, creams, and over-the-counter medicines.  Any problems you or family members have had with anesthetic medicines.  Any blood disorders you have.  Any surgeries you have had.  Any medical conditions you have.  Whether you are pregnant or may be pregnant. What are the risks? Generally,  this is a safe procedure. However, problems may occur, including:  Bleeding.  Infection.  Scarring.  Recurrence of the cyst, lipoma, or cancer.  Changes in skin sensation or appearance, such as discoloration or swelling.  Reaction to the anesthetics.  Allergic reaction to surgical materials or ointments.  Damage to nerves, blood vessels, muscles, or other structures.  Continued pain.  What happens before the procedure?  Ask your health care provider about: ? Changing or stopping your regular medicines. This is especially important if you are taking diabetes medicines or blood thinners. ? Taking medicines such as aspirin and ibuprofen. These medicines can thin your blood. Do not take these medicines before your procedure if your health care provider instructs you not to.  You may be asked to take certain medicines.  You may be asked to stop smoking.  You may have an exam or testing.  Plan to have someone take you home after the procedure.  Plan to have someone help you with activities during recovery. What happens during the procedure?  To reduce your risk of infection: ? Your health care team will wash or sanitize their hands. ? Your skin will be washed with soap.  You will be given a medicine to numb the area (local anesthetic).  One of the following excision techniques will be performed.  At the end of any of these procedures, antibiotic ointment will be applied as needed. Each of the following techniques may vary among health care providers and hospitals. Complete  Surgical Excision The area of skin that needs to be removed will be marked with a pen. Using a small scalpel or scissors, the surgeon will gently cut around and under the lesion until it is completely removed. The lesion will be placed in a fluid and sent to the lab for examination. If necessary, bleeding will be controlled with a device that delivers heat (electrocautery). The edges of the wound may be  stitched (sutured) together, and a bandage (dressing) will be applied. This procedure may be performed to treat a cancerous growth or a noncancerous cyst or lesion. Excision of a Cyst The surgeon will make an incision on the cyst. The entire cyst will be removed through the incision. The incision may be closed with sutures. Shave Excision During shave excision, the surgeon will use a small blade or an electrically heated loop instrument to shave off the lesion. This may be done to remove a mole or a skin tag. The wound will usually be left to heal on its own without sutures. Punch Excision During punch excision, the surgeon will use a small tool that is like a cookie cutter or a hole punch to cut a circle shape out of the skin. The outer edges of the skin will be sutured together. This may be done to remove a mole or a scar or to perform a biopsy of the lesion. Mohs Micrographic Surgery During Mohs micrographic surgery, layers of the lesion will be removed with a scalpel or a loop instrument and will be examined right away under a microscope. Layers will be removed until all of the abnormal or cancerous tissue has been removed. This procedure is minimally invasive, and it ensures the best cosmetic outcome. It involves the removal of as little normal tissue as possible. Mohs is usually done to treat skin cancer, such as basal cell carcinoma or squamous cell carcinoma, particularly on the face and ears. Depending on the size of the surgical wound, it may be sutured closed. What happens after the procedure?  Return to your normal activities as told by your health care provider.  Talk with your health care provider to discuss any test results, treatment options, and if necessary, the need for more tests. This information is not intended to replace advice given to you by your health care provider. Make sure you discuss any questions you have with your health care provider. Document Released: 10/22/2009  Document Revised: 01/03/2016 Document Reviewed: 09/13/2014 Elsevier Interactive Patient Education  [employer].

## 2018-07-07 NOTE — Telephone Encounter (Signed)
Spoke with Syndey at [facility] to disregard erythromycin order. Placed in error.

## 2018-07-12 ENCOUNTER — Encounter: Payer: Self-pay | Admitting: Surgery

## 2018-07-12 NOTE — Progress Notes (Signed)
PROCEDURE NOTE 1.Left Breast lumpectomy  Diagnosis: left breast mass  Anesthesia: lidocaine 1% w epi  Complications: none  EBL : minimal  Findings; bening mass c/w EIC  47 year old male with a left breast mass with previous biopsy showing benign disease.  In need for excision.  Discussed with the patient detail about the procedure and I did offer him to do a in the OR under general anesthetic.  Patient was adamant that he wanted to do this under local .  After informed consent was obtained the patient was prepped and draped in the usual sterile fashion lidocaine was injected and a perioral incision was created with a 15 blade knife.  Using Metzenbaum scissors were able to dissect the mass from adjacent structure and breast tissue.  Hemostasis was obtained with electrocautery.  The mass was found to be an epidermal inclusion cyst and was sent for pathology.  The wound was closed in layers with a 2-0 Vicryl for the breast tissue and a 4-0 Monocryl for the skin.  Dermabond was used.  No complications

## 2018-07-15 ENCOUNTER — Telehealth: Payer: Self-pay

## 2018-07-15 NOTE — Telephone Encounter (Signed)
Left message for patient to call office regarding pathology results. Epidermal inclusion cyst, ruptured. No malignancies. Keep follow up appointment 07/19/18 with Dr.Pabon.

## 2018-07-15 NOTE — Telephone Encounter (Signed)
Patient called back and was provided results of pathology. Reminded to keep appointment 07/19/18 with Dr.Pabon.

## 2018-07-19 ENCOUNTER — Ambulatory Visit: Payer: BLUE CROSS/BLUE SHIELD | Admitting: Surgery

## 2018-07-19 ENCOUNTER — Encounter: Payer: Self-pay | Admitting: Surgery

## 2018-07-19 ENCOUNTER — Other Ambulatory Visit: Payer: Self-pay

## 2018-07-19 VITALS — BP 146/83 | HR 93 | Temp 99.0°F | Resp 20 | Ht 69.0 in | Wt 184.2 lb

## 2018-07-19 DIAGNOSIS — N63 Unspecified lump in unspecified breast: Secondary | ICD-10-CM

## 2018-07-19 DIAGNOSIS — Z09 Encounter for follow-up examination after completed treatment for conditions other than malignant neoplasm: Secondary | ICD-10-CM

## 2018-07-19 DIAGNOSIS — N6320 Unspecified lump in the left breast, unspecified quadrant: Secondary | ICD-10-CM

## 2018-07-19 NOTE — Patient Instructions (Signed)
Patient is to return to the office as needed.  Call the office with any questions or concerns. 

## 2018-07-19 NOTE — Progress Notes (Signed)
S/p excision EIC left breast Doing well No issues  PE NAD Wound healing well, no seroma or infection  A/ p Doing well No complications Path d/w pt RTC prn

## 2018-11-13 ENCOUNTER — Other Ambulatory Visit: Payer: Self-pay | Admitting: Family Medicine

## 2018-11-24 ENCOUNTER — Other Ambulatory Visit: Payer: Self-pay

## 2018-11-24 ENCOUNTER — Encounter: Payer: Self-pay | Admitting: Family Medicine

## 2018-11-24 ENCOUNTER — Ambulatory Visit (INDEPENDENT_AMBULATORY_CARE_PROVIDER_SITE_OTHER): Payer: BLUE CROSS/BLUE SHIELD | Admitting: Family Medicine

## 2018-11-24 VITALS — BP 139/68 | HR 86 | Wt 180.0 lb

## 2018-11-24 DIAGNOSIS — Z114 Encounter for screening for human immunodeficiency virus [HIV]: Secondary | ICD-10-CM

## 2018-11-24 DIAGNOSIS — I10 Essential (primary) hypertension: Secondary | ICD-10-CM

## 2018-11-24 DIAGNOSIS — H1013 Acute atopic conjunctivitis, bilateral: Secondary | ICD-10-CM

## 2018-11-24 DIAGNOSIS — R7989 Other specified abnormal findings of blood chemistry: Secondary | ICD-10-CM | POA: Diagnosis not present

## 2018-11-24 DIAGNOSIS — G4700 Insomnia, unspecified: Secondary | ICD-10-CM | POA: Diagnosis not present

## 2018-11-24 MED ORDER — MONTELUKAST SODIUM 10 MG PO TABS: 10.0000 mg | ORAL_TABLET | Freq: Every day | ORAL | 3 refills | Status: DC

## 2018-11-24 MED ORDER — LISINOPRIL 5 MG PO TABS: 5.0000 mg | ORAL_TABLET | Freq: Every day | ORAL | 1 refills | Status: DC

## 2018-11-24 MED ORDER — CETIRIZINE HCL 10 MG PO TABS: 10.0000 mg | ORAL_TABLET | Freq: Every day | ORAL | 4 refills | Status: DC

## 2018-11-24 MED ORDER — GABAPENTIN 300 MG PO CAPS: 300.0000 mg | ORAL_CAPSULE | Freq: Three times a day (TID) | ORAL | 1 refills | Status: DC

## 2018-11-24 MED ORDER — FLUTICASONE PROPIONATE 50 MCG/ACT NA SUSP: NASAL | 12 refills | Status: DC

## 2018-11-24 MED ORDER — ERYTHROMYCIN 5 MG/GM OP OINT: 1.0000 "application " | TOPICAL_OINTMENT | Freq: Every day | OPHTHALMIC | 0 refills | Status: DC

## 2018-11-24 MED ORDER — DOXYCYCLINE HYCLATE 100 MG PO TABS: 100.0000 mg | ORAL_TABLET | Freq: Every day | ORAL | 1 refills | Status: DC

## 2018-11-24 NOTE — Progress Notes (Signed)
BP 139/68   Pulse 86   Wt 180 lb (81.6 kg)   BMI 26.58 kg/m    Subjective:    Patient ID: Edward Mckenzie, male    DOB: 1971/07/17, 48 y.o.   MRN: [id number]  HPI: Edward Mckenzie is a 48 y.o. male  Chief Complaint  Patient presents with  . Hypertension  . Insomnia  . Allergies   HYPERTENSION Hypertension status: controlled  Satisfied with current treatment? yes Duration of hypertension: chronic BP monitoring frequency:  not checking BP range:  BP medication side effects:  no Medication compliance: excellent compliance Previous BP meds: lisinopril Aspirin: no Recurrent headaches: no Visual changes: no Palpitations: no Dyspnea: no Chest pain: no Lower extremity edema: no Dizzy/lightheaded: no  INSOMNIA Duration: chronic Satisfied with sleep quality: yes Difficulty falling asleep: no Difficulty staying asleep: no Waking a few hours after sleep onset: no Early morning awakenings: no Daytime hypersomnolence: no Wakes feeling refreshed: no Good sleep hygiene: no Apnea: no Snoring: no Depressed/anxious mood: no Recent stress: yes Restless legs/nocturnal leg cramps: no Chronic pain/arthritis: no History of sleep study: no Treatments attempted: trazodone, melatonin, uinsom and benadryl   Has been having a lot of problems with allergies with his eyes. Has been having crusting and matting on his eyes, itching and red. No other concerns or complaints at this time.    Relevant past Edward, surgical, family and social history reviewed and updated as indicated. Interim Edward history since our last visit reviewed. Allergies and medications reviewed and updated.  Review of Systems  Constitutional: Negative.   HENT: Negative.   Eyes: Positive for discharge, redness and itching. Negative for photophobia, pain and visual disturbance.  Respiratory: Negative.   Cardiovascular: Negative.   Musculoskeletal: Negative.   Neurological: Negative.    Psychiatric/Behavioral: Negative.     Per HPI unless specifically indicated above     Objective:    BP 139/68   Pulse 86   Wt 180 lb (81.6 kg)   BMI 26.58 kg/m   Wt Readings from Last 3 Encounters:  11/24/18 180 lb (81.6 kg)  07/19/18 184 lb 3.2 oz (83.6 kg)  07/07/18 189 lb 9.6 oz (86 kg)    Physical Exam Vitals signs and nursing note reviewed.  Constitutional:      General: He is not in acute distress.    Appearance: Normal appearance. He is not ill-appearing, toxic-appearing or diaphoretic.  HENT:     Head: Normocephalic and atraumatic.     Right Ear: External ear normal.     Left Ear: External ear normal.     Nose: Nose normal.     Mouth/Throat:     Mouth: Mucous membranes are moist.     Pharynx: Oropharynx is clear.  Eyes:     General: No scleral icterus.       Right eye: No discharge.        Left eye: No discharge.     Conjunctiva/sclera: Conjunctivae normal.     Pupils: Pupils are equal, round, and reactive to light.     Comments: Irritation along eyelash line with stye in mid L upper eyelid  Neck:     Musculoskeletal: Normal range of motion.  Pulmonary:     Effort: Pulmonary effort is normal. No respiratory distress.     Comments: Speaking in full sentences Musculoskeletal: Normal range of motion.  Skin:    Coloration: Skin is not jaundiced or pale.     Findings: No bruising, erythema, lesion or rash.  Neurological:     Mental Status: He is alert and oriented to person, place, and time. Mental status is at baseline.  Psychiatric:        Mood and Affect: Mood normal.        Behavior: Behavior normal.        Thought Content: Thought content normal.        Judgment: Judgment normal.     Results for orders placed or performed during the hospital encounter of 06/22/18  Surgical pathology  Result Value Ref Range   SURGICAL PATHOLOGY      Surgical Pathology CASE: [id number] PATIENT: Edward Mckenzie Surgical Pathology Report     SPECIMEN  SUBMITTED: A. Breast, left  CLINICAL HISTORY: Enlarging 2.6 cm mass  PRE-OPERATIVE DIAGNOSIS: Prob B9 mass.  R/O malignancy  POST-OPERATIVE DIAGNOSIS: None provided.     DIAGNOSIS: A. BREAST, LEFT AT 1:00, 1 CM FROM NIPPLE; ULTRASOUND-GUIDED CORE BIOPSY: - BENIGN FIBROFATTY TISSUE WITH DEGENERATIVE CHANGES, FOCAL FAT NECROSIS, AND PALISADING GIANT CELL REACTION. - NO EVIDENCE OF ATYPICAL PROLIFERATIVE BREAST DISEASE.  Comment: The histologic findings appear to represent degenerating fatty tissue with a surrounding inflammatory response. The differential diagnosis may include a lipoma with degenerative changes, or resolving fat necrosis secondary to prior trauma. Clinical correlation is recommended.   GROSS DESCRIPTION: A. Labeled: Left breast 1:00, 1 cm from nipple Received: In formalin Time/date in fixative: 8:40 AM on 06/22/2018 Cold isc hemic time: Less than 1 minutes Total fixation time: 9 hours Core pieces: Multiple Size: Aggregate, 2.1 x 0.6 x 0.1 cm Description: Friable tan tissue fragments Ink color: Blue Entirely submitted in one cassette.     Final Diagnosis performed by Allena Napoleon, MD.   Electronically signed 06/23/2018 1:55:58PM The electronic signature indicates that the named Attending Pathologist has evaluated the specimen  Technical component performed at [facility], [address], [place], [place] [address] Lab: [phone] Dir: Rush Farmer, MD, MMM  Professional component performed at [facility], [place], [place], [place] [address] Lab: [phone] Dir: Dellia Nims. Reuel Derby, MD       Assessment & Plan:   Problem List Items Addressed This Visit      Cardiovascular and Mediastinum   HTN (hypertension) - Primary    Under good control on current regimen. Continue current regimen. Continue to monitor. Call with any concerns. Refills given. Labs to be drawn.        Relevant Medications   lisinopril  (PRINIVIL,ZESTRIL) 5 MG tablet   Other Relevant Orders   Basic metabolic panel     Other   Insomnia    Under good control on current regimen. Continue current regimen. Continue to monitor. Call with any concerns. Refills given.         Other Visit Diagnoses    Allergic conjunctivitis of both eyes       Continue to follow with opthalmology. Will treat with erythromycin and start zyrtec. Call with any concerns. Continue to monitor.    Abnormal thyroid blood test       Due for recheck on labs. Ordered today. Await results.    Relevant Orders   TSH   Screening for HIV without presence of risk factors       Labs to be drawn. Await results.    Relevant Orders   HIV Antibody (routine testing w rflx)       Follow up plan: Return in about 6 months (around 05/26/2019) for Physical.    . This visit was completed  via FaceTime due to the restrictions of the COVID-19 pandemic. All issues as above were discussed and addressed. Physical exam was done as above through visual confirmation on FaceTime. If it was felt that the patient should be evaluated in the office, they were directed there. The patient verbally consented to this visit. . Location of the patient: home . Location of the provider: work . Those involved with this call:  . Provider: Park Liter, DO . CMA: Gerda Diss, CMA . Front Desk/Registration: Don Perking  . Time spent on call: 25 minutes with patient face to face via video conference. More than 50% of this time was spent in counseling and coordination of care. 40 minutes total spent in review of patient's record and preparation of their chart.

## 2018-11-24 NOTE — Assessment & Plan Note (Signed)
Under good control on current regimen. Continue current regimen. Continue to monitor. Call with any concerns. Refills given. Labs to be drawn.  

## 2018-11-24 NOTE — Assessment & Plan Note (Signed)
Under good control on current regimen. Continue current regimen. Continue to monitor. Call with any concerns. Refills given.   

## 2018-11-29 ENCOUNTER — Other Ambulatory Visit: Payer: Self-pay

## 2018-11-29 ENCOUNTER — Other Ambulatory Visit: Payer: BLUE CROSS/BLUE SHIELD

## 2018-11-29 DIAGNOSIS — R7989 Other specified abnormal findings of blood chemistry: Secondary | ICD-10-CM

## 2018-11-29 DIAGNOSIS — Z114 Encounter for screening for human immunodeficiency virus [HIV]: Secondary | ICD-10-CM

## 2018-11-29 DIAGNOSIS — I10 Essential (primary) hypertension: Secondary | ICD-10-CM

## 2018-11-30 ENCOUNTER — Encounter: Payer: Self-pay | Admitting: Family Medicine

## 2018-11-30 LAB — BASIC METABOLIC PANEL
BUN/Creatinine Ratio: 8 — ABNORMAL LOW (ref 9–20)
BUN: 5 mg/dL — ABNORMAL LOW (ref 6–24)
CO2: 22 mmol/L (ref 20–29)
Calcium: 9.4 mg/dL (ref 8.7–10.2)
Chloride: 98 mmol/L (ref 96–106)
Creatinine, Ser: 0.65 mg/dL — ABNORMAL LOW (ref 0.76–1.27)
GFR calc Af Amer: 134 mL/min/{1.73_m2} (ref >59)
GFR calc non Af Amer: 116 mL/min/{1.73_m2} (ref >59)
Glucose: 74 mg/dL (ref 65–99)
Potassium: 4.8 mmol/L (ref 3.5–5.2)
Sodium: 137 mmol/L (ref 134–144)

## 2018-11-30 LAB — TSH: TSH: 0.613 u[IU]/mL (ref 0.450–4.500)

## 2018-11-30 LAB — HIV ANTIBODY (ROUTINE TESTING W REFLEX): HIV Screen 4th Generation wRfx: NONREACTIVE

## 2019-01-08 ENCOUNTER — Other Ambulatory Visit: Payer: Self-pay | Admitting: Family Medicine

## 2019-01-08 NOTE — Telephone Encounter (Signed)
Medication is off protocol / Sending to provider for review

## 2019-02-17 ENCOUNTER — Other Ambulatory Visit: Payer: BLUE CROSS/BLUE SHIELD

## 2019-02-17 ENCOUNTER — Telehealth: Payer: Self-pay | Admitting: *Deleted

## 2019-02-17 DIAGNOSIS — Z20822 Contact with and (suspected) exposure to covid-19: Secondary | ICD-10-CM

## 2019-02-17 NOTE — Telephone Encounter (Signed)
Appointment made for [phone] testing today @ [facility] site at 9:15a. Informed to wear a mask and stay in vehicle.

## 2019-02-22 LAB — NOVEL CORONAVIRUS, NAA: SARS-CoV-2, NAA: NOT DETECTED

## 2019-03-25 ENCOUNTER — Other Ambulatory Visit: Payer: Self-pay | Admitting: Family Medicine

## 2019-03-25 NOTE — Telephone Encounter (Signed)
Requested Prescriptions  Pending Prescriptions Disp Refills  . traZODone (DESYREL) 100 MG tablet [Pharmacy Med Name: TRAZODONE 100 MG TABLET] 90 tablet 0    Sig: TAKE 0.5-1 TABLETS (50-100 MG TOTAL) BY MOUTH AT BEDTIME AS NEEDED FOR SLEEP.     Psychiatry: Antidepressants - Serotonin Modulator Failed - 03/25/2019  4:04 PM      Failed - Completed PHQ-2 or PHQ-9 in the last 360 days.      Passed - Valid encounter within last 6 months    Recent Outpatient Visits          4 months ago Essential hypertension   [place], Megan P, DO   10 months ago Routine general medical examination at a health care facility   [place], [place], DO   1 year ago Visit for suture removal   [place], [place], DO   1 year ago Epidermal cyst   [facility] Valerie Roys, DO   1 year ago Essential hypertension   [place], [place], DO      Future Appointments            In 2 months Wynetta Emery, Barb Merino, DO [facility]

## 2019-05-27 ENCOUNTER — Encounter: Payer: BLUE CROSS/BLUE SHIELD | Admitting: Family Medicine

## 2019-07-05 ENCOUNTER — Ambulatory Visit (INDEPENDENT_AMBULATORY_CARE_PROVIDER_SITE_OTHER): Payer: BC Managed Care – PPO | Admitting: Family Medicine

## 2019-07-05 ENCOUNTER — Encounter: Payer: Self-pay | Admitting: Family Medicine

## 2019-07-05 ENCOUNTER — Other Ambulatory Visit: Payer: Self-pay

## 2019-07-05 VITALS — BP 125/82 | HR 81 | Temp 98.5°F | Ht 67.56 in | Wt 180.4 lb

## 2019-07-05 DIAGNOSIS — L301 Dyshidrosis [pompholyx]: Secondary | ICD-10-CM

## 2019-07-05 DIAGNOSIS — Z0000 Encounter for general adult medical examination without abnormal findings: Secondary | ICD-10-CM | POA: Diagnosis not present

## 2019-07-05 DIAGNOSIS — I10 Essential (primary) hypertension: Secondary | ICD-10-CM

## 2019-07-05 DIAGNOSIS — G4700 Insomnia, unspecified: Secondary | ICD-10-CM | POA: Diagnosis not present

## 2019-07-05 LAB — UA/M W/RFLX CULTURE, ROUTINE
Bilirubin, UA: NEGATIVE
Glucose, UA: NEGATIVE
Ketones, UA: NEGATIVE
Leukocytes,UA: NEGATIVE
Nitrite, UA: NEGATIVE
Protein,UA: NEGATIVE
RBC, UA: NEGATIVE
Specific Gravity, UA: 1.015 (ref 1.005–1.030)
Urobilinogen, Ur: 0.2 mg/dL (ref 0.2–1.0)
pH, UA: 7 (ref 5.0–7.5)

## 2019-07-05 LAB — MICROALBUMIN, URINE WAIVED
Creatinine, Urine Waived: 10 mg/dL (ref 10–300)
Microalb, Ur Waived: 10 mg/L (ref 0–19)
Microalb/Creat Ratio: <30 mg/g (ref <30)

## 2019-07-05 MED ORDER — TRAZODONE HCL 100 MG PO TABS: 50.0000 mg | ORAL_TABLET | Freq: Every evening | ORAL | 1 refills | Status: DC | PRN

## 2019-07-05 MED ORDER — GABAPENTIN 300 MG PO CAPS: 300.0000 mg | ORAL_CAPSULE | Freq: Three times a day (TID) | ORAL | 1 refills | Status: DC

## 2019-07-05 MED ORDER — TRIAMCINOLONE ACETONIDE 0.5 % EX OINT: 1.0000 "application " | TOPICAL_OINTMENT | Freq: Two times a day (BID) | CUTANEOUS | 3 refills | Status: DC

## 2019-07-05 MED ORDER — LISINOPRIL 5 MG PO TABS: 5.0000 mg | ORAL_TABLET | Freq: Every day | ORAL | 1 refills | Status: DC

## 2019-07-05 MED ORDER — TRAZODONE HCL 100 MG PO TABS: 50.0000 mg | ORAL_TABLET | Freq: Every evening | ORAL | 0 refills | Status: DC | PRN

## 2019-07-05 MED ORDER — METRONIDAZOLE 1 % EX GEL: Freq: Every day | CUTANEOUS | 12 refills | Status: DC

## 2019-07-05 NOTE — Patient Instructions (Signed)

## 2019-07-05 NOTE — Assessment & Plan Note (Signed)
Better on recheck. Continue diet and exercise. Refills given today. Labs drawn. Recheck 6 months.

## 2019-07-05 NOTE — Assessment & Plan Note (Signed)
Under good control on current regimen. Continue current regimen. Continue to monitor. Call with any concerns. Refills given. Labs drawn today.   

## 2019-07-05 NOTE — Progress Notes (Signed)
BP 125/82   Pulse 81   Temp 98.5 F (36.9 C)   Ht 5' 7.56" (1.716 m)   Wt 180 lb 6 oz (81.8 kg)   SpO2 97%   BMI 27.79 kg/m    Subjective:    Patient ID: Edward Mckenzie, male    DOB: October 03, 1970, 48 y.o.   MRN: [id number]  HPI: Edward Mckenzie is a 48 y.o. male presenting on 07/05/2019 for comprehensive medical examination. Current medical complaints include:  HYPERTENSION Hypertension status: stable  Satisfied with current treatment? yes Duration of hypertension: chronic BP monitoring frequency:  not checking BP range:  BP medication side effects:  no Medication compliance: excellent compliance Previous BP meds: lisinopril Aspirin: no Recurrent headaches: no Visual changes: no Palpitations: no Dyspnea: no Chest pain: no Lower extremity edema: no Dizzy/lightheaded: no  INSOMNIA Duration: chronic Satisfied with sleep quality: yes Difficulty falling asleep: no Difficulty staying asleep: no Waking a few hours after sleep onset: no Early morning awakenings: no Daytime hypersomnolence: no Wakes feeling refreshed: yes Good sleep hygiene: no Apnea: no Snoring: no Depressed/anxious mood: no Recent stress: yes Restless legs/nocturnal leg cramps: no Chronic pain/arthritis: no History of sleep study: no  Interim Problems from his last visit: no  Depression Screen done today and results listed below:  Depression screen [facility] 2/9 07/05/2019 05/25/2018 05/21/2017 01/24/2016  Decreased Interest 0 0 0 0  Down, Depressed, Hopeless 0 0 0 0  PHQ - 2 Score 0 0 0 0  Altered sleeping 0 2 - 3  Tired, decreased energy 0 0 - 2  Change in appetite 0 0 - 0  Feeling bad or failure about yourself  0 0 - 0  Trouble concentrating 0 0 - 0  Moving slowly or fidgety/restless 0 0 - 0  Suicidal thoughts 0 0 - 0  PHQ-9 Score 0 2 - 5  Difficult doing work/chores Not difficult at all Somewhat difficult - -    Past Medical History:  Past Medical History:  Diagnosis Date   . Carpal tunnel syndrome of right wrist   . Jones fracture    left foot  . Wears contact lenses     Surgical History:  Past Surgical History:  Procedure Laterality Date  . BACK SURGERY    . BREAST BIOPSY Left 06/22/2018   [place] bx BENIGN FIBROFATTY TISSUE WITH DEGENERATIVE CHANGES  . KNEE SURGERY    . NASAL SINUS SURGERY  02/22/98   UPPP, tonguepexy, septo.  Dr. Kathyrn Sheriff. [place]  . ORIF TOE FRACTURE Left 12/20/2015   Procedure: OPEN REDUCTION INTERNAL FIXATION (ORIF) 5TH METATARSAL (TOE) FRACTURE LEFT;  Surgeon: Albertine Patricia, DPM;  Location: [facility];  Service: Podiatry;  Laterality: Left;  LMA WITH POPLITEAL  . TONSILLECTOMY AND ADENOIDECTOMY      Medications:  Current Outpatient Medications on File Prior to Visit  Medication Sig  . Ascorbic Acid (VITAMIN C) 100 MG tablet Take 100 mg by mouth daily.  Marland Kitchen azelastine (ASTELIN) 0.1 % nasal spray   . cetirizine (ZYRTEC) 10 MG tablet Take 1 tablet (10 mg total) by mouth daily.  . fluticasone (FLONASE) 50 MCG/ACT nasal spray INSTILL TWO SPRAYS IN EACH NOSTRIL ONCE A DAY  . montelukast (SINGULAIR) 10 MG tablet Take 1 tablet (10 mg total) by mouth at bedtime.  . Multiple Vitamin (ONE-A-DAY MENS PO) Take by mouth.  . neomycin-polymyxin b-dexamethasone (MAXITROL) 3.5-10000-0.1 SUSP PLACE 1 DROP IN BOTH EYES 4 TIMES A DAY FOR 2 WEEKS  . Omega-3 Fatty Acids (  FISH OIL) 1000 MG CAPS Take 1,000 mg by mouth daily.  Marland Kitchen thiamine (VITAMIN B-1) 100 MG tablet Take 100 mg by mouth daily.  Marland Kitchen tobramycin-dexamethasone (TOBRADEX) ophthalmic solution PUT 1 DROP INTO INTO RIGHT EYE 4 TIMES A DAY FOR 2 WEEKS   No current facility-administered medications on file prior to visit.     Allergies:  No Known Allergies  Social History:  Social History   Socioeconomic History  . Marital status: Single    Spouse name: Not on file  . Number of children: Not on file  . Years of education: Not on file  . Highest education level: Not on file   Occupational History  . Not on file  Social Needs  . Financial resource strain: Not on file  . Food insecurity    Worry: Not on file    Inability: Not on file  . Transportation needs    Medical: Not on file    Non-medical: Not on file  Tobacco Use  . Smoking status: Current Every Day Smoker    Packs/day: 1.00    Years: 15.00    Pack years: 15.00    Types: Cigarettes  . Smokeless tobacco: Former Network engineer and Sexual Activity  . Alcohol use: Yes    Alcohol/week: 0.0 standard drinks    Comment: pt states "recently quit".  . Drug use: No  . Sexual activity: Yes  Lifestyle  . Physical activity    Days per week: Not on file    Minutes per session: Not on file  . Stress: Not on file  Relationships  . Social Herbalist on phone: Not on file    Gets together: Not on file    Attends religious service: Not on file    Active member of club or organization: Not on file    Attends meetings of clubs or organizations: Not on file    Relationship status: Not on file  . Intimate partner violence    Fear of current or ex partner: Not on file    Emotionally abused: Not on file    Physically abused: Not on file    Forced sexual activity: Not on file  Other Topics Concern  . Not on file  Social History Narrative  . Not on file   Social History   Tobacco Use  Smoking Status Current Every Day Smoker  . Packs/day: 1.00  . Years: 15.00  . Pack years: 15.00  . Types: Cigarettes  Smokeless Tobacco Former Systems developer   Social History   Substance and Sexual Activity  Alcohol Use Yes  . Alcohol/week: 0.0 standard drinks   Comment: pt states "recently quit".    Family History:  Family History  Problem Relation Age of Onset  . Heart disease Father   . Diabetes Paternal Grandfather   . Heart disease Paternal Grandfather   . Cancer Paternal Grandfather        unknown location  . Breast cancer Neg Hx   . Colon cancer Neg Hx     Past medical history, surgical history,  medications, allergies, family history and social history reviewed with patient today and changes made to appropriate areas of the chart.   Review of Systems  Constitutional: Negative.   HENT: Negative.   Eyes: Negative.   Respiratory: Negative.   Cardiovascular: Negative.   Gastrointestinal: Negative.   Genitourinary: Negative.   Musculoskeletal: Negative.   Skin: Positive for rash. Negative for itching.  Neurological: Negative.   Endo/Heme/Allergies:  Positive for environmental allergies. Negative for polydipsia. Bruises/bleeds easily.  Psychiatric/Behavioral: Negative.     All other ROS negative except what is listed above and in the HPI.      Objective:    BP 125/82   Pulse 81   Temp 98.5 F (36.9 C)   Ht 5' 7.56" (1.716 m)   Wt 180 lb 6 oz (81.8 kg)   SpO2 97%   BMI 27.79 kg/m   Wt Readings from Last 3 Encounters:  07/05/19 180 lb 6 oz (81.8 kg)  11/24/18 180 lb (81.6 kg)  07/19/18 184 lb 3.2 oz (83.6 kg)    Physical Exam Vitals signs and nursing note reviewed.  Constitutional:      General: He is not in acute distress.    Appearance: Normal appearance. He is normal weight. He is not ill-appearing, toxic-appearing or diaphoretic.  HENT:     Head: Normocephalic and atraumatic.     Right Ear: Tympanic membrane, ear canal and external ear normal. There is no impacted cerumen.     Left Ear: Tympanic membrane, ear canal and external ear normal. There is no impacted cerumen.     Nose: Nose normal. No congestion or rhinorrhea.     Mouth/Throat:     Mouth: Mucous membranes are moist.     Pharynx: Oropharynx is clear. No oropharyngeal exudate or posterior oropharyngeal erythema.  Eyes:     General: No scleral icterus.       Right eye: No discharge.        Left eye: No discharge.     Extraocular Movements: Extraocular movements intact.     Conjunctiva/sclera: Conjunctivae normal.     Pupils: Pupils are equal, round, and reactive to light.  Neck:     Musculoskeletal:  Normal range of motion and neck supple. No neck rigidity or muscular tenderness.     Vascular: No carotid bruit.  Cardiovascular:     Rate and Rhythm: Normal rate and regular rhythm.     Pulses: Normal pulses.     Heart sounds: No murmur. No friction rub. No gallop.   Pulmonary:     Effort: Pulmonary effort is normal. No respiratory distress.     Breath sounds: Normal breath sounds. No stridor. No wheezing, rhonchi or rales.  Chest:     Chest wall: No tenderness.  Abdominal:     General: Abdomen is flat. Bowel sounds are normal. There is no distension.     Palpations: Abdomen is soft. There is no mass.     Tenderness: There is no abdominal tenderness. There is no right CVA tenderness, left CVA tenderness, guarding or rebound.     Hernia: No hernia is present.  Genitourinary:    Comments: Genital exam deferred with shared decision making Musculoskeletal:        General: No swelling, tenderness, deformity or signs of injury.     Right lower leg: No edema.     Left lower leg: No edema.  Lymphadenopathy:     Cervical: No cervical adenopathy.  Skin:    General: Skin is warm and dry.     Capillary Refill: Capillary refill takes less than 2 seconds.     Coloration: Skin is not jaundiced or pale.     Findings: No bruising, erythema, lesion or rash.  Neurological:     General: No focal deficit present.     Mental Status: He is alert and oriented to person, place, and time.     Cranial Nerves: No cranial  nerve deficit.     Sensory: No sensory deficit.     Motor: No weakness.     Coordination: Coordination normal.     Gait: Gait normal.     Deep Tendon Reflexes: Reflexes normal.  Psychiatric:        Mood and Affect: Mood normal.        Behavior: Behavior normal.        Thought Content: Thought content normal.        Judgment: Judgment normal.     Results for orders placed or performed in visit on 02/17/19  Novel Coronavirus, NAA ([facility])  Result Value Ref Range   SARS-CoV-2,  NAA Not Detected Not Detected      Assessment & Plan:   Problem List Items Addressed This Visit      Cardiovascular and Mediastinum   HTN (hypertension)    Better on recheck. Continue diet and exercise. Refills given today. Labs drawn. Recheck 6 months.       Relevant Medications   lisinopril (ZESTRIL) 5 MG tablet   Other Relevant Orders   CBC with Differential OUT   Comp Met (CMET)   Microalbumin, Urine Waived   UA/M w/rflx Culture, Routine     Other   Insomnia    Under good control on current regimen. Continue current regimen. Continue to monitor. Call with any concerns. Refills given. Labs drawn today.       Relevant Orders   TSH    Other Visit Diagnoses    Routine general medical examination at a health care facility    -  Primary   Vaccines up to date/declined. Screening labs checked today. Continue diet and exercise. Call with any concerns.    Relevant Orders   CBC with Differential OUT   Comp Met (CMET)   Lipid Panel w/o Chol/HDL Ratio OUT   Microalbumin, Urine Waived   TSH   UA/M w/rflx Culture, Routine   Dyshidrotic eczema       Will treat with triamcinalone. Call if not getting better or getting worse.       LABORATORY TESTING:  Health maintenance labs ordered today as discussed above.   IMMUNIZATIONS:   - Tdap: Tetanus vaccination status reviewed: last tetanus booster within 10 years. - Influenza: Refused - Pneumovax: Up to date  PATIENT COUNSELING:    Sexuality: Discussed sexually transmitted diseases, partner selection, use of condoms, avoidance of unintended pregnancy  and contraceptive alternatives.   Advised to avoid cigarette smoking.  I discussed with the patient that most people either abstain from alcohol or drink within safe limits (<=14/week and <=4 drinks/occasion for males, <=7/weeks and <= 3 drinks/occasion for females) and that the risk for alcohol disorders and other health effects rises proportionally with the number of drinks per  week and how often a drinker exceeds daily limits.  Discussed cessation/primary prevention of drug use and availability of treatment for abuse.   Diet: Encouraged to adjust caloric intake to maintain  or achieve ideal body weight, to reduce intake of dietary saturated fat and total fat, to limit sodium intake by avoiding high sodium foods and not adding table salt, and to maintain adequate dietary potassium and calcium preferably from fresh fruits, vegetables, and low-fat dairy products.    stressed the importance of regular exercise  Injury prevention: Discussed safety belts, safety helmets, smoke detector, smoking near bedding or upholstery.   Dental health: Discussed importance of regular tooth brushing, flossing, and dental visits.   Follow up plan: NEXT PREVENTATIVE  PHYSICAL DUE IN 1 YEAR. Return in about 6 months (around 01/02/2020).

## 2019-07-06 ENCOUNTER — Encounter: Payer: Self-pay | Admitting: Family Medicine

## 2019-07-06 LAB — CBC WITH DIFFERENTIAL/PLATELET
Basophils Absolute: 0 10*3/uL (ref 0.0–0.2)
Basos: 0 %
EOS (ABSOLUTE): 0 10*3/uL (ref 0.0–0.4)
Eos: 0 %
Hematocrit: 42.8 % (ref 37.5–51.0)
Hemoglobin: 15 g/dL (ref 13.0–17.7)
Immature Grans (Abs): 0 10*3/uL (ref 0.0–0.1)
Immature Granulocytes: 0 %
Lymphocytes Absolute: 2.9 10*3/uL (ref 0.7–3.1)
Lymphs: 40 %
MCH: 36.7 pg — ABNORMAL HIGH (ref 26.6–33.0)
MCHC: 35 g/dL (ref 31.5–35.7)
MCV: 105 fL — ABNORMAL HIGH (ref 79–97)
Monocytes Absolute: 0.8 10*3/uL (ref 0.1–0.9)
Monocytes: 11 %
Neutrophils Absolute: 3.4 10*3/uL (ref 1.4–7.0)
Neutrophils: 49 %
Platelets: 200 10*3/uL (ref 150–450)
RBC: 4.09 x10E6/uL — ABNORMAL LOW (ref 4.14–5.80)
RDW: 12 % (ref 11.6–15.4)
WBC: 7.2 10*3/uL (ref 3.4–10.8)

## 2019-07-06 LAB — COMPREHENSIVE METABOLIC PANEL
ALT: 35 IU/L (ref 0–44)
AST: 36 IU/L (ref 0–40)
Albumin/Globulin Ratio: 1.9 (ref 1.2–2.2)
Albumin: 4.5 g/dL (ref 4.0–5.0)
Alkaline Phosphatase: 58 IU/L (ref 39–117)
BUN/Creatinine Ratio: 10 (ref 9–20)
BUN: 7 mg/dL (ref 6–24)
Bilirubin Total: 0.6 mg/dL (ref 0.0–1.2)
CO2: 22 mmol/L (ref 20–29)
Calcium: 9.6 mg/dL (ref 8.7–10.2)
Chloride: 98 mmol/L (ref 96–106)
Creatinine, Ser: 0.71 mg/dL — ABNORMAL LOW (ref 0.76–1.27)
GFR calc Af Amer: 129 mL/min/{1.73_m2} (ref >59)
GFR calc non Af Amer: 112 mL/min/{1.73_m2} (ref >59)
Globulin, Total: 2.4 g/dL (ref 1.5–4.5)
Glucose: 93 mg/dL (ref 65–99)
Potassium: 4.4 mmol/L (ref 3.5–5.2)
Sodium: 136 mmol/L (ref 134–144)
Total Protein: 6.9 g/dL (ref 6.0–8.5)

## 2019-07-06 LAB — LIPID PANEL W/O CHOL/HDL RATIO
Cholesterol, Total: 235 mg/dL — ABNORMAL HIGH (ref 100–199)
HDL: 132 mg/dL (ref >39)
LDL Chol Calc (NIH): 96 mg/dL (ref 0–99)
Triglycerides: 43 mg/dL (ref 0–149)
VLDL Cholesterol Cal: 7 mg/dL (ref 5–40)

## 2019-07-06 LAB — TSH: TSH: 1.35 u[IU]/mL (ref 0.450–4.500)

## 2019-07-18 ENCOUNTER — Other Ambulatory Visit: Payer: Self-pay

## 2019-07-18 ENCOUNTER — Ambulatory Visit: Payer: Self-pay

## 2019-07-18 DIAGNOSIS — Z20822 Contact with and (suspected) exposure to covid-19: Secondary | ICD-10-CM

## 2019-07-18 NOTE — Telephone Encounter (Addendum)
Called and left VM for patient to return call to office for question about exposure to COVID-19.  Second attempt left VM to return call to speak with nurse.

## 2019-07-18 NOTE — Telephone Encounter (Signed)
Pt. States he had been around 3 co-workers that tested positive for COVID 19. Gave information on testing sites. Pt. Does not currently have symptoms. Answer Assessment - Initial Assessment Questions 1. COVID-19 CLOSE CONTACT: "Who is the person with the confirmed or suspected COVID-19 infection that you were exposed to?"     Co-workers 2. PLACE of CONTACT: "Where were you when you were exposed to COVID-19?" (e.g., home, school, medical waiting room; which city?)     Work 3. TYPE of CONTACT: "How much contact was there?" (e.g., sitting next to, live in same house, work in same office, same building)     Work 4. DURATION of CONTACT: "How long were you in contact with the COVID-19 patient?" (e.g., a few seconds, passed by person, a few minutes, 15 minutes or longer, live with the patient)     Hours 5. MASK: "Were you wearing a mask?" "Was the other person wearing a mask?" Note: wearing a mask reduces the risk of an  otherwise close contact.     No 6. DATE of CONTACT: "When did you have contact with a COVID-19 patient?" (e.g., how many days ago)     Last Friday 7. COMMUNITY SPREAD: "Are there lots of cases of COVID-19 (community spread) where you live?" (See public health department website, if unsure)       Yes 8. SYMPTOMS: "Do you have any symptoms?" (e.g., fever, cough, breathing difficulty, loss of taste or smell)     No 9. PREGNANCY OR POSTPARTUM: "Is there any chance you are pregnant?" "When was your last menstrual period?" "Did you deliver in the last 2 weeks?"     n/a 10. HIGH RISK: "Do you have any heart or lung problems? Do you have a weak immune system?" (e.g., heart failure, COPD, asthma, HIV positive, chemotherapy, renal failure, diabetes mellitus, sickle cell anemia, obesity)       No 11.  TRAVEL: "Have you traveled out of the country recently?" If so, "When and where?"  Also ask about out-of-state travel, since the CDC has identified some high-risk cities for community spread in  the [place].  Note: Travel becomes less relevant if there is widespread community transmission where the patient lives.       No  Protocols used: CORONAVIRUS (COVID-19) EXPOSURE-A-AH

## 2019-07-19 LAB — NOVEL CORONAVIRUS, NAA: SARS-CoV-2, NAA: NOT DETECTED

## 2019-11-28 ENCOUNTER — Other Ambulatory Visit: Payer: Self-pay | Admitting: Family Medicine

## 2019-12-06 ENCOUNTER — Telehealth: Payer: Self-pay | Admitting: Family Medicine

## 2019-12-06 MED ORDER — CETIRIZINE HCL 10 MG PO TABS: 10.0000 mg | ORAL_TABLET | Freq: Every day | ORAL | 11 refills | Status: DC

## 2019-12-06 NOTE — Telephone Encounter (Signed)
That is is OTC which is probably why it's not covered.

## 2019-12-06 NOTE — Telephone Encounter (Signed)
Rx printed

## 2019-12-06 NOTE — Telephone Encounter (Signed)
Good RX printed, can you print a hard prescription

## 2019-12-06 NOTE — Telephone Encounter (Signed)
Copied from [place] [id number]. Topic: General - Other >> Dec 06, 2019 11:00 AM Antonieta Iba C wrote: Reason for CRM: pt called in to discuss a change to allergy medication. Pt says that his medication is the generic for Zyrtec Pt says that his insurance will no longer cover. Pt says that medication is to expensive.   Please assist.

## 2019-12-19 ENCOUNTER — Encounter: Payer: Self-pay | Admitting: Nurse Practitioner

## 2019-12-19 ENCOUNTER — Ambulatory Visit: Payer: BC Managed Care – PPO | Admitting: Nurse Practitioner

## 2019-12-19 ENCOUNTER — Other Ambulatory Visit: Payer: Self-pay

## 2019-12-19 VITALS — BP 169/73 | HR 97 | Temp 98.6°F | Ht 69.0 in | Wt 180.0 lb

## 2019-12-19 DIAGNOSIS — K4090 Unilateral inguinal hernia, without obstruction or gangrene, not specified as recurrent: Secondary | ICD-10-CM | POA: Diagnosis not present

## 2019-12-19 IMAGING — MG DIGITAL DIAGNOSTIC BILATERAL MAMMOGRAM WITH TOMO AND CAD
6 of 10 series · 6 of 30 positions shown · non-contrast
Comparison: Previous exam(s).

ACR Breast Density Category a: The breast tissue is almost entirely
fatty.

CLINICAL DATA: 47-year-old male with enlarging left breast palpable
mass for over the course of 1 year.

EXAM:
DIGITAL DIAGNOSTIC BILATERAL MAMMOGRAM WITH CAD AND TOMO
ULTRASOUND LEFT BREAST

[L CC synth-2D (1 of 2)]
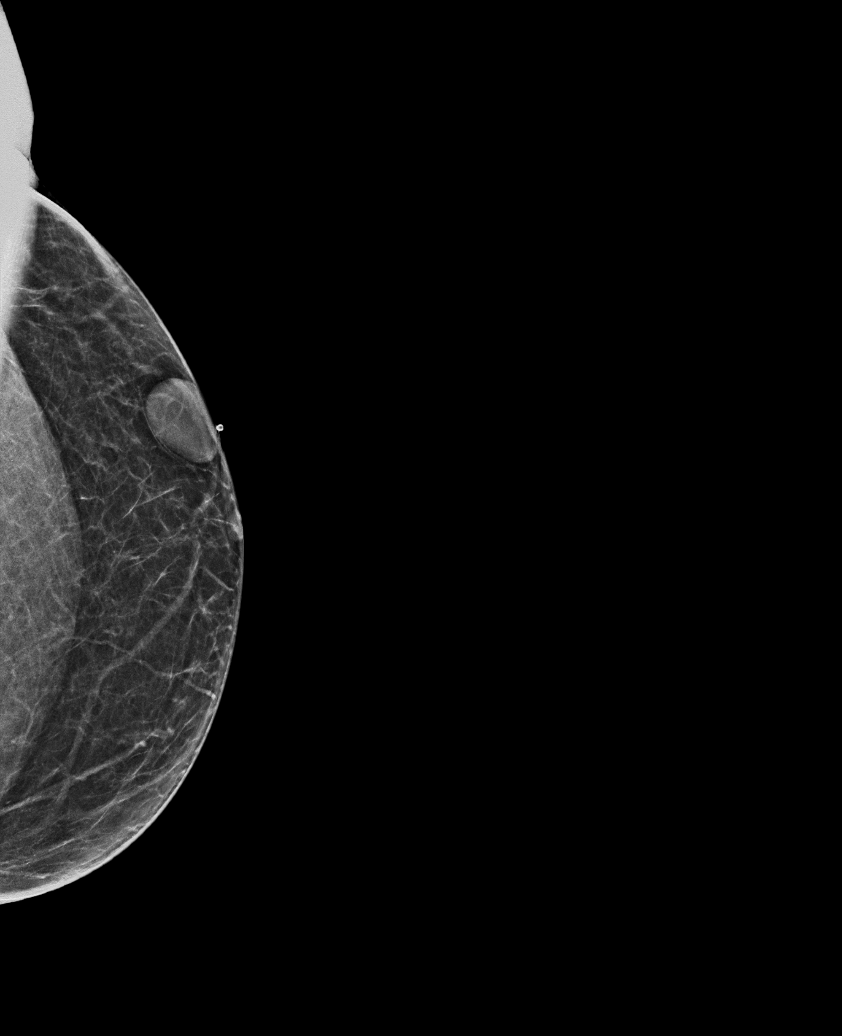

[L MLO synth-2D]
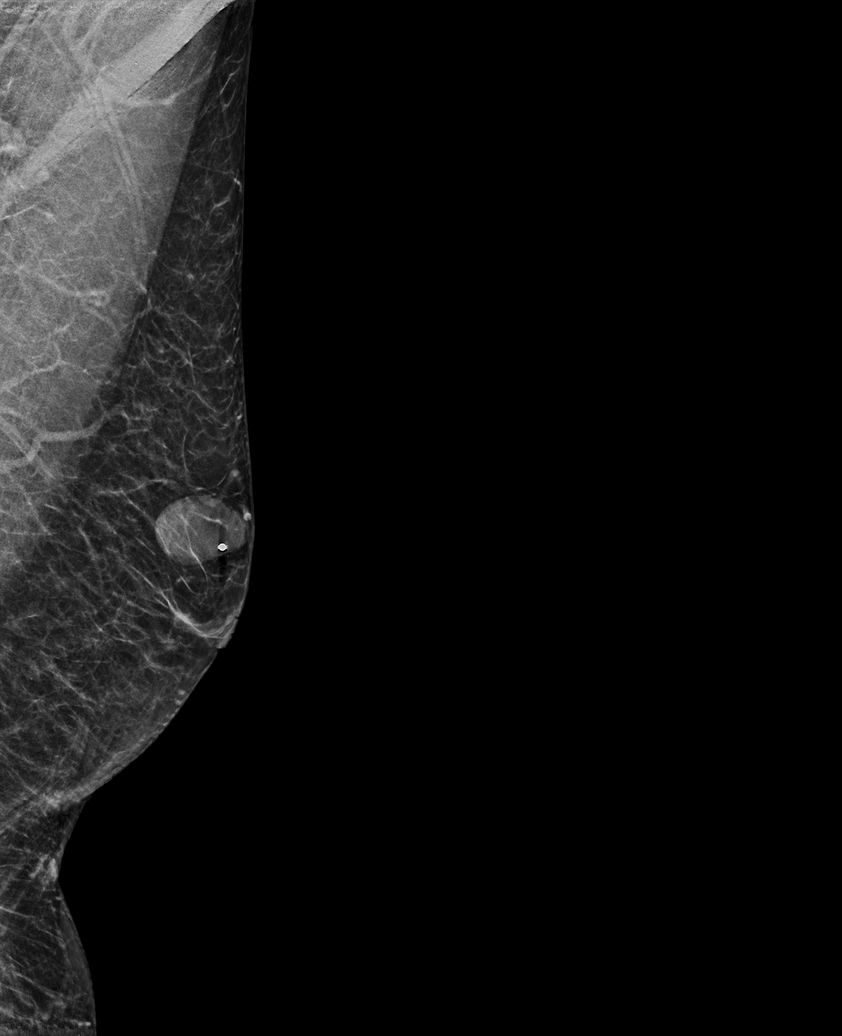

[R MLO synth-2D]
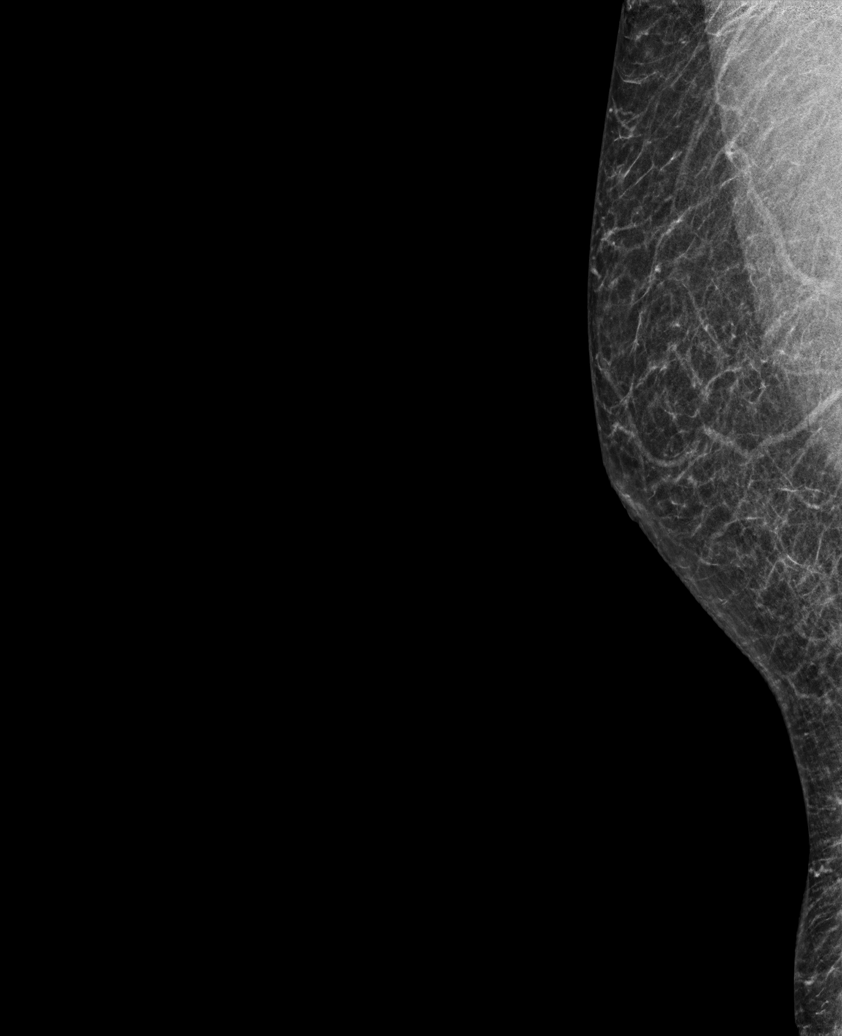

[L CC synth-2D (2 of 2)]
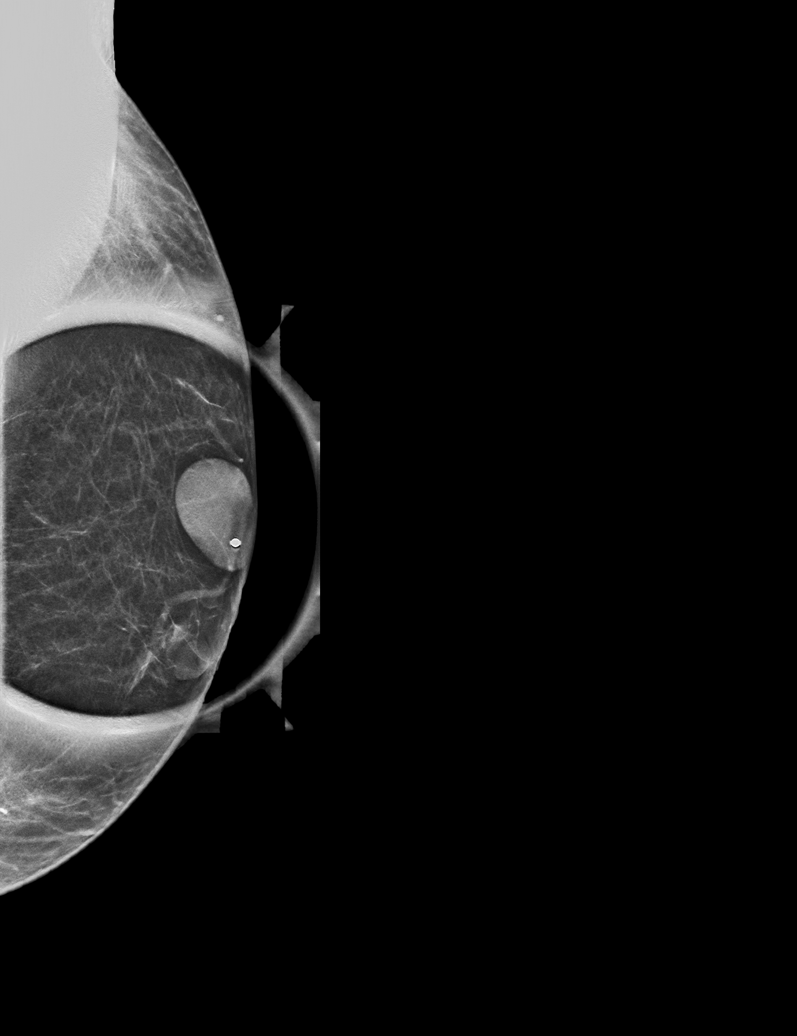

[R CC synth-2D]
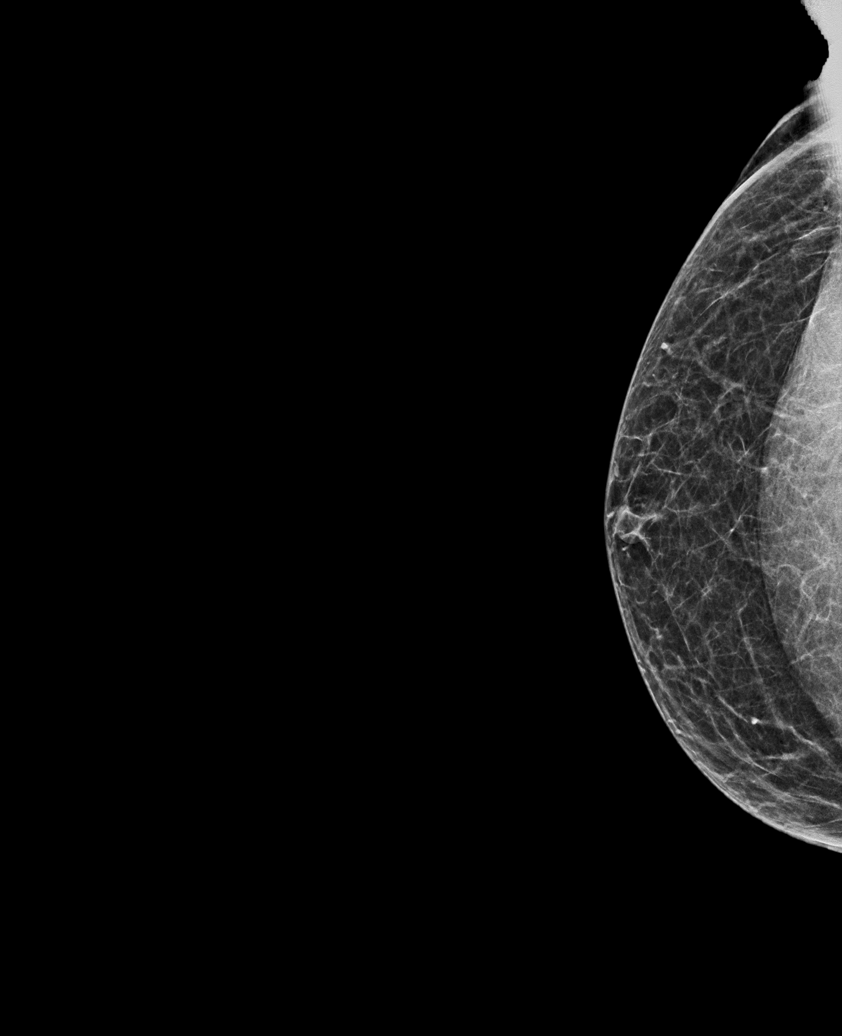

[L CC tomo · tomo slice 20/39.0]
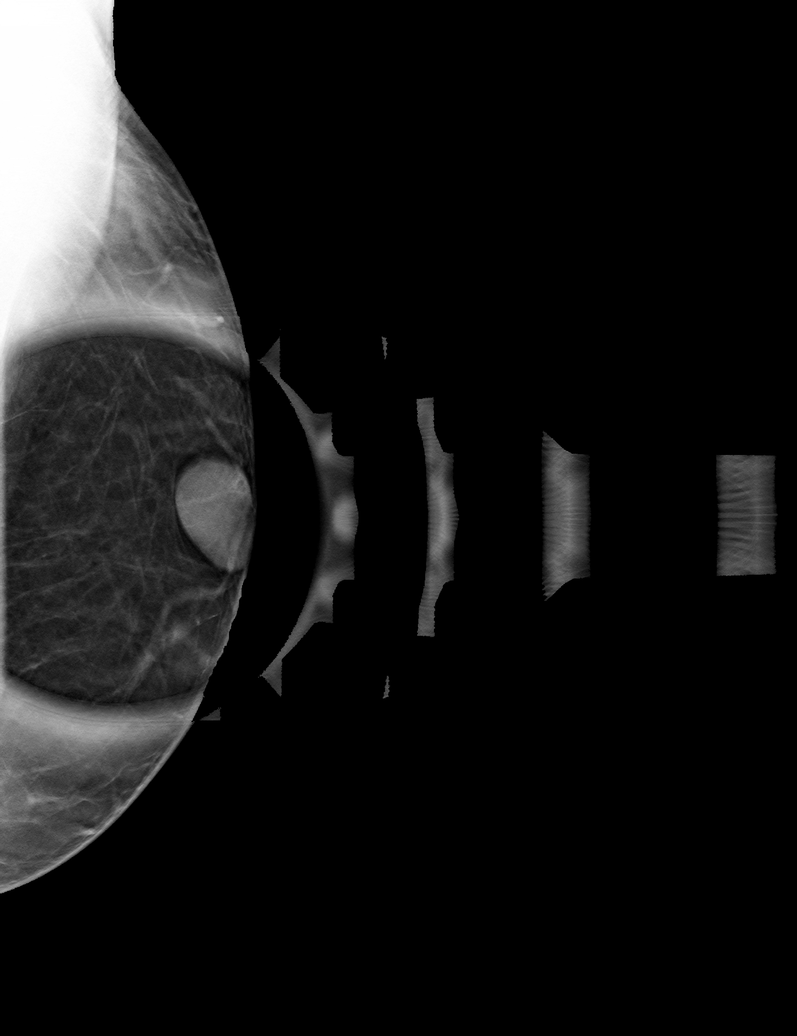

[6 of 30 positions shown; findings below may reference images not displayed]

FINDINGS: Radiopaque BB was placed at the site of the patient's palpable
abnormalities in the upper-outer quadrant of the left breast. An
oval, circumscribed hyperdense mass is seen deep to the radiopaque
BB. This is not in the region of the patient's nipple. No additional
suspicious findings are identified in either breast.

Mammographic images were processed with CAD.

On physical exam, I palpate a 2-3 cm circumscribed, firm mobile mass
in the upper outer quadrant of the left breast.

Targeted ultrasound is performed, showing an oval, circumscribed
hypoechoic mass with a few central cystic foci at the 1 o'clock
position 1 cm from the nipple. It measures 2.6 x 1.9 x 1.2 cm. Note
is made of peripheral and some internal vascularity.

Evaluation of the left axilla demonstrates no suspicious
lymphadenopathy.
IMPRESSION: 1. Indeterminate left breast mass corresponding with the patient's
palpable abnormality. Recommendation is for ultrasound-guided
biopsy.
2. No suspicious left axillary lymphadenopathy.
3. No suspicious mammographic findings on the right.

RECOMMENDATION:
Ultrasound-guided biopsy of the palpable left breast mass.

I have discussed the findings and recommendations with the patient.
Results were also provided in writing at the conclusion of the
visit. If applicable, a reminder letter will be sent to the patient
regarding the next appointment.

BI-RADS CATEGORY  4: Suspicious.

## 2019-12-19 NOTE — Progress Notes (Signed)
BP (!) 169/73 (BP Location: Left Arm, Patient Position: Sitting, Cuff Size: Normal)   Pulse 97   Temp 98.6 F (37 C) (Oral)   Ht 5\' 9"  (1.753 m)   Wt 180 lb (81.6 kg)   SpO2 97%   BMI 26.58 kg/m    Subjective:    Patient ID: Edward Mckenzie, male    DOB: 06/15/1971, 49 y.o.   MRN: [id number]  HPI: Edward Mckenzie is a 49 y.o. male presenting for abdominal pain.  Chief Complaint  Patient presents with  . Abdominal Pain    Patient states appx 1 month ago, his dog jumped on him (for a treat) and now patient has a mass and fullness feeling in abdomen. Concerned for hernia.    HERNIA Duration: months Location: right mid-abdomen Painful: yes; has gotten worse In first couple of days, was more sore; not it is more painful and feels full with fluid.  Discomfort: yes; report he has been sitting more noticaly in the past couple of weeks Bulge: yes Quality:  pressure Onset: constant Severity: moderate Context: not changing and worse Aggravating factors: valsalva and coughing  Treatments attempted: not tried  No Known Allergies  Outpatient Encounter Medications as of 12/19/2019  Medication Sig  . Ascorbic Acid (VITAMIN C) 100 MG tablet Take 100 mg by mouth daily.  . cetirizine (ZYRTEC) 10 MG tablet Take 1 tablet (10 mg total) by mouth daily.  . fluticasone (FLONASE) 50 MCG/ACT nasal spray INSTILL TWO SPRAYS IN EACH NOSTRIL ONCE A DAY  . gabapentin (NEURONTIN) 300 MG capsule Take 1 capsule (300 mg total) by mouth 3 (three) times daily.  Marland Kitchen lisinopril (ZESTRIL) 5 MG tablet Take 1 tablet (5 mg total) by mouth daily.  . metroNIDAZOLE (METROGEL) 1 % gel Apply topically daily.  . montelukast (SINGULAIR) 10 MG tablet Take 1 tablet (10 mg total) by mouth at bedtime.  . Multiple Vitamin (ONE-A-DAY MENS PO) Take by mouth.  . Multiple Vitamins-Minerals (ZINC PO) Take 1 Dose by mouth daily.  . Omega-3 Fatty Acids (FISH OIL) 1000 MG CAPS Take 1,000 mg by mouth daily.  Marland Kitchen  thiamine (VITAMIN B-1) 100 MG tablet Take 100 mg by mouth daily.  . traZODone (DESYREL) 100 MG tablet Take 0.5-1 tablets (50-100 mg total) by mouth at bedtime as needed for sleep.  Marland Kitchen triamcinolone ointment (KENALOG) 0.5 % Apply 1 application topically 2 (two) times daily.  Marland Kitchen VITAMIN D PO Take 1 Dose by mouth daily.  Marland Kitchen azelastine (ASTELIN) 0.1 % nasal spray   . [DISCONTINUED] neomycin-polymyxin b-dexamethasone (MAXITROL) 3.5-10000-0.1 SUSP PLACE 1 DROP IN BOTH EYES 4 TIMES A DAY FOR 2 WEEKS  . [DISCONTINUED] tobramycin-dexamethasone (TOBRADEX) ophthalmic solution PUT 1 DROP INTO INTO RIGHT EYE 4 TIMES A DAY FOR 2 WEEKS   No facility-administered encounter medications on file as of 12/19/2019.   Patient Active Problem List   Diagnosis Date Noted  . Non-recurrent unilateral inguinal hernia without obstruction or gangrene 12/19/2019  . Rosacea 05/21/2017  . HTN (hypertension) 03/20/2016  . Insomnia 01/24/2016  . Allergic rhinitis 04/02/2015  . Abnormal electrocardiogram 04/02/2015   Past Medical History:  Diagnosis Date  . Carpal tunnel syndrome of right wrist   . Jones fracture    left foot  . Wears contact lenses    Relevant past medical, surgical, family and social history reviewed and updated as indicated. Interim medical history since our last visit reviewed.  Review of Systems  Constitutional: Negative.  Negative for activity change, appetite  change and fever.  Gastrointestinal: Positive for abdominal pain (right groin area). Negative for blood in stool, constipation, diarrhea and vomiting.  Genitourinary: Negative.   Skin: Negative.  Negative for color change, pallor and wound.  Neurological: Negative.   Hematological: Negative.  Negative for adenopathy.  Psychiatric/Behavioral: Negative.    Per HPI unless specifically indicated above     Objective:    BP (!) 169/73 (BP Location: Left Arm, Patient Position: Sitting, Cuff Size: Normal)   Pulse 97   Temp 98.6 F (37 C)  (Oral)   Ht 5\' 9"  (1.753 m)   Wt 180 lb (81.6 kg)   SpO2 97%   BMI 26.58 kg/m   Wt Readings from Last 3 Encounters:  12/19/19 180 lb (81.6 kg)  07/05/19 180 lb 6 oz (81.8 kg)  11/24/18 180 lb (81.6 kg)    Physical Exam Vitals and nursing note reviewed.  Constitutional:      General: He is not in acute distress.    Appearance: He is well-developed. He is not toxic-appearing.  Abdominal:     General: Abdomen is flat. Bowel sounds are normal. There is no distension.     Palpations: Abdomen is soft. There is no fluid wave or hepatomegaly.     Tenderness: There is abdominal tenderness in the suprapubic area.     Hernia: A hernia is present. Hernia is present in the right inguinal area.  Skin:    General: Skin is warm and dry.     Capillary Refill: Capillary refill takes less than 2 seconds.     Coloration: Skin is not cyanotic or jaundiced.  Neurological:     General: No focal deficit present.     Mental Status: He is alert and oriented to person, place, and time.     Motor: No weakness.  Psychiatric:        Mood and Affect: Mood normal. Mood is not anxious.        Behavior: Behavior normal.       Assessment & Plan:   Problem List Items Addressed This Visit      Other   Non-recurrent unilateral inguinal hernia without obstruction or gangrene - Primary    Acute, ongoing.  With worsening pain, will send referral to general surgery for evaluation and possible intervention.  Advised with any sudden onset increased pain, fever, nausea/vomiting, or blood in bowel movements, go to ED.  Return or call clinic with any questions.      Relevant Orders   Ambulatory referral to General Surgery       Follow up plan: Return as scheduled.

## 2019-12-19 NOTE — Patient Instructions (Signed)
° °Inguinal Hernia, Adult °An inguinal hernia is when fat or your intestines push through a weak spot in a muscle where your leg meets your lower belly (groin). This causes a rounded lump (bulge). This kind of hernia could also be: °· In your scrotum, if you are male. °· In folds of skin around your vagina, if you are male. °There are three types of inguinal hernias. These include: °· Hernias that can be pushed back into the belly (are reducible). This type rarely causes pain. °· Hernias that cannot be pushed back into the belly (are incarcerated). °· Hernias that cannot be pushed back into the belly and lose their blood supply (are strangulated). This type needs emergency surgery. °If you do not have symptoms, you may not need treatment. If you have symptoms or a large hernia, you may need surgery. °Follow these instructions at home: °Lifestyle °· Do these things if told by your doctor so you do not have trouble pooping (constipation): °? Drink enough fluid to keep your pee (urine) pale yellow. °? Eat foods that have a lot of fiber. These include fresh fruits and vegetables, whole grains, and beans. °? Limit foods that are high in fat and processed sugars. These include foods that are fried or sweet. °? Take medicine for trouble pooping. °· Avoid lifting heavy objects. °· Avoid standing for long amounts of time. °· Do not use any products that contain nicotine or tobacco. These include cigarettes and e-cigarettes. If you need help quitting, ask your doctor. °· Stay at a healthy weight. °General instructions °· You may try to push your hernia in by very gently pressing on it when you are lying down. Do not try to force the bulge back in if it will not push in easily. °· Watch your hernia for any changes in shape, size, or color. Tell your doctor if you see any changes. °· Take over-the-counter and prescription medicines only as told by your doctor. °· Keep all follow-up visits as told by your doctor. This is  important. °Contact a doctor if: °· You have a fever. °· You have new symptoms. °· Your symptoms get worse. °Get help right away if: °· The area where your leg meets your lower belly has: °? Pain that gets worse suddenly. °? A bulge that gets bigger suddenly, and it does not get smaller after that. °? A bulge that turns red or purple. °? A bulge that is painful when you touch it. °· You are a man, and your scrotum: °? Suddenly feels painful. °? Suddenly changes in size. °· You cannot push the hernia in by very gently pressing on it when you are lying down. Do not try to force the bulge back in if it will not push in easily. °· You feel sick to your stomach (nauseous), and that feeling does not go away. °· You throw up (vomit), and that keeps happening. °· You have a fast heartbeat. °· You cannot poop (have a bowel movement) or pass gas. °These symptoms may be an emergency. Do not wait to see if the symptoms will go away. Get medical help right away. Call your local emergency services (911 in the [place] °Summary °· An inguinal hernia is when fat or your intestines push through a weak spot in a muscle where your leg meets your lower belly (groin). This causes a rounded lump (bulge). °· If you do not have symptoms, you may not need treatment. If you have symptoms or a large hernia,   you may need surgery. °· Avoid lifting heavy objects. Also avoid standing for long amounts of time. °· Do not try to force the bulge back in if it will not push in easily. °This information is not intended to replace advice given to you by your health care provider. Make sure you discuss any questions you have with your health care provider. °Document Revised: 08/29/2017 Document Reviewed: 04/29/2017 °Elsevier Patient Education © 2020 Elsevier Inc. ° °

## 2019-12-19 NOTE — Assessment & Plan Note (Signed)
Acute, ongoing.  With worsening pain, will send referral to general surgery for evaluation and possible intervention.  Advised with any sudden onset increased pain, fever, nausea/vomiting, or blood in bowel movements, go to ED.  Return or call clinic with any questions.

## 2019-12-26 ENCOUNTER — Other Ambulatory Visit: Payer: Self-pay

## 2019-12-26 ENCOUNTER — Encounter: Payer: Self-pay | Admitting: Surgery

## 2019-12-26 ENCOUNTER — Ambulatory Visit: Payer: BC Managed Care – PPO | Admitting: Surgery

## 2019-12-26 VITALS — BP 153/95 | HR 96 | Temp 97.9°F | Resp 14 | Ht 69.0 in | Wt 182.0 lb

## 2019-12-26 DIAGNOSIS — K4020 Bilateral inguinal hernia, without obstruction or gangrene, not specified as recurrent: Secondary | ICD-10-CM

## 2019-12-26 NOTE — Patient Instructions (Signed)
Our surgery scheduler will contact you to schedule your surgery. Please have the Blue Sheet available when she calls you.   Call the office if you have any questions or concerns.  Inguinal Hernia, Adult An inguinal hernia develops when fat or the intestines push through a weak spot in a muscle where your leg meets your lower abdomen (groin). This creates a bulge. This kind of hernia could also be:  In your scrotum, if you are male.  In folds of skin around your vagina, if you are male. There are three types of inguinal hernias:  Hernias that can be pushed back into the abdomen (are reducible). This type rarely causes pain.  Hernias that are not reducible (are incarcerated).  Hernias that are not reducible and lose their blood supply (are strangulated). This type of hernia requires emergency surgery. What are the causes? This condition is caused by having a weak spot in the muscles or tissues in the groin. This weak spot develops over time. The hernia may poke through the weak spot when you suddenly strain your lower abdominal muscles, such as when you:  Lift a heavy object.  Strain to have a bowel movement. Constipation can lead to straining.  Cough. What increases the risk? This condition is more likely to develop in:  Men.  Pregnant women.  People who: ? Are overweight. ? Work in jobs that require long periods of standing or heavy lifting. ? Have had an inguinal hernia before. ? Smoke or have lung disease. These factors can lead to long-lasting (chronic) coughing. What are the signs or symptoms? Symptoms may depend on the size of the hernia. Often, a small inguinal hernia has no symptoms. Symptoms of a larger hernia may include:  A lump in the groin area. This is easier to see when standing. It might not be visible when lying down.  Pain or burning in the groin. This may get worse when lifting, straining, or coughing.  A dull ache or a feeling of pressure in the  groin.  In men, an unusual lump in the scrotum. Symptoms of a strangulated inguinal hernia may include:  A bulge in your groin that is very painful and tender to the touch.  A bulge that turns red or purple.  Fever, nausea, and vomiting.  Inability to have a bowel movement or to pass gas. How is this diagnosed? This condition is diagnosed based on your symptoms, your medical history, and a physical exam. Your health care provider may feel your groin area and ask you to cough. How is this treated? Treatment depends on the size of your hernia and whether you have symptoms. If you do not have symptoms, your health care provider may have you watch your hernia carefully and have you come in for follow-up visits. If your hernia is large or if you have symptoms, you may need surgery to repair the hernia. Follow these instructions at home: Lifestyle  Avoid lifting heavy objects.  Avoid standing for long periods of time.  Do not use any products that contain nicotine or tobacco, such as cigarettes and e-cigarettes. If you need help quitting, ask your health care provider.  Maintain a healthy weight. Preventing constipation  Take actions to prevent constipation. Constipation leads to straining with bowel movements, which can make a hernia worse or cause a hernia repair to break down. Your health care provider may recommend that you: ? Drink enough fluid to keep your urine pale yellow. ? Eat foods that are high   in fiber, such as fresh fruits and vegetables, whole grains, and beans. ? Limit foods that are high in fat and processed sugars, such as fried or sweet foods. ? Take an over-the-counter or prescription medicine for constipation. General instructions  You may try to push the hernia back in place by very gently pressing on it while lying down. Do not try to force the bulge back in if it will not push in easily.  Watch your hernia for any changes in shape, size, or color. Get help right  away if you notice any changes.  Take over-the-counter and prescription medicines only as told by your health care provider.  Keep all follow-up visits as told by your health care provider. This is important. Contact a health care provider if:  You have a fever.  You develop new symptoms.  Your symptoms get worse. Get help right away if:  You have pain in your groin that suddenly gets worse.  You have a bulge in your groin that: ? Suddenly gets bigger and does not get smaller. ? Becomes red or purple or painful to the touch.  You are a man and you have a sudden pain in your scrotum, or the size of your scrotum suddenly changes.  You cannot push the hernia back in place by very gently pressing on it when you are lying down. Do not try to force the bulge back in if it will not push in easily.  You have nausea or vomiting that does not go away.  You have a fast heartbeat.  You cannot have a bowel movement or pass gas. These symptoms may represent a serious problem that is an emergency. Do not wait to see if the symptoms will go away. Get medical help right away. Call your local emergency services (911 in the [place] Summary  An inguinal hernia develops when fat or the intestines push through a weak spot in a muscle where your leg meets your lower abdomen (groin).  This condition is caused by having a weak spot in muscles or tissue in your groin.  Symptoms may depend on the size of the hernia, and they may include pain or swelling in your groin. A small inguinal hernia often has no symptoms.  Treatment may not be needed if you do not have symptoms. If you have symptoms or a large hernia, you may need surgery to repair the hernia.  Avoid lifting heavy objects. Also avoid standing for long amounts of time. This information is not intended to replace advice given to you by your health care provider. Make sure you discuss any questions you have with your health care  provider. Document Revised: 08/29/2017 Document Reviewed: 04/29/2017 Elsevier Patient Education  2020 Elsevier Inc.  

## 2019-12-27 NOTE — Progress Notes (Signed)
Patient ID: Edward Mckenzie, male   DOB: [birth year], 49 y.o.   MRN: [id number]  HPI Edward Mckenzie is a 49 y.o. male seen in consultation at the request of Mrs. Asaro NP for a symptomatic right inguinal hernia. He reports that over the last few weeks he has been having intermittent right inguinal pain that is sharp moderate intensity and worsening with Valsalva. No fevers no chills no evidence of bowel obstruction. He denies any previous abdominal operations. He also feels a bulge within the right inguinal area. No previous inguinal hernia repairs in the past. He doesn't smoke back of cigarettes a day but is able to perform more than six METS of activity without any shortness of breath or chest pain.  HPI  Past Medical History:  Diagnosis Date  . Carpal tunnel syndrome of right wrist   . Jones fracture    left foot  . Wears contact lenses     Past Surgical History:  Procedure Laterality Date  . BACK SURGERY    . BREAST BIOPSY Left 06/22/2018   [place] bx BENIGN FIBROFATTY TISSUE WITH DEGENERATIVE CHANGES  . KNEE SURGERY    . NASAL SINUS SURGERY  02/22/98   UPPP, tonguepexy, septo.  Dr. Kathyrn Sheriff. [place]  . ORIF TOE FRACTURE Left 12/20/2015   Procedure: OPEN REDUCTION INTERNAL FIXATION (ORIF) 5TH METATARSAL (TOE) FRACTURE LEFT;  Surgeon: Albertine Patricia, DPM;  Location: [facility];  Service: Podiatry;  Laterality: Left;  LMA WITH POPLITEAL  . TONSILLECTOMY AND ADENOIDECTOMY      Family History  Problem Relation Age of Onset  . Heart disease Father   . Diabetes Paternal Grandfather   . Heart disease Paternal Grandfather   . Cancer Paternal Grandfather        unknown location  . Breast cancer Neg Hx   . Colon cancer Neg Hx     Social History Social History   Tobacco Use  . Smoking status: Current Every Day Smoker    Packs/day: 1.00    Years: 15.00    Pack years: 15.00    Types: Cigarettes  . Smokeless tobacco: Former Network engineer Use Topics  . Alcohol use:  Yes    Alcohol/week: 0.0 standard drinks    Comment: pt states "recently quit".  . Drug use: No    No Known Allergies  Current Outpatient Medications  Medication Sig Dispense Refill  . Ascorbic Acid (VITAMIN C) 100 MG tablet Take 100 mg by mouth daily.    Marland Kitchen azelastine (ASTELIN) 0.1 % nasal spray     . cetirizine (ZYRTEC) 10 MG tablet Take 1 tablet (10 mg total) by mouth daily. 30 tablet 11  . fluticasone (FLONASE) 50 MCG/ACT nasal spray INSTILL TWO SPRAYS IN EACH NOSTRIL ONCE A DAY 16 g 12  . gabapentin (NEURONTIN) 300 MG capsule Take 1 capsule (300 mg total) by mouth 3 (three) times daily. 270 capsule 1  . lisinopril (ZESTRIL) 5 MG tablet Take 1 tablet (5 mg total) by mouth daily. 90 tablet 1  . metroNIDAZOLE (METROGEL) 1 % gel Apply topically daily. 60 g 12  . montelukast (SINGULAIR) 10 MG tablet Take 1 tablet (10 mg total) by mouth at bedtime. 90 tablet 3  . Multiple Vitamin (ONE-A-DAY MENS PO) Take by mouth.    . Multiple Vitamins-Minerals (ZINC PO) Take 1 Dose by mouth daily.    . Omega-3 Fatty Acids (FISH OIL) 1000 MG CAPS Take 1,000 mg by mouth daily.    Marland Kitchen thiamine (VITAMIN B-1)  100 MG tablet Take 100 mg by mouth daily.    . traZODone (DESYREL) 100 MG tablet Take 0.5-1 tablets (50-100 mg total) by mouth at bedtime as needed for sleep. 90 tablet 1  . triamcinolone ointment (KENALOG) 0.5 % Apply 1 application topically 2 (two) times daily. 30 g 3  . VITAMIN D PO Take 1 Dose by mouth daily.     No current facility-administered medications for this visit.     Review of Systems Full ROS  was asked and was negative except for the information on the HPI  Physical Exam Blood pressure (!) 153/95, pulse 96, temperature 97.9 F (36.6 C), resp. rate 14, height 5\' 9"  (1.753 m), weight 182 lb (82.6 kg), SpO2 92 %. CONSTITUTIONAL: NAD EYES: Pupils are equal, round, and reactive to light, Sclera are non-icteric. EARS, NOSE, MOUTH AND THROAT: The oropharynx is clear. The oral mucosa is  pink and moist. Hearing is intact to voice. LYMPH NODES:  Lymph nodes in the neck are normal. RESPIRATORY:  Lungs are clear. There is normal respiratory effort, with equal breath sounds bilaterally, and without pathologic use of accessory muscles. CARDIOVASCULAR: Heart is regular without murmurs, gallops, or rubs. GI: The abdomen is soft, nontender, and nondistended. There are no palpable masses. There is no hepatosplenomegaly. There are normal bowel sounds in all quadrants. Bilateral inguinal hernias, right larger than left GU: Rectal deferred.   MUSCULOSKELETAL: Normal muscle strength and tone. No cyanosis or edema.   SKIN: Turgor is good and there are no pathologic skin lesions or ulcers. NEUROLOGIC: Motor and sensation is grossly normal. Cranial nerves are grossly intact. PSYCH:  Oriented to person, place and time. Affect is normal.  Data Reviewed I have personally reviewed the patient's imaging, laboratory findings and medical records.    Assessment/Plan 49 year old male with a symptomatic right inguinal hernia and asymptomatic left inguinal hernia. Discussed with the patient in detail about my advise for surgical intervention. I do think that he will benefit from robotic approach and bilateral inguinal hernia repair. Proceed discussed with patient detail. Risks, benefits and possible complications including but not limited to: Bleeding, infection, bowel injury, chronic pain, recurrence and anesthesia complications. He understands and wishes to proceed. He also understands that the fact that he is smokes present mildly high risk for perioperative complications Copy of this report was sent to the referring provider  Caroleen Hamman, MD FACS General Surgeon 12/27/2019, 10:30 AM

## 2019-12-27 NOTE — H&P (View-Only) (Signed)
Patient ID: Edward Mckenzie, male   DOB: 07-24-1971, 49 y.o.   MRN: [id number]  HPI Edward Mckenzie is a 49 y.o. male seen in consultation at the request of Mrs. Asaro NP for a symptomatic right inguinal hernia. He reports that over the last few weeks he has been having intermittent right inguinal pain that is sharp moderate intensity and worsening with Valsalva. No fevers no chills no evidence of bowel obstruction. He denies any previous abdominal operations. He also feels a bulge within the right inguinal area. No previous inguinal hernia repairs in the past. He doesn't smoke back of cigarettes a day but is able to perform more than six METS of activity without any shortness of breath or chest pain.  HPI  Past Medical History:  Diagnosis Date  . Carpal tunnel syndrome of right wrist   . Jones fracture    left foot  . Wears contact lenses     Past Surgical History:  Procedure Laterality Date  . BACK SURGERY    . BREAST BIOPSY Left 06/22/2018   [place] bx BENIGN FIBROFATTY TISSUE WITH DEGENERATIVE CHANGES  . KNEE SURGERY    . NASAL SINUS SURGERY  02/22/98   UPPP, tonguepexy, septo.  Dr. Kathyrn Sheriff. [place]  . ORIF TOE FRACTURE Left 12/20/2015   Procedure: OPEN REDUCTION INTERNAL FIXATION (ORIF) 5TH METATARSAL (TOE) FRACTURE LEFT;  Surgeon: Albertine Patricia, DPM;  Location: [facility];  Service: Podiatry;  Laterality: Left;  LMA WITH POPLITEAL  . TONSILLECTOMY AND ADENOIDECTOMY      Family History  Problem Relation Age of Onset  . Heart disease Father   . Diabetes Paternal Grandfather   . Heart disease Paternal Grandfather   . Cancer Paternal Grandfather        unknown location  . Breast cancer Neg Hx   . Colon cancer Neg Hx     Social History Social History   Tobacco Use  . Smoking status: Current Every Day Smoker    Packs/day: 1.00    Years: 15.00    Pack years: 15.00    Types: Cigarettes  . Smokeless tobacco: Former Network engineer Use Topics  . Alcohol use:  Yes    Alcohol/week: 0.0 standard drinks    Comment: pt states "recently quit".  . Drug use: No    No Known Allergies  Current Outpatient Medications  Medication Sig Dispense Refill  . Ascorbic Acid (VITAMIN C) 100 MG tablet Take 100 mg by mouth daily.    Marland Kitchen azelastine (ASTELIN) 0.1 % nasal spray     . cetirizine (ZYRTEC) 10 MG tablet Take 1 tablet (10 mg total) by mouth daily. 30 tablet 11  . fluticasone (FLONASE) 50 MCG/ACT nasal spray INSTILL TWO SPRAYS IN EACH NOSTRIL ONCE A DAY 16 g 12  . gabapentin (NEURONTIN) 300 MG capsule Take 1 capsule (300 mg total) by mouth 3 (three) times daily. 270 capsule 1  . lisinopril (ZESTRIL) 5 MG tablet Take 1 tablet (5 mg total) by mouth daily. 90 tablet 1  . metroNIDAZOLE (METROGEL) 1 % gel Apply topically daily. 60 g 12  . montelukast (SINGULAIR) 10 MG tablet Take 1 tablet (10 mg total) by mouth at bedtime. 90 tablet 3  . Multiple Vitamin (ONE-A-DAY MENS PO) Take by mouth.    . Multiple Vitamins-Minerals (ZINC PO) Take 1 Dose by mouth daily.    . Omega-3 Fatty Acids (FISH OIL) 1000 MG CAPS Take 1,000 mg by mouth daily.    Marland Kitchen thiamine (VITAMIN B-1)  100 MG tablet Take 100 mg by mouth daily.    . traZODone (DESYREL) 100 MG tablet Take 0.5-1 tablets (50-100 mg total) by mouth at bedtime as needed for sleep. 90 tablet 1  . triamcinolone ointment (KENALOG) 0.5 % Apply 1 application topically 2 (two) times daily. 30 g 3  . VITAMIN D PO Take 1 Dose by mouth daily.     No current facility-administered medications for this visit.     Review of Systems Full ROS  was asked and was negative except for the information on the HPI  Physical Exam Blood pressure (!) 153/95, pulse 96, temperature 97.9 F (36.6 C), resp. rate 14, height 5\' 9"  (1.753 m), weight 182 lb (82.6 kg), SpO2 92 %. CONSTITUTIONAL: NAD EYES: Pupils are equal, round, and reactive to light, Sclera are non-icteric. EARS, NOSE, MOUTH AND THROAT: The oropharynx is clear. The oral mucosa is  pink and moist. Hearing is intact to voice. LYMPH NODES:  Lymph nodes in the neck are normal. RESPIRATORY:  Lungs are clear. There is normal respiratory effort, with equal breath sounds bilaterally, and without pathologic use of accessory muscles. CARDIOVASCULAR: Heart is regular without murmurs, gallops, or rubs. GI: The abdomen is soft, nontender, and nondistended. There are no palpable masses. There is no hepatosplenomegaly. There are normal bowel sounds in all quadrants. Bilateral inguinal hernias, right larger than left GU: Rectal deferred.   MUSCULOSKELETAL: Normal muscle strength and tone. No cyanosis or edema.   SKIN: Turgor is good and there are no pathologic skin lesions or ulcers. NEUROLOGIC: Motor and sensation is grossly normal. Cranial nerves are grossly intact. PSYCH:  Oriented to person, place and time. Affect is normal.  Data Reviewed I have personally reviewed the patient's imaging, laboratory findings and medical records.    Assessment/Plan 49 year old male with a symptomatic right inguinal hernia and asymptomatic left inguinal hernia. Discussed with the patient in detail about my advise for surgical intervention. I do think that he will benefit from robotic approach and bilateral inguinal hernia repair. Proceed discussed with patient detail. Risks, benefits and possible complications including but not limited to: Bleeding, infection, bowel injury, chronic pain, recurrence and anesthesia complications. He understands and wishes to proceed. He also understands that the fact that he is smokes present mildly high risk for perioperative complications Copy of this report was sent to the referring provider  Caroleen Hamman, MD FACS General Surgeon 12/27/2019, 10:30 AM

## 2019-12-28 ENCOUNTER — Telehealth: Payer: Self-pay | Admitting: Surgery

## 2019-12-28 NOTE — Telephone Encounter (Signed)
Patient calls back, he is now aware of all dates regarding his surgery and voices understanding.

## 2019-12-28 NOTE — Telephone Encounter (Signed)
Outgoing call made, left message for patient to call.  Please advise patient of Pre-Admission date/time, COVID Testing date and Surgery date.  Surgery Date: 01/06/20 Preadmission Testing Date: 12/30/19 (phone 8a-1p) Covid Testing Date: 01/04/20 - patient advised to go to the [facility] ([place]) between 8a-1p   Also patient needs to call [phone], between 1-3:00pm the day before surgery, to find out what time to arrive for surgery.

## 2019-12-29 ENCOUNTER — Other Ambulatory Visit: Payer: Self-pay | Admitting: Emergency Medicine

## 2019-12-29 ENCOUNTER — Telehealth: Payer: Self-pay | Admitting: Surgery

## 2019-12-29 MED ORDER — LIDOCAINE 5 % EX PTCH: 1.0000 | MEDICATED_PATCH | Freq: Two times a day (BID) | CUTANEOUS | 0 refills | Status: DC

## 2019-12-29 NOTE — Telephone Encounter (Signed)
Patient called and is asking if he could get an rx for his pain patient does not want a narcotic patient said he is using [facility] on [address] please call patient and advise.

## 2019-12-29 NOTE — Telephone Encounter (Signed)
Called pt. Per Dr Dahlia Byes lidocaine patch sent to pharmacy, advised to increase gabapentin to 600mg  tid if needed. Advised to ice the area.   Pt voiced understanding.

## 2019-12-30 ENCOUNTER — Encounter
Admission: RE | Admit: 2019-12-30 | Discharge: 2019-12-30 | Disposition: A | Discharge status: Home / Self Care | Payer: BC Managed Care – PPO | Source: Ambulatory Visit | Attending: Surgery | Admitting: Surgery

## 2019-12-30 ENCOUNTER — Other Ambulatory Visit: Payer: Self-pay

## 2019-12-30 HISTORY — DX: Essential (primary) hypertension: I10

## 2019-12-30 NOTE — Patient Instructions (Signed)
Your procedure is scheduled on: 01/06/20 Report to [place]. To find out your arrival time please call [phone] between 1PM - 3PM on 01/05/20.  Remember: Instructions that are not followed completely may result in serious medical risk, up to and including death, or upon the discretion of your surgeon and anesthesiologist your surgery may need to be rescheduled.     _X__ 1. Do not eat food after midnight the night before your procedure.                 No gum chewing or hard candies. You may drink clear liquids up to 2 hours                 before you are scheduled to arrive for your surgery- DO not drink clear                 liquids within 2 hours of the start of your surgery.                 Clear Liquids include:  water, apple juice without pulp, clear carbohydrate                 drink such as Clearfast or Gatorade, Black Coffee or Tea (Do not add                 anything to coffee or tea). Diabetics water only  __X__2.  On the morning of surgery brush your teeth with toothpaste and water, you                 may rinse your mouth with mouthwash if you wish.  Do not swallow any              toothpaste of mouthwash.     _X__ 3.  No Alcohol for 24 hours before or after surgery.   _X__ 4.  Do Not Smoke or use e-cigarettes For 24 Hours Prior to Your Surgery.                 Do not use any chewable tobacco products for at least 6 hours prior to                 surgery.  ____  5.  Bring all medications with you on the day of surgery if instructed.   __X__  6.  Notify your doctor if there is any change in your medical condition      (cold, fever, infections).     Do not wear jewelry, make-up, hairpins, clips or nail polish. Do not wear lotions, powders, or perfumes.  Do not shave 48 hours prior to surgery. Men may shave face and neck. Do not bring valuables to the hospital.    [facility] is not responsible for any belongings or  valuables.  Contacts, dentures/partials or body piercings may not be worn into surgery. Bring a case for your contacts, glasses or hearing aids, a denture cup will be supplied. Leave your suitcase in the car. After surgery it may be brought to your room. For patients admitted to the hospital, discharge time is determined by your treatment team.   Patients discharged the day of surgery will not be allowed to drive home.   Please read over the following fact sheets that you were given:   MRSA Information  __X__ Take these medicines the morning of surgery with A SIP OF WATER:  1. cetirizine (ZYRTEC) 10 MG tablet  2.   3.   4.  5.  6.  ____ Fleet Enema (as directed)   __X__ Use CHG Soap/SAGE wipes as directed  ____ Use inhalers on the day of surgery  ____ Stop metformin/Janumet/Farxiga 2 days prior to surgery    ____ Take 1/2 of usual insulin dose the night before surgery. No insulin the morning          of surgery.   ____ Stop Blood Thinners Coumadin/Plavix/Xarelto/Pleta/Pradaxa/Eliquis/Effient/Aspirin  on   Or contact your Surgeon, Cardiologist or Medical Doctor regarding  ability to stop your blood thinners  __X__ Stop Anti-inflammatories 7 days before surgery such as Advil, Ibuprofen, Motrin,  BC or Goodies Powder, Naprosyn, Naproxen, Aleve, Aspirin    __X__ Stop all herbal supplements, fish oil or vitamin E until after surgery.    ____ Bring C-Pap to the hospital.   STOP VITAMIN C, FISH OIL, AND ZINC UNTIL AFTER SURGERY  CONTACT DR PABON'S OFFICE FOR ANY QUESTIONS ABOUT RETURNING TO WORK AND/OR ACTIVITY RESTRICTIONS AFTER SURGERY

## 2020-01-02 ENCOUNTER — Encounter
Admission: RE | Admit: 2020-01-02 | Discharge: 2020-01-02 | Disposition: A | Discharge status: Home / Self Care | Payer: BC Managed Care – PPO | Source: Ambulatory Visit | Attending: Surgery | Admitting: Surgery

## 2020-01-02 ENCOUNTER — Other Ambulatory Visit: Payer: Self-pay

## 2020-01-02 DIAGNOSIS — Z01818 Encounter for other preprocedural examination: Secondary | ICD-10-CM | POA: Diagnosis present

## 2020-01-02 LAB — CBC
HCT: 39 % (ref 39.0–52.0)
Hemoglobin: 13.9 g/dL (ref 13.0–17.0)
MCH: 35.5 pg — ABNORMAL HIGH (ref 26.0–34.0)
MCHC: 35.6 g/dL (ref 30.0–36.0)
MCV: 99.7 fL (ref 80.0–100.0)
Platelets: 187 10*3/uL (ref 150–400)
RBC: 3.91 MIL/uL — ABNORMAL LOW (ref 4.22–5.81)
RDW: 13.4 % (ref 11.5–15.5)
WBC: 5.7 10*3/uL (ref 4.0–10.5)
nRBC: 0 % (ref 0.0–0.2)

## 2020-01-02 MED ORDER — LIDOCAINE 4 % EX PTCH: 1.0000 | MEDICATED_PATCH | Freq: Two times a day (BID) | CUTANEOUS | 0 refills | Status: AC

## 2020-01-03 ENCOUNTER — Ambulatory Visit: Payer: BC Managed Care – PPO | Admitting: Family Medicine

## 2020-01-04 ENCOUNTER — Other Ambulatory Visit: Payer: Self-pay

## 2020-01-04 ENCOUNTER — Other Ambulatory Visit
Admission: RE | Admit: 2020-01-04 | Discharge: 2020-01-04 | Disposition: A | Discharge status: Home / Self Care | Payer: BC Managed Care – PPO | Source: Ambulatory Visit | Attending: Surgery | Admitting: Surgery

## 2020-01-04 DIAGNOSIS — Z20822 Contact with and (suspected) exposure to covid-19: Secondary | ICD-10-CM | POA: Insufficient documentation

## 2020-01-04 DIAGNOSIS — Z01812 Encounter for preprocedural laboratory examination: Secondary | ICD-10-CM | POA: Insufficient documentation

## 2020-01-04 LAB — SARS CORONAVIRUS 2 (TAT 6-24 HRS): SARS Coronavirus 2: NEGATIVE

## 2020-01-05 ENCOUNTER — Encounter: Payer: Self-pay | Admitting: Family Medicine

## 2020-01-05 ENCOUNTER — Other Ambulatory Visit: Payer: Self-pay

## 2020-01-05 ENCOUNTER — Ambulatory Visit (INDEPENDENT_AMBULATORY_CARE_PROVIDER_SITE_OTHER): Payer: BC Managed Care – PPO | Admitting: Family Medicine

## 2020-01-05 VITALS — BP 132/88 | HR 81 | Temp 98.5°F | Ht 67.72 in | Wt 179.0 lb

## 2020-01-05 DIAGNOSIS — G4700 Insomnia, unspecified: Secondary | ICD-10-CM

## 2020-01-05 DIAGNOSIS — I10 Essential (primary) hypertension: Secondary | ICD-10-CM

## 2020-01-05 MED ORDER — FLUTICASONE PROPIONATE 50 MCG/ACT NA SUSP: 2.0000 | Freq: Two times a day (BID) | NASAL | 12 refills | Status: DC

## 2020-01-05 MED ORDER — LISINOPRIL 5 MG PO TABS: 5.0000 mg | ORAL_TABLET | Freq: Every day | ORAL | 1 refills | Status: DC

## 2020-01-05 MED ORDER — GABAPENTIN 300 MG PO CAPS: 300.0000 mg | ORAL_CAPSULE | Freq: Three times a day (TID) | ORAL | 1 refills | Status: DC

## 2020-01-05 MED ORDER — TRAZODONE HCL 100 MG PO TABS: 50.0000 mg | ORAL_TABLET | Freq: Every evening | ORAL | 1 refills | Status: DC | PRN

## 2020-01-05 MED ORDER — TRIAMCINOLONE ACETONIDE 0.5 % EX OINT: 1.0000 "application " | TOPICAL_OINTMENT | Freq: Two times a day (BID) | CUTANEOUS | 3 refills | Status: DC

## 2020-01-05 MED ORDER — MONTELUKAST SODIUM 10 MG PO TABS: 10.0000 mg | ORAL_TABLET | Freq: Every day | ORAL | 3 refills | Status: DC

## 2020-01-05 NOTE — Assessment & Plan Note (Signed)
Under good control on current regimen. Continue current regimen. Continue to monitor. Call with any concerns. Refills given.   

## 2020-01-05 NOTE — Progress Notes (Signed)
BP 132/88 (BP Location: Left Arm, Cuff Size: Normal)   Pulse 81   Temp 98.5 F (36.9 C) (Oral)   Ht 5' 7.72" (1.72 m)   Wt 179 lb (81.2 kg)   SpO2 97%   BMI 27.44 kg/m    Subjective:    Patient ID: Edward Mckenzie, male    DOB: Aug 01, 1971, 49 y.o.   MRN: [id number]  HPI: Edward Mckenzie is a 49 y.o. male  Chief Complaint  Patient presents with  . Hypertension   Having bilateral hernia surgery tomorrow.   HYPERTENSION Hypertension status: controlled  Satisfied with current treatment? yes Duration of hypertension: chronic BP monitoring frequency:  not checking BP medication side effects:  no Medication compliance: good compliance Previous BP meds: lisinopril Aspirin: no Recurrent headaches: no Visual changes: no Palpitations: no Dyspnea: no Chest pain: no Lower extremity edema: no Dizzy/lightheaded: no  INSOMNIA Duration: chronic Satisfied with sleep quality: yes Difficulty falling asleep: no Difficulty staying asleep: no Waking a few hours after sleep onset: no Early morning awakenings: no Daytime hypersomnolence: no Wakes feeling refreshed: no Good sleep hygiene: no Apnea: no Snoring: no Depressed/anxious mood: no Recent stress: no Restless legs/nocturnal leg cramps: no Chronic pain/arthritis: no History of sleep study: no Treatments attempted: trazodone    Relevant past medical, surgical, family and social history reviewed and updated as indicated. Interim medical history since our last visit reviewed. Allergies and medications reviewed and updated.  Review of Systems  Constitutional: Negative.   Respiratory: Negative.   Cardiovascular: Negative.   Gastrointestinal: Negative.   Musculoskeletal: Negative.   Neurological: Negative.   Psychiatric/Behavioral: Negative.     Per HPI unless specifically indicated above     Objective:    BP 132/88 (BP Location: Left Arm, Cuff Size: Normal)   Pulse 81   Temp 98.5 F (36.9 C)  (Oral)   Ht 5' 7.72" (1.72 m)   Wt 179 lb (81.2 kg)   SpO2 97%   BMI 27.44 kg/m   Wt Readings from Last 3 Encounters:  01/05/20 179 lb (81.2 kg)  12/30/19 182 lb (82.6 kg)  12/26/19 182 lb (82.6 kg)    Physical Exam Vitals and nursing note reviewed.  Constitutional:      General: He is not in acute distress.    Appearance: Normal appearance. He is not ill-appearing, toxic-appearing or diaphoretic.  HENT:     Head: Normocephalic and atraumatic.     Right Ear: External ear normal.     Left Ear: External ear normal.     Nose: Nose normal.     Mouth/Throat:     Mouth: Mucous membranes are moist.     Pharynx: Oropharynx is clear.  Eyes:     General: No scleral icterus.       Right eye: No discharge.        Left eye: No discharge.     Extraocular Movements: Extraocular movements intact.     Conjunctiva/sclera: Conjunctivae normal.     Pupils: Pupils are equal, round, and reactive to light.  Cardiovascular:     Rate and Rhythm: Normal rate and regular rhythm.     Pulses: Normal pulses.     Heart sounds: Normal heart sounds. No murmur. No friction rub. No gallop.   Pulmonary:     Effort: Pulmonary effort is normal. No respiratory distress.     Breath sounds: Normal breath sounds. No stridor. No wheezing, rhonchi or rales.  Chest:     Chest wall: No  tenderness.  Musculoskeletal:        General: Normal range of motion.     Cervical back: Normal range of motion and neck supple.  Skin:    General: Skin is warm and dry.     Capillary Refill: Capillary refill takes less than 2 seconds.     Coloration: Skin is not jaundiced or pale.     Findings: No bruising, erythema, lesion or rash.  Neurological:     General: No focal deficit present.     Mental Status: He is alert and oriented to person, place, and time. Mental status is at baseline.  Psychiatric:        Mood and Affect: Mood normal.        Behavior: Behavior normal.        Thought Content: Thought content normal.         Judgment: Judgment normal.     Results for orders placed or performed during the hospital encounter of 01/04/20  SARS CORONAVIRUS 2 (TAT 6-24 HRS) Nasopharyngeal Nasopharyngeal Swab   Specimen: Nasopharyngeal Swab  Result Value Ref Range   SARS Coronavirus 2 NEGATIVE NEGATIVE      Assessment & Plan:   Problem List Items Addressed This Visit      Cardiovascular and Mediastinum   HTN (hypertension) - Primary    Under good control on current regimen. Continue current regimen. Continue to monitor. Call with any concerns. Refills given. Labs drawn today.       Relevant Medications   lisinopril (ZESTRIL) 5 MG tablet   Other Relevant Orders   Basic metabolic panel     Other   Insomnia    Under good control on current regimen. Continue current regimen. Continue to monitor. Call with any concerns. Refills given.           Follow up plan: Return in about 6 months (around 07/07/2020) for physical.

## 2020-01-05 NOTE — Assessment & Plan Note (Signed)
Under good control on current regimen. Continue current regimen. Continue to monitor. Call with any concerns. Refills given. Labs drawn today.   

## 2020-01-06 ENCOUNTER — Ambulatory Visit: Payer: BC Managed Care – PPO | Admitting: Certified Registered Nurse Anesthetist

## 2020-01-06 ENCOUNTER — Encounter: Payer: Self-pay | Admitting: Surgery

## 2020-01-06 ENCOUNTER — Other Ambulatory Visit: Payer: Self-pay

## 2020-01-06 ENCOUNTER — Encounter
Admission: RE | Disposition: A | Discharge status: Home / Self Care | Payer: Self-pay | Source: Home / Self Care | Attending: Surgery

## 2020-01-06 ENCOUNTER — Ambulatory Visit
Admission: RE | Admit: 2020-01-06 | Discharge: 2020-01-06 | Disposition: A | Discharge status: Home / Self Care | Payer: BC Managed Care – PPO | Attending: Surgery | Admitting: Surgery

## 2020-01-06 DIAGNOSIS — F17210 Nicotine dependence, cigarettes, uncomplicated: Secondary | ICD-10-CM | POA: Insufficient documentation

## 2020-01-06 DIAGNOSIS — Z79899 Other long term (current) drug therapy: Secondary | ICD-10-CM | POA: Diagnosis not present

## 2020-01-06 DIAGNOSIS — I10 Essential (primary) hypertension: Secondary | ICD-10-CM | POA: Insufficient documentation

## 2020-01-06 DIAGNOSIS — K4020 Bilateral inguinal hernia, without obstruction or gangrene, not specified as recurrent: Secondary | ICD-10-CM | POA: Insufficient documentation

## 2020-01-06 HISTORY — PX: XI ROBOTIC ASSISTED INGUINAL HERNIA REPAIR WITH MESH: SHX6706

## 2020-01-06 LAB — BASIC METABOLIC PANEL
BUN/Creatinine Ratio: 6 — ABNORMAL LOW (ref 9–20)
BUN: 4 mg/dL — ABNORMAL LOW (ref 6–24)
CO2: 21 mmol/L (ref 20–29)
Calcium: 9.3 mg/dL (ref 8.7–10.2)
Chloride: 103 mmol/L (ref 96–106)
Creatinine, Ser: 0.67 mg/dL — ABNORMAL LOW (ref 0.76–1.27)
GFR calc Af Amer: 131 mL/min/{1.73_m2} (ref >59)
GFR calc non Af Amer: 114 mL/min/{1.73_m2} (ref >59)
Glucose: 84 mg/dL (ref 65–99)
Potassium: 4.4 mmol/L (ref 3.5–5.2)
Sodium: 138 mmol/L (ref 134–144)

## 2020-01-06 SURGERY — REPAIR, HERNIA, INGUINAL, ROBOT-ASSISTED, LAPAROSCOPIC, USING MESH
Anesthesia: General | Laterality: Bilateral

## 2020-01-06 MED ORDER — FENTANYL CITRATE (PF) 100 MCG/2ML IJ SOLN
INTRAMUSCULAR | Status: DC | PRN
  Administered 2020-01-06: 100 ug via INTRAVENOUS
  Administered 2020-01-06: 50 ug via INTRAVENOUS

## 2020-01-06 MED ORDER — EPHEDRINE SULFATE 50 MG/ML IJ SOLN
INTRAMUSCULAR | Status: DC | PRN
  Administered 2020-01-06 (×3): 5 mg via INTRAVENOUS

## 2020-01-06 MED ORDER — MIDAZOLAM HCL 2 MG/2ML IJ SOLN
INTRAMUSCULAR | Status: DC | PRN
  Administered 2020-01-06: 2 mg via INTRAVENOUS

## 2020-01-06 MED ORDER — ROCURONIUM BROMIDE 10 MG/ML (PF) SYRINGE
PREFILLED_SYRINGE | INTRAVENOUS | Status: AC
  Filled 2020-01-06: qty 10

## 2020-01-06 MED ORDER — BUPIVACAINE LIPOSOME 1.3 % IJ SUSP
INTRAMUSCULAR | Status: AC
  Filled 2020-01-06: qty 20

## 2020-01-06 MED ORDER — FENTANYL CITRATE (PF) 100 MCG/2ML IJ SOLN
INTRAMUSCULAR | Status: AC
  Filled 2020-01-06: qty 2

## 2020-01-06 MED ORDER — GLYCOPYRROLATE 0.2 MG/ML IJ SOLN
INTRAMUSCULAR | Status: AC
  Filled 2020-01-06: qty 1

## 2020-01-06 MED ORDER — DEXAMETHASONE SODIUM PHOSPHATE 10 MG/ML IJ SOLN
INTRAMUSCULAR | Status: DC | PRN
  Administered 2020-01-06: 10 mg via INTRAVENOUS

## 2020-01-06 MED ORDER — CHLORHEXIDINE GLUCONATE 0.12 % MT SOLN
OROMUCOSAL | Status: AC
  Administered 2020-01-06: 15 mL via OROMUCOSAL
  Filled 2020-01-06: qty 15

## 2020-01-06 MED ORDER — ORAL CARE MOUTH RINSE: 15.0000 mL | Freq: Once | OROMUCOSAL | Status: AC

## 2020-01-06 MED ORDER — SUGAMMADEX SODIUM 200 MG/2ML IV SOLN
INTRAVENOUS | Status: DC | PRN
  Administered 2020-01-06: 200 mg via INTRAVENOUS

## 2020-01-06 MED ORDER — BUPIVACAINE-EPINEPHRINE 0.25% -1:200000 IJ SOLN
INTRAMUSCULAR | Status: DC | PRN
  Administered 2020-01-06: 30 mL

## 2020-01-06 MED ORDER — HYDROCODONE-ACETAMINOPHEN 5-325 MG PO TABS: 1.0000 | ORAL_TABLET | ORAL | 0 refills | Status: DC | PRN

## 2020-01-06 MED ORDER — ONDANSETRON HCL 4 MG/2ML IJ SOLN
INTRAMUSCULAR | Status: AC
  Filled 2020-01-06: qty 2

## 2020-01-06 MED ORDER — DEXAMETHASONE SODIUM PHOSPHATE 10 MG/ML IJ SOLN
INTRAMUSCULAR | Status: AC
  Filled 2020-01-06: qty 1

## 2020-01-06 MED ORDER — MIDAZOLAM HCL 2 MG/2ML IJ SOLN
INTRAMUSCULAR | Status: AC
  Filled 2020-01-06: qty 2

## 2020-01-06 MED ORDER — GABAPENTIN 300 MG PO CAPS
ORAL_CAPSULE | ORAL | Status: AC
  Administered 2020-01-06: 300 mg via ORAL
  Filled 2020-01-06: qty 1

## 2020-01-06 MED ORDER — EPHEDRINE 5 MG/ML INJ
INTRAVENOUS | Status: AC
  Filled 2020-01-06: qty 10

## 2020-01-06 MED ORDER — ONDANSETRON HCL 4 MG/2ML IJ SOLN
INTRAMUSCULAR | Status: DC | PRN
  Administered 2020-01-06: 4 mg via INTRAVENOUS

## 2020-01-06 MED ORDER — PROMETHAZINE HCL 25 MG/ML IJ SOLN: 6.2500 mg | INTRAMUSCULAR | Status: DC | PRN

## 2020-01-06 MED ORDER — LIDOCAINE HCL (PF) 2 % IJ SOLN
INTRAMUSCULAR | Status: AC
  Filled 2020-01-06: qty 5

## 2020-01-06 MED ORDER — LACTATED RINGERS IV SOLN: INTRAVENOUS | Status: DC

## 2020-01-06 MED ORDER — ACETAMINOPHEN 500 MG PO TABS: 1000.0000 mg | ORAL_TABLET | ORAL | Status: AC

## 2020-01-06 MED ORDER — FAMOTIDINE 20 MG PO TABS
ORAL_TABLET | ORAL | Status: AC
  Administered 2020-01-06: 20 mg via ORAL
  Filled 2020-01-06: qty 1

## 2020-01-06 MED ORDER — PHENYLEPHRINE HCL (PRESSORS) 10 MG/ML IV SOLN
INTRAVENOUS | Status: DC | PRN
  Administered 2020-01-06 (×5): 200 ug via INTRAVENOUS

## 2020-01-06 MED ORDER — GABAPENTIN 300 MG PO CAPS: 300.0000 mg | ORAL_CAPSULE | ORAL | Status: AC

## 2020-01-06 MED ORDER — ACETAMINOPHEN 500 MG PO TABS
ORAL_TABLET | ORAL | Status: AC
  Administered 2020-01-06: 1000 mg via ORAL
  Filled 2020-01-06: qty 2

## 2020-01-06 MED ORDER — CHLORHEXIDINE GLUCONATE CLOTH 2 % EX PADS: 6.0000 | MEDICATED_PAD | Freq: Once | CUTANEOUS | Status: DC

## 2020-01-06 MED ORDER — FAMOTIDINE 20 MG PO TABS: 20.0000 mg | ORAL_TABLET | Freq: Once | ORAL | Status: AC

## 2020-01-06 MED ORDER — FENTANYL CITRATE (PF) 100 MCG/2ML IJ SOLN
25.0000 ug | INTRAMUSCULAR | Status: DC | PRN
  Administered 2020-01-06 (×2): 50 ug via INTRAVENOUS

## 2020-01-06 MED ORDER — CELECOXIB 200 MG PO CAPS: 200.0000 mg | ORAL_CAPSULE | ORAL | Status: AC

## 2020-01-06 MED ORDER — PROPOFOL 10 MG/ML IV BOLUS
INTRAVENOUS | Status: DC | PRN
  Administered 2020-01-06: 200 mg via INTRAVENOUS

## 2020-01-06 MED ORDER — ROCURONIUM BROMIDE 100 MG/10ML IV SOLN
INTRAVENOUS | Status: DC | PRN
  Administered 2020-01-06 (×2): 50 mg via INTRAVENOUS
  Administered 2020-01-06: 20 mg via INTRAVENOUS

## 2020-01-06 MED ORDER — CELECOXIB 200 MG PO CAPS
ORAL_CAPSULE | ORAL | Status: AC
  Administered 2020-01-06: 200 mg via ORAL
  Filled 2020-01-06: qty 1

## 2020-01-06 MED ORDER — CEFAZOLIN SODIUM-DEXTROSE 2-4 GM/100ML-% IV SOLN
2.0000 g | INTRAVENOUS | Status: AC
  Administered 2020-01-06: 2 g via INTRAVENOUS

## 2020-01-06 MED ORDER — FENTANYL CITRATE (PF) 100 MCG/2ML IJ SOLN
INTRAMUSCULAR | Status: AC
  Administered 2020-01-06: 50 ug via INTRAVENOUS
  Filled 2020-01-06: qty 2

## 2020-01-06 MED ORDER — LIDOCAINE HCL (CARDIAC) PF 100 MG/5ML IV SOSY
PREFILLED_SYRINGE | INTRAVENOUS | Status: DC | PRN
  Administered 2020-01-06: 100 mg via INTRAVENOUS

## 2020-01-06 MED ORDER — BUPIVACAINE LIPOSOME 1.3 % IJ SUSP
INTRAMUSCULAR | Status: DC | PRN
  Administered 2020-01-06: 20 mL

## 2020-01-06 MED ORDER — CEFAZOLIN SODIUM-DEXTROSE 2-4 GM/100ML-% IV SOLN
INTRAVENOUS | Status: AC
  Filled 2020-01-06: qty 100

## 2020-01-06 MED ORDER — CHLORHEXIDINE GLUCONATE 0.12 % MT SOLN: 15.0000 mL | Freq: Once | OROMUCOSAL | Status: AC

## 2020-01-06 MED ORDER — PROPOFOL 10 MG/ML IV BOLUS
INTRAVENOUS | Status: AC
  Filled 2020-01-06: qty 20

## 2020-01-06 SURGICAL SUPPLY — 51 items
ADH SKN CLS APL DERMABOND .7 (GAUZE/BANDAGES/DRESSINGS) ×1
APL PRP STRL LF DISP 70% ISPRP (MISCELLANEOUS) ×1
CANISTER SUCT 1200ML W/VALVE (MISCELLANEOUS) ×2 IMPLANT
CANNULA REDUC XI 12-8 STAPL (CANNULA) ×2
CANNULA REDUCER 12-8 DVNC XI (CANNULA) IMPLANT
CHLORAPREP W/TINT 26 (MISCELLANEOUS) ×2 IMPLANT
COVER TIP SHEARS 8 DVNC (MISCELLANEOUS) ×1 IMPLANT
COVER TIP SHEARS 8MM DA VINCI (MISCELLANEOUS) ×2
COVER WAND RF STERILE (DRAPES) ×2 IMPLANT
DEFOGGER SCOPE WARMER CLEARIFY (MISCELLANEOUS) ×2 IMPLANT
DERMABOND ADVANCED (GAUZE/BANDAGES/DRESSINGS) ×1
DERMABOND ADVANCED .7 DNX12 (GAUZE/BANDAGES/DRESSINGS) ×1 IMPLANT
DRAPE 3/4 80X56 (DRAPES) ×1 IMPLANT
DRAPE ARM DVNC X/XI (DISPOSABLE) ×3 IMPLANT
DRAPE COLUMN DVNC XI (DISPOSABLE) ×1 IMPLANT
DRAPE DA VINCI XI ARM (DISPOSABLE) ×8
DRAPE DA VINCI XI COLUMN (DISPOSABLE) ×2
ELECT CAUTERY BLADE 6.4 (BLADE) ×2 IMPLANT
ELECT REM PT RETURN 9FT ADLT (ELECTROSURGICAL) ×2
ELECTRODE REM PT RTRN 9FT ADLT (ELECTROSURGICAL) ×1 IMPLANT
GLOVE BIO SURGEON STRL SZ7 (GLOVE) ×8 IMPLANT
GOWN STRL REUS W/ TWL LRG LVL3 (GOWN DISPOSABLE) ×4 IMPLANT
GOWN STRL REUS W/TWL LRG LVL3 (GOWN DISPOSABLE) ×8
IRRIGATION STRYKERFLOW (MISCELLANEOUS) ×1 IMPLANT
IRRIGATOR STRYKERFLOW (MISCELLANEOUS)
IV NS 1000ML (IV SOLUTION)
IV NS 1000ML BAXH (IV SOLUTION) IMPLANT
KIT PINK PAD W/HEAD ARE REST (MISCELLANEOUS) ×2
KIT PINK PAD W/HEAD ARM REST (MISCELLANEOUS) ×1 IMPLANT
LABEL OR SOLS (LABEL) ×2 IMPLANT
MESH 3DMAX 4X6 LT LRG (Mesh General) ×1 IMPLANT
MESH 3DMAX 4X6 RT LRG (Mesh General) ×1 IMPLANT
NEEDLE HYPO 22GX1.5 SAFETY (NEEDLE) ×2 IMPLANT
OBTURATOR OPTICAL STANDARD 8MM (TROCAR) ×2
OBTURATOR OPTICAL STND 8 DVNC (TROCAR) ×1
OBTURATOR OPTICALSTD 8 DVNC (TROCAR) ×1 IMPLANT
PACK LAP CHOLECYSTECTOMY (MISCELLANEOUS) ×2 IMPLANT
PENCIL ELECTRO HAND CTR (MISCELLANEOUS) ×2 IMPLANT
SEAL CANN UNIV 5-8 DVNC XI (MISCELLANEOUS) ×3 IMPLANT
SEAL XI 5MM-8MM UNIVERSAL (MISCELLANEOUS) ×6
SET TUBE SMOKE EVAC HIGH FLOW (TUBING) ×2 IMPLANT
SOLUTION ELECTROLUBE (MISCELLANEOUS) ×2 IMPLANT
SPONGE LAP 18X18 RF (DISPOSABLE) ×2 IMPLANT
STAPLER CANNULA SEAL DVNC XI (STAPLE) IMPLANT
STAPLER CANNULA SEAL XI (STAPLE) ×2
SUT MNCRL AB 4-0 PS2 18 (SUTURE) ×2 IMPLANT
SUT VIC AB 2-0 SH 27 (SUTURE) ×4
SUT VIC AB 2-0 SH 27XBRD (SUTURE) ×2 IMPLANT
SUT VICRYL 0 AB UR-6 (SUTURE) ×4 IMPLANT
SUT VLOC 90 S/L VL9 GS22 (SUTURE) ×3 IMPLANT
TROCAR BALLN GELPORT 12X130M (ENDOMECHANICALS) ×2 IMPLANT

## 2020-01-06 NOTE — Anesthesia Postprocedure Evaluation (Signed)
Anesthesia Post Note  Patient: Edward Mckenzie  Procedure(s) Performed: XI ROBOTIC ASSISTED INGUINAL HERNIA REPAIR WITH MESH (Bilateral )  Patient location during evaluation: PACU Anesthesia Type: General Level of consciousness: awake and alert Pain management: pain level controlled Vital Signs Assessment: post-procedure vital signs reviewed and stable Respiratory status: spontaneous breathing, nonlabored ventilation, respiratory function stable and patient connected to nasal cannula oxygen Cardiovascular status: blood pressure returned to baseline and stable Postop Assessment: no apparent nausea or vomiting Anesthetic complications: no     Last Vitals:  Vitals:   01/06/20 1110 01/06/20 1143  BP: 134/79 136/73  Pulse: 77 71  Resp: (!) 21 19  Temp: 36.8 C 36.6 C  SpO2: 97%     Last Pain:  Vitals:   01/06/20 1143  TempSrc:   PainSc: 4                  Martha Clan

## 2020-01-06 NOTE — Discharge Instructions (Addendum)
AMBULATORY SURGERY  DISCHARGE INSTRUCTIONS   1) The drugs that you were given will stay in your system until tomorrow so for the next 24 hours you should not:  A) Drive an automobile B) Make any legal decisions C) Drink any alcoholic beverage   2) You may resume regular meals tomorrow.  Today it is better to start with liquids and gradually work up to solid foods.  You may eat anything you prefer, but it is better to start with liquids, then soup and crackers, and gradually work up to solid foods.   3) Please notify your doctor immediately if you have any unusual bleeding, trouble breathing, redness and pain at the surgery site, drainage, fever, or pain not relieved by medication.    4) Additional Instructions:        Please contact your physician with any problems or Same Day Surgery at [phone], Monday through Friday 6 am to 4 pm, or [facility] [place] Main number at [phone].   Laparoscopic Inguinal Hernia Repair, Adult, Care After This sheet gives you information about how to care for yourself after your procedure. Your health care provider may also give you more specific instructions. If you have problems or questions, contact your health care provider. What can I expect after the procedure? After the procedure, it is common to have:  Pain.  Swelling and bruising around the incision area.  Scrotal swelling, in men.  Some fluid or blood draining from your incisions. Follow these instructions at home: Incision care  Follow instructions from your health care provider about how to take care of your incisions. Make sure you: ? Wash your hands with soap and water before you change your bandage (dressing). If soap and water are not available, use hand sanitizer. ? Change your dressing as told by your health care provider. ? Leave stitches (sutures), skin glue, or adhesive strips in place. These skin closures may need to stay in place for 2 weeks or longer.  If adhesive strip edges start to loosen and curl up, you may trim the loose edges. Do not remove adhesive strips completely unless your health care provider tells you to do that.  Check your incision area every day for signs of infection. Check for: ? More redness, swelling, or pain. ? More fluid or blood. ? Warmth. ? Pus or a bad smell.  Wear loose, soft clothing while your incisions heal. Driving  Do not drive or use heavy machinery while taking prescription pain medicine.  Do not drive for 24 hours if you were given a medicine to help you relax (sedative) during your procedure. Activity  Do not lift anything that is heavier than 10 lb (4.5 kg), or the limit that you are told, until your health care provider says that it is safe.  Ask your health care provider what activities are safe for you. A lot of activity during the first week after surgery can increase pain and swelling. For 1 week after your procedure: ? Avoid activities that take a lot of effort, such as exercise or sports. ? You may walk and climb stairs as needed for daily activity, but avoid long walks or climbing stairs for exercise. Managing pain and swelling   Put ice on painful or swollen areas: ? Put ice in a plastic bag. ? Place a towel between your skin and the bag. ? Leave the ice on for 20 minutes, 2-3 times a day. General instructions  Do not take baths, swim, or use   a hot tub until your health care provider approves. Ask your health care provider if you may take showers. You may only be allowed to take sponge baths.  Take over-the-counter and prescription medicines only as told by your health care provider.  To prevent or treat constipation while you are taking prescription pain medicine, your health care provider may recommend that you: ? Drink enough fluid to keep your urine pale yellow. ? Take over-the-counter or prescription medicines. ? Eat foods that are high in fiber, such as fresh fruits and  vegetables, whole grains, and beans. ? Limit foods that are high in fat and processed sugars, such as fried and sweet foods.  Do not use any products that contain nicotine or tobacco, such as cigarettes and e-cigarettes. If you need help quitting, ask your health care provider.  Drink enough fluid to keep your urine pale yellow.  Keep all follow-up visits as told by your health care provider. This is important. Contact a health care provider if:  You have more redness, swelling, or pain around your incisions or your groin area.  You have more swelling in your scrotum.  You have more fluid or blood coming from your incisions.  Your incisions feel warm to the touch.  You have severe pain and medicines do not help.  You have abdominal pain or swelling.  You cannot eat or drink without vomiting.  You cannot urinate or pass a bowel movement.  You faint.  You feel dizzy.  You have nausea and vomiting.  You have a fever. Get help right away if:  You have pus or a bad smell coming from your incisions.  You have redness, warmth, or pain in your leg.  You have chest pain.  You have problems breathing. Summary  Pain, swelling, and bruising are common after the procedure.  Check your incision area every day for signs of infection, such as more redness, swelling, or pain.  Put ice on painful or swollen areas for 20 minutes, 2-3 times a day. This information is not intended to replace advice given to you by your health care provider. Make sure you discuss any questions you have with your health care provider. Document Revised: 01/05/2019 Document Reviewed: 11/06/2016 Elsevier Patient Education  2020 [employer]  

## 2020-01-06 NOTE — Transfer of Care (Addendum)
Immediate Anesthesia Transfer of Care Note  Patient: Edward Mckenzie  Procedure(s) Performed: XI ROBOTIC ASSISTED INGUINAL HERNIA REPAIR WITH MESH (Bilateral )  Patient Location: PACU  Anesthesia Type:General  Level of Consciousness: awake, alert  and oriented  Airway & Oxygen Therapy: Patient Spontanous Breathing and Patient connected to nasal cannula oxygen  Post-op Assessment: Report given to RN and Post -op Vital signs reviewed and stable  Post vital signs: Reviewed and stable  Last Vitals:  Vitals Value Taken Time  BP 133/87 01/06/20 1100  Temp 36.6 C 01/06/20 1100  Pulse 74 01/06/20 1108  Resp 14 01/06/20 1108  SpO2 96 % 01/06/20 1108  Vitals shown include unvalidated device data.  Last Pain:  Vitals:   01/06/20 1100  TempSrc:   PainSc: 7          Complications: No apparent anesthesia complications

## 2020-01-06 NOTE — Interval H&P Note (Signed)
History and Physical Interval Note:  01/06/2020 7:25 AM  Edward Mckenzie  has presented today for surgery, with the diagnosis of Robotic bilateral inguinal hernia repair.  The various methods of treatment have been discussed with the patient and family. After consideration of risks, benefits and other options for treatment, the patient has consented to  Procedure(s): XI ROBOTIC [place] (Bilateral) as a surgical intervention.  The patient's history has been reviewed, patient examined, no change in status, stable for surgery.  I have reviewed the patient's chart and labs.  Questions were answered to the patient's satisfaction.     [place]

## 2020-01-06 NOTE — Anesthesia Preprocedure Evaluation (Signed)
Anesthesia Evaluation  Patient identified by MRN, date of birth, ID band Patient awake    Reviewed: Allergy & Precautions, H&P , NPO status , Patient's Chart, lab work & pertinent test results, reviewed documented beta blocker date and time   History of Anesthesia Complications Negative for: history of anesthetic complications  Airway Mallampati: III  TM Distance: >3 FB Neck ROM: full    Dental  (+) Dental Advidsory Given, Teeth Intact   Pulmonary neg shortness of breath, neg COPD, neg recent URI, Current Smoker,    Pulmonary exam normal breath sounds clear to auscultation       Cardiovascular Exercise Tolerance: Good hypertension, (-) angina(-) Past MI Normal cardiovascular exam(-) dysrhythmias (-) Valvular Problems/Murmurs Rhythm:regular Rate:Normal     Neuro/Psych negative neurological ROS  negative psych ROS   GI/Hepatic negative GI ROS, Neg liver ROS,   Endo/Other  negative endocrine ROS  Renal/GU negative Renal ROS  negative genitourinary   Musculoskeletal   Abdominal   Peds  Hematology negative hematology ROS (+)   Anesthesia Other Findings Past Medical History: No date: Carpal tunnel syndrome of right wrist No date: Hypertension No date: Jones fracture     Comment:  left foot No date: Wears contact lenses   Reproductive/Obstetrics negative OB ROS                             Anesthesia Physical Anesthesia Plan  ASA: II  Anesthesia Plan: General   Post-op Pain Management:    Induction: Intravenous  PONV Risk Score and Plan: 1 and Ondansetron, Dexamethasone, Midazolam, Promethazine and Treatment may vary due to age or medical condition  Airway Management Planned: Oral ETT  Additional Equipment:   Intra-op Plan:   Post-operative Plan: Extubation in OR  Informed Consent: I have reviewed the patients History and Physical, chart, labs and discussed the procedure  including the risks, benefits and alternatives for the proposed anesthesia with the patient or authorized representative who has indicated his/her understanding and acceptance.     Dental Advisory Given  Plan Discussed with: Anesthesiologist, CRNA and Surgeon  Anesthesia Plan Comments:         Anesthesia Quick Evaluation

## 2020-01-06 NOTE — Anesthesia Procedure Notes (Signed)
Procedure Name: Intubation Date/Time: 01/06/2020 8:31 AM Performed by: Johnna Acosta, CRNA Pre-anesthesia Checklist: Patient identified, Emergency Drugs available, Suction available, Patient being monitored and Timeout performed Patient Re-evaluated:Patient Re-evaluated prior to induction Oxygen Delivery Method: Circle system utilized Preoxygenation: Pre-oxygenation with 100% oxygen Induction Type: IV induction Ventilation: Mask ventilation without difficulty and Oral airway inserted - appropriate to patient size Laryngoscope Size: McGraph and 3 Grade View: Grade II Tube type: Oral Tube size: 7.5 mm Number of attempts: 1 Airway Equipment and Method: Stylet and Video-laryngoscopy Placement Confirmation: ETT inserted through vocal cords under direct vision,  positive ETCO2 and breath sounds checked- equal and bilateral Secured at: 22 cm Tube secured with: Tape Dental Injury: Teeth and Oropharynx as per pre-operative assessment

## 2020-01-06 NOTE — Op Note (Signed)
Robotic assisted Laparoscopic Transabdominal Bilateral Inguinal Hernia Repair with 3 D large Mesh       Pre-operative Diagnosis:  Bialteral Inguinal Hernias   Post-operative Diagnosis: Same   Procedure: Robotic  Laparoscopic  repair of Bialteral inguinal hernias   Surgeon: Caroleen Hamman, MD FACS   Anesthesia: Gen. with endotracheal tube   Findings: Bialteral inguinal hernias Direct    Procedure Details  The patient was seen again in the Holding Room. The benefits, complications, treatment options, and expected outcomes were discussed with the patient. The risks of bleeding, infection, recurrence of symptoms, failure to resolve symptoms, recurrence of hernia, ischemic orchitis, chronic pain syndrome or neuroma, were discussed again. The likelihood of improving the patient's symptoms with return to their baseline status is good.  The patient and/or family concurred with the proposed plan, giving informed consent.  The patient was taken to Operating Room, identified  and the procedure verified as Laparoscopic Inguinal Hernia Repair. Laterality confirmed.  A Time Out was held and the above information confirmed.   Prior to the induction of general anesthesia, antibiotic prophylaxis was administered. VTE prophylaxis was in place. General endotracheal anesthesia was then administered and tolerated well. After the induction, the abdomen was prepped with Chloraprep and draped in the sterile fashion. The patient was positioned in the supine position.     Supraumbilical incision created and cut down technique used to enter the abdominal cavity. Fascia elevated and incised and hasson trochar placed. Pneumoperitoneum obtained w/o HD changes. No evidence of bowel injuries.  Two 8 mm placed under direct vision. The laparoscopy revealed large indirect defects. I inserted the needles and the mesh. The robot was brought ot the table and docked in the standard fashion, no collision between arms was observed.  Instruments were kept under direct view at all times. We started on the left side were a flap was created. The sac was reduced and dissected free from adjacent structures. We preserved the vas and the vessels. Once dissection was completed a large 3D mesh was placed and secured with two interrupted vicryl attached to the pubic tubercle. There was good coverage of the direct, indirect and femoral spaces. The flap was closed with v lock suture.   Attention then was turned to the Right side were a flap was created. The sac was reduced and dissected free from adjacent structures. We preserved the vas and the vessels. Once dissection was completed a large 3D mesh was placed and secured with two interrupted vicryl attached to the pubic tubercle. There was good coverage of the direct, indirect and femoral spaces. The flap was closed with v lock suture. Second look revealed no complications or injuries.     Once assuring that hemostasis was adequate the ports were removed and a figure-of-eight 0 Vicryl suture was placed at the fascial edges. 4-0 subcuticular Monocryl was used at all skin edges. Dermabond was placed.  Patient tolerated the procedure well. There were no complications. He was taken to the recovery room in stable condition.                 Caroleen Hamman, MD, FACS

## 2020-01-11 ENCOUNTER — Other Ambulatory Visit: Payer: Self-pay

## 2020-01-11 ENCOUNTER — Telehealth (INDEPENDENT_AMBULATORY_CARE_PROVIDER_SITE_OTHER): Payer: BC Managed Care – PPO | Admitting: Surgery

## 2020-01-11 DIAGNOSIS — Z09 Encounter for follow-up examination after completed treatment for conditions other than malignant neoplasm: Secondary | ICD-10-CM

## 2020-01-11 NOTE — Progress Notes (Signed)
Called pt on his cellphone , doing very well.  And some minimal discomfort.  Pain preoperatively has improved significantly.  No fevers no chills.  He is tolerating regular diet.  He is ambulating.  He states that he has small bruise on the right side which he suspected given bilateral disease and laparoscopic approach.  A/P very well expected postoperative course.  Avoid any heavy lifting.  Return to clinic as needed

## 2020-06-04 ENCOUNTER — Other Ambulatory Visit: Payer: Self-pay | Admitting: Family Medicine

## 2020-07-10 ENCOUNTER — Ambulatory Visit (INDEPENDENT_AMBULATORY_CARE_PROVIDER_SITE_OTHER): Payer: BC Managed Care – PPO | Admitting: Family Medicine

## 2020-07-10 ENCOUNTER — Other Ambulatory Visit: Payer: Self-pay

## 2020-07-10 ENCOUNTER — Encounter: Payer: Self-pay | Admitting: Family Medicine

## 2020-07-10 VITALS — BP 122/73 | HR 80 | Temp 98.3°F | Ht 67.6 in | Wt 173.0 lb

## 2020-07-10 DIAGNOSIS — R7989 Other specified abnormal findings of blood chemistry: Secondary | ICD-10-CM

## 2020-07-10 DIAGNOSIS — R059 Cough, unspecified: Secondary | ICD-10-CM | POA: Diagnosis not present

## 2020-07-10 DIAGNOSIS — Z1211 Encounter for screening for malignant neoplasm of colon: Secondary | ICD-10-CM

## 2020-07-10 DIAGNOSIS — G4700 Insomnia, unspecified: Secondary | ICD-10-CM

## 2020-07-10 DIAGNOSIS — I10 Essential (primary) hypertension: Secondary | ICD-10-CM

## 2020-07-10 DIAGNOSIS — Z0000 Encounter for general adult medical examination without abnormal findings: Secondary | ICD-10-CM | POA: Diagnosis not present

## 2020-07-10 LAB — URINALYSIS, ROUTINE W REFLEX MICROSCOPIC
Bilirubin, UA: NEGATIVE
Glucose, UA: NEGATIVE
Ketones, UA: NEGATIVE
Leukocytes,UA: NEGATIVE
Nitrite, UA: NEGATIVE
Protein,UA: NEGATIVE
RBC, UA: NEGATIVE
Specific Gravity, UA: 1.01 (ref 1.005–1.030)
Urobilinogen, Ur: 0.2 mg/dL (ref 0.2–1.0)
pH, UA: 6.5 (ref 5.0–7.5)

## 2020-07-10 LAB — MICROALBUMIN, URINE WAIVED
Creatinine, Urine Waived: 10 mg/dL (ref 10–300)
Microalb, Ur Waived: 10 mg/L (ref 0–19)
Microalb/Creat Ratio: <30 mg/g (ref <30)

## 2020-07-10 MED ORDER — GABAPENTIN 300 MG PO CAPS: ORAL_CAPSULE | ORAL | 1 refills | Status: DC

## 2020-07-10 MED ORDER — TRAZODONE HCL 100 MG PO TABS: 50.0000 mg | ORAL_TABLET | Freq: Every evening | ORAL | 1 refills | Status: DC | PRN

## 2020-07-10 MED ORDER — PREDNISONE 50 MG PO TABS: 50.0000 mg | ORAL_TABLET | Freq: Every day | ORAL | 0 refills | Status: DC

## 2020-07-10 MED ORDER — LISINOPRIL 5 MG PO TABS: 5.0000 mg | ORAL_TABLET | Freq: Every day | ORAL | 1 refills | Status: DC

## 2020-07-10 NOTE — Patient Instructions (Addendum)
Health Maintenance, Male Adopting a healthy lifestyle and getting preventive care are important in promoting health and wellness. Ask your health care provider about:  The right schedule for you to have regular tests and exams.  Things you can do on your own to prevent diseases and keep yourself healthy. What should I know about diet, weight, and exercise? Eat a healthy diet   Eat a diet that includes plenty of vegetables, fruits, low-fat dairy products, and lean protein.  Do not eat a lot of foods that are high in solid fats, added sugars, or sodium. Maintain a healthy weight Body mass index (BMI) is a measurement that can be used to identify possible weight problems. It estimates body fat based on height and weight. Your health care provider can help determine your BMI and help you achieve or maintain a healthy weight. Get regular exercise Get regular exercise. This is one of the most important things you can do for your health. Most adults should:  Exercise for at least 150 minutes each week. The exercise should increase your heart rate and make you sweat (moderate-intensity exercise).  Do strengthening exercises at least twice a week. This is in addition to the moderate-intensity exercise.  Spend less time sitting. Even light physical activity can be beneficial. Watch cholesterol and blood lipids Have your blood tested for lipids and cholesterol at 48 years of age, then have this test every 5 years. You may need to have your cholesterol levels checked more often if:  Your lipid or cholesterol levels are high.  You are older than 48 years of age.  You are at high risk for heart disease. What should I know about cancer screening? Many types of cancers can be detected early and may often be prevented. Depending on your health history and family history, you may need to have cancer screening at various ages. This may include screening for:  Colorectal cancer.  Prostate  cancer.  Skin cancer.  Lung cancer. What should I know about heart disease, diabetes, and high blood pressure? Blood pressure and heart disease  High blood pressure causes heart disease and increases the risk of stroke. This is more likely to develop in people who have high blood pressure readings, are of African descent, or are overweight.  Talk with your health care provider about your target blood pressure readings.  Have your blood pressure checked: ? Every 3-5 years if you are 18-39 years of age. ? Every year if you are 40 years old or older.  If you are between the ages of 65 and 75 and are a current or former smoker, ask your health care provider if you should have a one-time screening for abdominal aortic aneurysm (AAA). Diabetes Have regular diabetes screenings. This checks your fasting blood sugar level. Have the screening done:  Once every three years after age 45 if you are at a normal weight and have a low risk for diabetes.  More often and at a younger age if you are overweight or have a high risk for diabetes. What should I know about preventing infection? Hepatitis B If you have a higher risk for hepatitis B, you should be screened for this virus. Talk with your health care provider to find out if you are at risk for hepatitis B infection. Hepatitis C Blood testing is recommended for:  Everyone born from 1945 through 1965.  Anyone with known risk factors for hepatitis C. Sexually transmitted infections (STIs)  You should be screened each year   for STIs, including gonorrhea and chlamydia, if: ? You are sexually active and are younger than 48 years of age. ? You are older than 48 years of age and your health care provider tells you that you are at risk for this type of infection. ? Your sexual activity has changed since you were last screened, and you are at increased risk for chlamydia or gonorrhea. Ask your health care provider if you are at risk.  Ask your  health care provider about whether you are at high risk for HIV. Your health care provider may recommend a prescription medicine to help prevent HIV infection. If you choose to take medicine to prevent HIV, you should first get tested for HIV. You should then be tested every 3 months for as long as you are taking the medicine. Follow these instructions at home: Lifestyle  Do not use any products that contain nicotine or tobacco, such as cigarettes, e-cigarettes, and chewing tobacco. If you need help quitting, ask your health care provider.  Do not use street drugs.  Do not share needles.  Ask your health care provider for help if you need support or information about quitting drugs. Alcohol use  Do not drink alcohol if your health care provider tells you not to drink.  If you drink alcohol: ? Limit how much you have to 0-2 drinks a day. ? Be aware of how much alcohol is in your drink. In the [place] one drink equals one 12 oz bottle of beer (355 mL), one 5 oz glass of wine (148 mL), or one 1 oz glass of hard liquor (44 mL). General instructions  Schedule regular health, dental, and eye exams.  Stay current with your vaccines.  Tell your health care provider if: ? You often feel depressed. ? You have ever been abused or do not feel safe at home. Summary  Adopting a healthy lifestyle and getting preventive care are important in promoting health and wellness.  Follow your health care provider's instructions about healthy diet, exercising, and getting tested or screened for diseases.  Follow your health care provider's instructions on monitoring your cholesterol and blood pressure. This information is not intended to replace advice given to you by your health care provider. Make sure you discuss any questions you have with your health care provider. Document Revised: 07/21/2018 Document Reviewed: 07/21/2018 Elsevier Patient Education  [place].  Tennis Elbow  Tennis elbow  (lateral epicondylitis) is inflammation of tendons in your outer forearm, near your elbow. Tendons are tissues that connect muscle to bone. When you have tennis elbow, inflammation affects the tendons that you use to bend your wrist and move your hand up. Inflammation occurs in the lower part of the upper arm bone (humerus), where the tendons connect to the bone (lateral epicondyle). Tennis elbow often affects people who play tennis, but anyone may get the condition from repeatedly extending the wrist or turning the forearm. What are the causes? This condition is usually caused by repeatedly extending the wrist, turning the forearm, and using the hands. It can result from sports or work that requires repetitive forearm movements. In some cases, it may be caused by a sudden injury. What increases the risk? You are more likely to develop tennis elbow if you play tennis or another racket sport. You also have a higher risk if you frequently use your hands for work. Besides people who play tennis, others at greater risk include:  Musicians.  Carpenters, painters, and plumbers.  Cooks.  Cashiers.  People who work in [employer].  Architect workers.  Butchers.  People who use computers. What are the signs or symptoms? Symptoms of this condition include:  Pain and tenderness in the forearm and the outer part of the elbow. Pain may be felt only when using the arm, or it may be there all the time.  A burning feeling that starts in the elbow and spreads down the forearm.  A weak grip in the hand. How is this diagnosed? This condition may be diagnosed based on:  Your symptoms and medical history.  A physical exam.  X-rays.  MRI. How is this treated? Resting and icing your arm is often the first treatment. Your health care provider may also recommend:  Medicines to reduce pain and inflammation. These may be in the form of a pill, topical gels, or shots of a steroid medicine  (cortisone).  An elbow strap to reduce stress on the area.  Physical therapy. This may include massage or exercises.  An elbow brace to restrict the movements that cause symptoms. If these treatments do not help relieve your symptoms, your health care provider may recommend surgery to remove damaged muscle and reattach healthy muscle to bone. Follow these instructions at home: Activity  Rest your elbow and wrist and avoid activities that cause symptoms, as told by your health care provider.  Do physical therapy exercises as instructed.  If you lift an object, lift it with your palm facing up. This reduces stress on your elbow. Lifestyle  If your tennis elbow is caused by sports, check your equipment and make sure that: ? You are using it correctly. ? It is the best fit for you.  If your tennis elbow is caused by work or computer use, take frequent breaks to stretch your arm. Talk with your manager about ways to manage your condition at work. If you have a brace:  Wear the brace or strap as told by your health care provider. Remove it only as told by your health care provider.  Loosen the brace if your fingers tingle, become numb, or turn cold and blue.  Keep the brace clean.  If the brace is not waterproof, ask if you may remove it for bathing. If you must keep the brace on while bathing: ? Do not let it get wet. ? Cover it with a watertight covering when you take a bath or a shower. General instructions   If directed, put ice on the painful area: ? Put ice in a plastic bag. ? Place a towel between your skin and the bag. ? Leave the ice on for 20 minutes, 2-3 times a day.  Take over-the-counter and prescription medicines only as told by your health care provider.  Keep all follow-up visits as told by your health care provider. This is important. Contact a health care provider if:  You have pain that gets worse or does not get better with treatment.  You have numbness  or weakness in your forearm, hand, or fingers. Summary  Tennis elbow (lateral epicondylitis) is inflammation of tendons in your outer forearm, near your elbow.  Common symptoms include pain and tenderness in your forearm and the outer part of your elbow.  This condition is usually caused by repeatedly extending your wrist, turning your forearm, and using your hands.  The first treatment is often resting and icing your arm to relieve symptoms. Further treatment may include taking medicine, getting physical therapy, wearing a brace or strap, or having  surgery. This information is not intended to replace advice given to you by your health care provider. Make sure you discuss any questions you have with your health care provider. Document Revised: 04/23/2018 Document Reviewed: 05/12/2017 Elsevier Patient Education  [place].

## 2020-07-10 NOTE — Progress Notes (Signed)
BP 122/73   Pulse 80   Temp 98.3 F (36.8 C) (Oral)   Ht 5' 7.6" (1.717 m)   Wt 173 lb (78.5 kg)   SpO2 99%   BMI 26.62 kg/m    Subjective:    Patient ID: Edward Mckenzie, male    DOB: Feb 17, 1971, 49 y.o.   MRN: [id number]  HPI: Edward Mckenzie is a 49 y.o. male presenting on 07/10/2020 for comprehensive medical examination. Current medical complaints include:  UPPER RESPIRATORY TRACT INFECTION Duration: 2 weeks Worst symptom: congestion Fever: no Cough: yes Shortness of breath: no Wheezing: yes Chest pain: no Chest tightness: no Chest congestion: yes Nasal congestion: yes Runny nose: yes Post nasal drip: yes Sneezing: no Sore throat: no Swollen glands: no Sinus pressure: yes Headache: no Face pain: no Toothache: no Ear pain: no  Ear pressure: yes bilateral Eyes red/itching:yes Eye drainage/crusting: yes  Vomiting: no Rash: no Fatigue: yes Sick contacts: no Strep contacts: no  Context: better Recurrent sinusitis: no Relief with OTC cold/cough medications: no  Treatments attempted: cold/sinus   HYPERTENSION Hypertension status: controlled  Satisfied with current treatment? no Duration of hypertension: chronic BP monitoring frequency:  not checking BP medication side effects:  no Medication compliance: excellent compliance Previous BP meds:lisinopril Aspirin: no Recurrent headaches: no Visual changes: no Palpitations: no Dyspnea: no Chest pain: no Lower extremity edema: no Dizzy/lightheaded: no  INSOMNIA Duration: chronic Satisfied with sleep quality: yes Difficulty falling asleep: no Difficulty staying asleep: no Waking a few hours after sleep onset: no Early morning awakenings: no Daytime hypersomnolence: no Wakes feeling refreshed: no Good sleep hygiene: no Apnea: no Snoring: no Depressed/anxious mood: no Recent stress: yes Restless legs/nocturnal leg cramps: no Chronic pain/arthritis: no  Interim Problems from his  last visit: no  Depression Screen done today and results listed below:  Depression screen [facility] 2/9 07/10/2020 07/05/2019 05/25/2018 05/21/2017 01/24/2016  Decreased Interest 0 0 0 0 0  Down, Depressed, Hopeless 0 0 0 0 0  PHQ - 2 Score 0 0 0 0 0  Altered sleeping - 0 2 - 3  Tired, decreased energy - 0 0 - 2  Change in appetite - 0 0 - 0  Feeling bad or failure about yourself  - 0 0 - 0  Trouble concentrating - 0 0 - 0  Moving slowly or fidgety/restless - 0 0 - 0  Suicidal thoughts - 0 0 - 0  PHQ-9 Score - 0 2 - 5  Difficult doing work/chores - Not difficult at all Somewhat difficult - -    Past Medical History:  Past Medical History:  Diagnosis Date  . Carpal tunnel syndrome of right wrist   . Hypertension   . Jones fracture    left foot  . Wears contact lenses     Surgical History:  Past Surgical History:  Procedure Laterality Date  . BACK SURGERY    . BREAST BIOPSY Left 06/22/2018   [place] bx BENIGN FIBROFATTY TISSUE WITH DEGENERATIVE CHANGES  . HERNIA REPAIR    . KNEE SURGERY    . NASAL SINUS SURGERY  02/22/98   UPPP, tonguepexy, septo.  Dr. Kathyrn Sheriff. [place]  . ORIF TOE FRACTURE Left 12/20/2015   Procedure: OPEN REDUCTION INTERNAL FIXATION (ORIF) 5TH METATARSAL (TOE) FRACTURE LEFT;  Surgeon: Albertine Patricia, DPM;  Location: [place];  Service: Podiatry;  Laterality: Left;  LMA WITH POPLITEAL  . TONSILLECTOMY AND ADENOIDECTOMY    . UVULOPALATOPHARYNGOPLASTY    . XI ROBOTIC ASSISTED INGUINAL  HERNIA REPAIR WITH MESH Bilateral 01/06/2020   Procedure: XI ROBOTIC ASSISTED INGUINAL HERNIA REPAIR WITH MESH;  Surgeon: Jules Husbands, MD;  Location: [facility];  Service: General;  Laterality: Bilateral;    Medications:  Current Outpatient Medications on File Prior to Visit  Medication Sig  . Ascorbic Acid (VITAMIN C) 100 MG tablet Take 100 mg by mouth daily.  Marland Kitchen azelastine (ASTELIN) 0.1 % nasal spray Place 2 sprays into both nostrils daily.   . cetirizine (ZYRTEC) 10 MG tablet  Take 1 tablet (10 mg total) by mouth daily.  . fluticasone (FLONASE) 50 MCG/ACT nasal spray Place 2 sprays into both nostrils in the morning and at bedtime.  . metroNIDAZOLE (METROGEL) 1 % gel Apply topically daily.  . montelukast (SINGULAIR) 10 MG tablet Take 1 tablet (10 mg total) by mouth at bedtime.  . Multiple Vitamin (ONE-A-DAY MENS PO) Take 1 tablet by mouth daily.   . Multiple Vitamins-Minerals (ZINC PO) Take 1 tablet by mouth daily.   . Omega-3 Fatty Acids (FISH OIL) 1000 MG CAPS Take 1,000 mg by mouth daily.  Marland Kitchen thiamine (VITAMIN B-1) 100 MG tablet Take 100 mg by mouth daily.  Marland Kitchen triamcinolone ointment (KENALOG) 0.5 % Apply 1 application topically 2 (two) times daily.  Marland Kitchen VITAMIN D PO Take 1 tablet by mouth daily.   Marland Kitchen HYDROcodone-acetaminophen (NORCO/VICODIN) 5-325 MG tablet Take 1-2 tablets by mouth every 4 (four) hours as needed for moderate pain. (Patient not taking: Reported on 07/10/2020)   No current facility-administered medications on file prior to visit.    Allergies:  No Known Allergies  Social History:  Social History   Socioeconomic History  . Marital status: Single    Spouse name: Not on file  . Number of children: Not on file  . Years of education: Not on file  . Highest education level: Not on file  Occupational History  . Not on file  Tobacco Use  . Smoking status: Current Every Day Smoker    Packs/day: 1.50    Years: 15.00    Pack years: 22.50    Types: Cigarettes  . Smokeless tobacco: Former Network engineer  . Vaping Use: Never used  Substance and Sexual Activity  . Alcohol use: Yes    Alcohol/week: 0.0 standard drinks    Comment: occ  . Drug use: No  . Sexual activity: Yes  Other Topics Concern  . Not on file  Social History Narrative  . Not on file   Social Determinants of Health   Financial Resource Strain:   . Difficulty of Paying Living Expenses: Not on file  Food Insecurity:   . Worried About Charity fundraiser in the Last Year:  Not on file  . Ran Out of Food in the Last Year: Not on file  Transportation Needs:   . Lack of Transportation (Medical): Not on file  . Lack of Transportation (Non-Medical): Not on file  Physical Activity:   . Days of Exercise per Week: Not on file  . Minutes of Exercise per Session: Not on file  Stress:   . Feeling of Stress : Not on file  Social Connections:   . Frequency of Communication with Friends and Family: Not on file  . Frequency of Social Gatherings with Friends and Family: Not on file  . Attends Religious Services: Not on file  . Active Member of Clubs or Organizations: Not on file  . Attends Archivist Meetings: Not on file  . Marital  Status: Not on file  Intimate Partner Violence:   . Fear of Current or Ex-Partner: Not on file  . Emotionally Abused: Not on file  . Physically Abused: Not on file  . Sexually Abused: Not on file   Social History   Tobacco Use  Smoking Status Current Every Day Smoker  . Packs/day: 1.50  . Years: 15.00  . Pack years: 22.50  . Types: Cigarettes  Smokeless Tobacco Former Systems developer   Social History   Substance and Sexual Activity  Alcohol Use Yes  . Alcohol/week: 0.0 standard drinks   Comment: occ    Family History:  Family History  Problem Relation Age of Onset  . Heart disease Father   . Diabetes Paternal Grandfather   . Heart disease Paternal Grandfather   . Cancer Paternal Grandfather        unknown location  . Breast cancer Neg Hx   . Colon cancer Neg Hx     Past medical history, surgical history, medications, allergies, family history and social history reviewed with patient today and changes made to appropriate areas of the chart.   Review of Systems  Constitutional: Negative.   HENT: Positive for congestion. Negative for ear discharge, ear pain, hearing loss, nosebleeds, sinus pain, sore throat and tinnitus.   Eyes: Negative.   Respiratory: Negative.  Negative for stridor.   Cardiovascular: Negative.     Gastrointestinal: Negative.   Genitourinary: Negative.   Musculoskeletal: Positive for joint pain and myalgias. Negative for back pain, falls and neck pain.  Skin: Negative.   Neurological: Negative.   Endo/Heme/Allergies: Positive for environmental allergies. Negative for polydipsia. Does not bruise/bleed easily.  Psychiatric/Behavioral: Negative.     All other ROS negative except what is listed above and in the HPI.      Objective:    BP 122/73   Pulse 80   Temp 98.3 F (36.8 C) (Oral)   Ht 5' 7.6" (1.717 m)   Wt 173 lb (78.5 kg)   SpO2 99%   BMI 26.62 kg/m   Wt Readings from Last 3 Encounters:  07/10/20 173 lb (78.5 kg)  01/06/20 179 lb 0.2 oz (81.2 kg)  01/05/20 179 lb (81.2 kg)    Physical Exam Vitals and nursing note reviewed.  Constitutional:      General: He is not in acute distress.    Appearance: Normal appearance. He is obese. He is not ill-appearing, toxic-appearing or diaphoretic.  HENT:     Head: Normocephalic and atraumatic.     Right Ear: Tympanic membrane, ear canal and external ear normal. There is no impacted cerumen.     Left Ear: Tympanic membrane, ear canal and external ear normal. There is no impacted cerumen.     Nose: Nose normal. No congestion or rhinorrhea.     Mouth/Throat:     Mouth: Mucous membranes are moist.     Pharynx: Oropharynx is clear. No oropharyngeal exudate or posterior oropharyngeal erythema.  Eyes:     General: No scleral icterus.       Right eye: No discharge.        Left eye: No discharge.     Extraocular Movements: Extraocular movements intact.     Conjunctiva/sclera: Conjunctivae normal.     Pupils: Pupils are equal, round, and reactive to light.  Neck:     Vascular: No carotid bruit.  Cardiovascular:     Rate and Rhythm: Normal rate and regular rhythm.     Pulses: Normal pulses.  Heart sounds: No murmur heard.  No friction rub. No gallop.   Pulmonary:     Effort: Pulmonary effort is normal. No respiratory  distress.     Breath sounds: Normal breath sounds. No stridor. No wheezing, rhonchi or rales.  Chest:     Chest wall: No tenderness.  Abdominal:     General: Abdomen is flat. Bowel sounds are normal. There is no distension.     Palpations: Abdomen is soft. There is no mass.     Tenderness: There is no abdominal tenderness. There is no right CVA tenderness, left CVA tenderness, guarding or rebound.     Hernia: No hernia is present.  Genitourinary:    Comments: Genital exam deferred with shared decision making Musculoskeletal:        General: No swelling, tenderness, deformity or signs of injury.     Cervical back: Normal range of motion and neck supple. No rigidity. No muscular tenderness.     Right lower leg: No edema.     Left lower leg: No edema.  Lymphadenopathy:     Cervical: No cervical adenopathy.  Skin:    General: Skin is warm and dry.     Capillary Refill: Capillary refill takes less than 2 seconds.     Coloration: Skin is not jaundiced or pale.     Findings: No bruising, erythema, lesion or rash.  Neurological:     General: No focal deficit present.     Mental Status: He is alert and oriented to person, place, and time.     Cranial Nerves: No cranial nerve deficit.     Sensory: No sensory deficit.     Motor: No weakness.     Coordination: Coordination normal.     Gait: Gait normal.     Deep Tendon Reflexes: Reflexes normal.  Psychiatric:        Mood and Affect: Mood normal.        Behavior: Behavior normal.        Thought Content: Thought content normal.        Judgment: Judgment normal.     Results for orders placed or performed in visit on 07/10/20  Novel Coronavirus, NAA ([facility])   Specimen: Saline  Result Value Ref Range   SARS-CoV-2, NAA Not Detected Not Detected  SARS-COV-2, NAA 2 DAY TAT  Result Value Ref Range   SARS-CoV-2, NAA 2 DAY TAT Performed   CBC with Differential/Platelet  Result Value Ref Range   WBC 6.9 3.4 - 10.8 x10E3/uL   RBC 4.23  4.14 - 5.80 x10E6/uL   Hemoglobin 14.8 13.0 - 17.7 g/dL   Hematocrit 42.7 37.5 - 51.0 %   MCV 101 (H) 79 - 97 fL   MCH 35.0 (H) 26.6 - 33.0 pg   MCHC 34.7 31 - 35 g/dL   RDW 11.7 11.6 - 15.4 %   Platelets 287 150 - 450 x10E3/uL   Neutrophils 66 Not Estab. %   Lymphs 25 Not Estab. %   Monocytes 8 Not Estab. %   Eos 1 Not Estab. %   Basos 0 Not Estab. %   Neutrophils Absolute 4.5 1.40 - 7.00 x10E3/uL   Lymphocytes Absolute 1.7 0 - 3 x10E3/uL   Monocytes Absolute 0.6 0 - 0 x10E3/uL   EOS (ABSOLUTE) 0.1 0.0 - 0.4 x10E3/uL   Basophils Absolute 0.0 0 - 0 x10E3/uL   Immature Granulocytes 0 Not Estab. %   Immature Grans (Abs) 0.0 0.0 - 0.1 x10E3/uL  Comprehensive metabolic panel  Result Value Ref Range   Glucose 104 (H) 65 - 99 mg/dL   BUN 6 6 - 24 mg/dL   Creatinine, Ser 0.66 (L) 0.76 - 1.27 mg/dL   GFR calc non Af Amer 114 >59 mL/min/1.73   GFR calc Af Amer 132 >59 mL/min/1.73   BUN/Creatinine Ratio 9 9 - 20   Sodium 138 134 - 144 mmol/L   Potassium 4.4 3.5 - 5.2 mmol/L   Chloride 104 96 - 106 mmol/L   CO2 19 (L) 20 - 29 mmol/L   Calcium 9.4 8.7 - 10.2 mg/dL   Total Protein 6.7 6.0 - 8.5 g/dL   Albumin 4.3 4.0 - 5.0 g/dL   Globulin, Total 2.4 1.5 - 4.5 g/dL   Albumin/Globulin Ratio 1.8 1.2 - 2.2   Bilirubin Total 0.2 0.0 - 1.2 mg/dL   Alkaline Phosphatase 58 44 - 121 IU/L   AST 22 0 - 40 IU/L   ALT 22 0 - 44 IU/L  Lipid Panel w/o Chol/HDL Ratio  Result Value Ref Range   Cholesterol, Total 211 (H) 100 - 199 mg/dL   Triglycerides 138 0 - 149 mg/dL   HDL 46 >39 mg/dL   VLDL Cholesterol Cal 25 5 - 40 mg/dL   LDL Chol Calc (NIH) 140 (H) 0 - 99 mg/dL  Microalbumin, Urine Waived  Result Value Ref Range   Microalb, Ur Waived 10 0 - 19 mg/L   Creatinine, Urine Waived 10 10 - 300 mg/dL   Microalb/Creat Ratio <30 <30 mg/g  PSA  Result Value Ref Range   Prostate Specific Ag, Serum 0.8 0.0 - 4.0 ng/mL  TSH  Result Value Ref Range   TSH 0.661 0.450 - 4.500 uIU/mL  Urinalysis,  Routine w reflex microscopic  Result Value Ref Range   Specific Gravity, UA 1.010 1.005 - 1.030   pH, UA 6.5 5.0 - 7.5   Color, UA Yellow Yellow   Appearance Ur Clear Clear   Leukocytes,UA Negative Negative   Protein,UA Negative Negative/Trace   Glucose, UA Negative Negative   Ketones, UA Negative Negative   RBC, UA Negative Negative   Bilirubin, UA Negative Negative   Urobilinogen, Ur 0.2 0.2 - 1.0 mg/dL   Nitrite, UA Negative Negative  Hepatitis C Antibody  Result Value Ref Range   Hep C Virus Ab <0.1 0.0 - 0.9 s/co ratio      Assessment & Plan:   Problem List Items Addressed This Visit      Cardiovascular and Mediastinum   HTN (hypertension)    Under good control on current regimen. Continue current regimen. Continue to monitor. Call with any concerns. Refills given. Labs drawn today.        Relevant Medications   lisinopril (ZESTRIL) 5 MG tablet     Other   Insomnia    Under good control on current regimen. Continue current regimen. Continue to monitor. Call with any concerns. Refills given. Labs drawn today.        Relevant Orders   CBC with Differential/Platelet (Completed)   Comprehensive metabolic panel (Completed)    Other Visit Diagnoses    Routine general medical examination at a health care facility    -  Primary   Vaccines up to date/declined. Screening labs checked today. Cologuard ordered. Continue diet and exercise. Call with any concerns.    Relevant Orders   CBC with Differential/Platelet (Completed)   Comprehensive metabolic panel (Completed)   Lipid Panel w/o Chol/HDL Ratio (Completed)   Microalbumin, Urine Waived (Completed)  PSA (Completed)   TSH (Completed)   Urinalysis, Routine w reflex microscopic (Completed)   Hepatitis C Antibody (Completed)   Essential hypertension       Relevant Medications   lisinopril (ZESTRIL) 5 MG tablet   Other Relevant Orders   CBC with Differential/Platelet (Completed)   Comprehensive metabolic panel  (Completed)   Microalbumin, Urine Waived (Completed)   Abnormal thyroid blood test       Rechecking labs today. Await results. Treat as needed.    Relevant Orders   CBC with Differential/Platelet (Completed)   Comprehensive metabolic panel (Completed)   TSH (Completed)   Cough       Will check for covid. Await results. Will treat with prednisone. Call if not getting better or getting worse.    Relevant Orders   Novel Coronavirus, NAA ([facility]) (Completed)   Screening for colon cancer       Cologuard ordered today.   Relevant Orders   Cologuard      LABORATORY TESTING:  Health maintenance labs ordered today as discussed above.   The natural history of prostate cancer and ongoing controversy regarding screening and potential treatment outcomes of prostate cancer has been discussed with the patient. The meaning of a false positive PSA and a false negative PSA has been discussed. He indicates understanding of the limitations of this screening test and wishes to proceed with screening PSA testing.   IMMUNIZATIONS:   - Tdap: Tetanus vaccination status reviewed: last tetanus booster within 10 years. - Influenza: Refused - Pneumovax: Not applicable - COVID: Refused  SCREENING: - Colonoscopy: cologuard ordered today  Discussed with patient purpose of the colonoscopy is to detect colon cancer at curable precancerous or early stages   PATIENT COUNSELING:    Sexuality: Discussed sexually transmitted diseases, partner selection, use of condoms, avoidance of unintended pregnancy  and contraceptive alternatives.   Advised to avoid cigarette smoking.  I discussed with the patient that most people either abstain from alcohol or drink within safe limits (<=14/week and <=4 drinks/occasion for males, <=7/weeks and <= 3 drinks/occasion for females) and that the risk for alcohol disorders and other health effects rises proportionally with the number of drinks per week and how often a drinker  exceeds daily limits.  Discussed cessation/primary prevention of drug use and availability of treatment for abuse.   Diet: Encouraged to adjust caloric intake to maintain  or achieve ideal body weight, to reduce intake of dietary saturated fat and total fat, to limit sodium intake by avoiding high sodium foods and not adding table salt, and to maintain adequate dietary potassium and calcium preferably from fresh fruits, vegetables, and low-fat dairy products.    stressed the importance of regular exercise  Injury prevention: Discussed safety belts, safety helmets, smoke detector, smoking near bedding or upholstery.   Dental health: Discussed importance of regular tooth brushing, flossing, and dental visits.   Follow up plan: NEXT PREVENTATIVE PHYSICAL DUE IN 1 YEAR. Return in about 6 months (around 01/07/2021).

## 2020-07-11 LAB — CBC WITH DIFFERENTIAL/PLATELET
Basophils Absolute: 0 10*3/uL (ref 0.0–0.2)
Basos: 0 %
EOS (ABSOLUTE): 0.1 10*3/uL (ref 0.0–0.4)
Eos: 1 %
Hematocrit: 42.7 % (ref 37.5–51.0)
Hemoglobin: 14.8 g/dL (ref 13.0–17.7)
Immature Grans (Abs): 0 10*3/uL (ref 0.0–0.1)
Immature Granulocytes: 0 %
Lymphocytes Absolute: 1.7 10*3/uL (ref 0.7–3.1)
Lymphs: 25 %
MCH: 35 pg — ABNORMAL HIGH (ref 26.6–33.0)
MCHC: 34.7 g/dL (ref 31.5–35.7)
MCV: 101 fL — ABNORMAL HIGH (ref 79–97)
Monocytes Absolute: 0.6 10*3/uL (ref 0.1–0.9)
Monocytes: 8 %
Neutrophils Absolute: 4.5 10*3/uL (ref 1.4–7.0)
Neutrophils: 66 %
Platelets: 287 10*3/uL (ref 150–450)
RBC: 4.23 x10E6/uL (ref 4.14–5.80)
RDW: 11.7 % (ref 11.6–15.4)
WBC: 6.9 10*3/uL (ref 3.4–10.8)

## 2020-07-11 LAB — LIPID PANEL W/O CHOL/HDL RATIO
Cholesterol, Total: 211 mg/dL — ABNORMAL HIGH (ref 100–199)
HDL: 46 mg/dL (ref >39)
LDL Chol Calc (NIH): 140 mg/dL — ABNORMAL HIGH (ref 0–99)
Triglycerides: 138 mg/dL (ref 0–149)
VLDL Cholesterol Cal: 25 mg/dL (ref 5–40)

## 2020-07-11 LAB — HEPATITIS C ANTIBODY: Hep C Virus Ab: <0.1 s/co ratio (ref 0.0–0.9)

## 2020-07-11 LAB — COMPREHENSIVE METABOLIC PANEL
ALT: 22 IU/L (ref 0–44)
AST: 22 IU/L (ref 0–40)
Albumin/Globulin Ratio: 1.8 (ref 1.2–2.2)
Albumin: 4.3 g/dL (ref 4.0–5.0)
Alkaline Phosphatase: 58 IU/L (ref 44–121)
BUN/Creatinine Ratio: 9 (ref 9–20)
BUN: 6 mg/dL (ref 6–24)
Bilirubin Total: 0.2 mg/dL (ref 0.0–1.2)
CO2: 19 mmol/L — ABNORMAL LOW (ref 20–29)
Calcium: 9.4 mg/dL (ref 8.7–10.2)
Chloride: 104 mmol/L (ref 96–106)
Creatinine, Ser: 0.66 mg/dL — ABNORMAL LOW (ref 0.76–1.27)
GFR calc Af Amer: 132 mL/min/{1.73_m2} (ref >59)
GFR calc non Af Amer: 114 mL/min/{1.73_m2} (ref >59)
Globulin, Total: 2.4 g/dL (ref 1.5–4.5)
Glucose: 104 mg/dL — ABNORMAL HIGH (ref 65–99)
Potassium: 4.4 mmol/L (ref 3.5–5.2)
Sodium: 138 mmol/L (ref 134–144)
Total Protein: 6.7 g/dL (ref 6.0–8.5)

## 2020-07-11 LAB — TSH: TSH: 0.661 u[IU]/mL (ref 0.450–4.500)

## 2020-07-11 LAB — PSA: Prostate Specific Ag, Serum: 0.8 ng/mL (ref 0.0–4.0)

## 2020-07-12 LAB — NOVEL CORONAVIRUS, NAA: SARS-CoV-2, NAA: NOT DETECTED

## 2020-07-12 LAB — SARS-COV-2, NAA 2 DAY TAT

## 2020-07-12 NOTE — Assessment & Plan Note (Signed)
Under good control on current regimen. Continue current regimen. Continue to monitor. Call with any concerns. Refills given. Labs drawn today.   

## 2020-07-16 ENCOUNTER — Telehealth: Payer: Self-pay

## 2020-07-16 NOTE — Telephone Encounter (Signed)
This will have to be a visit with Dr.Johnson

## 2020-07-16 NOTE — Telephone Encounter (Signed)
Copied from [place] [id number]. Topic: General - Other >> Jul 16, 2020  8:43 AM Hinda Lenis D wrote: PT need an aapt for a shot on elbow / he spoke wit Jinny Blossom J about it / please advise Pt was on prednisone and it did not help he is asking if he can get on a nurse scheudle for shot for his elbow or if he needs apt.

## 2020-07-17 ENCOUNTER — Other Ambulatory Visit: Payer: Self-pay

## 2020-07-17 ENCOUNTER — Encounter: Payer: Self-pay | Admitting: Family Medicine

## 2020-07-17 ENCOUNTER — Ambulatory Visit: Payer: BC Managed Care – PPO | Admitting: Family Medicine

## 2020-07-17 VITALS — BP 145/87 | HR 76 | Temp 98.8°F | Wt 173.2 lb

## 2020-07-17 DIAGNOSIS — M7712 Lateral epicondylitis, left elbow: Secondary | ICD-10-CM | POA: Diagnosis not present

## 2020-07-17 DIAGNOSIS — D485 Neoplasm of uncertain behavior of skin: Secondary | ICD-10-CM | POA: Diagnosis not present

## 2020-07-17 NOTE — Progress Notes (Signed)
BP (!) 145/87   Pulse 76   Temp 98.8 F (37.1 C)   Wt 173 lb 3.2 oz (78.6 kg)   SpO2 98%   BMI 26.65 kg/m    Subjective:    Patient ID: Edward Mckenzie, male    DOB: Dec 20, 1970, 49 y.o.   MRN: [id number]  HPI: Edward Mckenzie is a 49 y.o. male  Chief Complaint  Patient presents with  . tennis elbow   ARM PAIN Duration: weeks Location: left elbow Mechanism of injury: unknown Onset: gradual Severity: severe  Quality:  Aching and sore Frequency: intermittent Radiation: no Aggravating factors: movement   Alleviating factors: nothing   Status: worse Treatments attempted: ibuprofen   Relief with NSAIDs?:  mild Swelling: no Redness: no  Warmth: no Trauma: no Chest pain: no  Shortness of breath: no  Fever: no Decreased sensation: no Paresthesias: no Weakness: no  Relevant past medical, surgical, family and social history reviewed and updated as indicated. Interim medical history since our last visit reviewed. Allergies and medications reviewed and updated.  Review of Systems  Constitutional: Negative.   Respiratory: Negative.   Cardiovascular: Negative.   Gastrointestinal: Negative.   Musculoskeletal: Positive for arthralgias. Negative for back pain, gait problem, joint swelling, myalgias, neck pain and neck stiffness.  Skin: Negative.   Neurological: Negative.   Psychiatric/Behavioral: Negative.     Per HPI unless specifically indicated above     Objective:    BP (!) 145/87   Pulse 76   Temp 98.8 F (37.1 C)   Wt 173 lb 3.2 oz (78.6 kg)   SpO2 98%   BMI 26.65 kg/m   Wt Readings from Last 3 Encounters:  07/17/20 173 lb 3.2 oz (78.6 kg)  07/10/20 173 lb (78.5 kg)  01/06/20 179 lb 0.2 oz (81.2 kg)    Physical Exam Vitals and nursing note reviewed.  Constitutional:      General: He is not in acute distress.    Appearance: Normal appearance. He is not ill-appearing, toxic-appearing or diaphoretic.  HENT:     Head: Normocephalic  and atraumatic.     Right Ear: External ear normal.     Left Ear: External ear normal.     Nose: Nose normal.     Mouth/Throat:     Mouth: Mucous membranes are moist.     Pharynx: Oropharynx is clear.  Eyes:     General: No scleral icterus.       Right eye: No discharge.        Left eye: No discharge.     Extraocular Movements: Extraocular movements intact.     Conjunctiva/sclera: Conjunctivae normal.     Pupils: Pupils are equal, round, and reactive to light.  Cardiovascular:     Rate and Rhythm: Normal rate and regular rhythm.     Pulses: Normal pulses.     Heart sounds: Normal heart sounds. No murmur heard.  No friction rub. No gallop.   Pulmonary:     Effort: Pulmonary effort is normal. No respiratory distress.     Breath sounds: Normal breath sounds. No stridor. No wheezing, rhonchi or rales.  Chest:     Chest wall: No tenderness.  Musculoskeletal:        General: Normal range of motion.     Cervical back: Normal range of motion and neck supple.     Comments: Tenderness to lateral epicondyle on the L elbow  Skin:    General: Skin is warm and dry.  Capillary Refill: Capillary refill takes less than 2 seconds.     Coloration: Skin is not jaundiced or pale.     Findings: No bruising, erythema, lesion or rash.     Comments: Hyperpigmented 1.5cm lesion on back of L ear  Neurological:     General: No focal deficit present.     Mental Status: He is alert and oriented to person, place, and time. Mental status is at baseline.  Psychiatric:        Mood and Affect: Mood normal.        Behavior: Behavior normal.        Thought Content: Thought content normal.        Judgment: Judgment normal.     Results for orders placed or performed in visit on 07/10/20  Novel Coronavirus, NAA ([facility])   Specimen: Saline  Result Value Ref Range   SARS-CoV-2, NAA Not Detected Not Detected  SARS-COV-2, NAA 2 DAY TAT  Result Value Ref Range   SARS-CoV-2, NAA 2 DAY TAT Performed   CBC  with Differential/Platelet  Result Value Ref Range   WBC 6.9 3.4 - 10.8 x10E3/uL   RBC 4.23 4.14 - 5.80 x10E6/uL   Hemoglobin 14.8 13.0 - 17.7 g/dL   Hematocrit 42.7 37.5 - 51.0 %   MCV 101 (H) 79 - 97 fL   MCH 35.0 (H) 26.6 - 33.0 pg   MCHC 34.7 31 - 35 g/dL   RDW 11.7 11.6 - 15.4 %   Platelets 287 150 - 450 x10E3/uL   Neutrophils 66 Not Estab. %   Lymphs 25 Not Estab. %   Monocytes 8 Not Estab. %   Eos 1 Not Estab. %   Basos 0 Not Estab. %   Neutrophils Absolute 4.5 1.40 - 7.00 x10E3/uL   Lymphocytes Absolute 1.7 0 - 3 x10E3/uL   Monocytes Absolute 0.6 0 - 0 x10E3/uL   EOS (ABSOLUTE) 0.1 0.0 - 0.4 x10E3/uL   Basophils Absolute 0.0 0 - 0 x10E3/uL   Immature Granulocytes 0 Not Estab. %   Immature Grans (Abs) 0.0 0.0 - 0.1 x10E3/uL  Comprehensive metabolic panel  Result Value Ref Range   Glucose 104 (H) 65 - 99 mg/dL   BUN 6 6 - 24 mg/dL   Creatinine, Ser 0.66 (L) 0.76 - 1.27 mg/dL   GFR calc non Af Amer 114 >59 mL/min/1.73   GFR calc Af Amer 132 >59 mL/min/1.73   BUN/Creatinine Ratio 9 9 - 20   Sodium 138 134 - 144 mmol/L   Potassium 4.4 3.5 - 5.2 mmol/L   Chloride 104 96 - 106 mmol/L   CO2 19 (L) 20 - 29 mmol/L   Calcium 9.4 8.7 - 10.2 mg/dL   Total Protein 6.7 6.0 - 8.5 g/dL   Albumin 4.3 4.0 - 5.0 g/dL   Globulin, Total 2.4 1.5 - 4.5 g/dL   Albumin/Globulin Ratio 1.8 1.2 - 2.2   Bilirubin Total 0.2 0.0 - 1.2 mg/dL   Alkaline Phosphatase 58 44 - 121 IU/L   AST 22 0 - 40 IU/L   ALT 22 0 - 44 IU/L  Lipid Panel w/o Chol/HDL Ratio  Result Value Ref Range   Cholesterol, Total 211 (H) 100 - 199 mg/dL   Triglycerides 138 0 - 149 mg/dL   HDL 46 >39 mg/dL   VLDL Cholesterol Cal 25 5 - 40 mg/dL   LDL Chol Calc (NIH) 140 (H) 0 - 99 mg/dL  Microalbumin, Urine Waived  Result Value Ref Range  Microalb, Ur Waived 10 0 - 19 mg/L   Creatinine, Urine Waived 10 10 - 300 mg/dL   Microalb/Creat Ratio <30 <30 mg/g  PSA  Result Value Ref Range   Prostate Specific Ag, Serum 0.8  0.0 - 4.0 ng/mL  TSH  Result Value Ref Range   TSH 0.661 0.450 - 4.500 uIU/mL  Urinalysis, Routine w reflex microscopic  Result Value Ref Range   Specific Gravity, UA 1.010 1.005 - 1.030   pH, UA 6.5 5.0 - 7.5   Color, UA Yellow Yellow   Appearance Ur Clear Clear   Leukocytes,UA Negative Negative   Protein,UA Negative Negative/Trace   Glucose, UA Negative Negative   Ketones, UA Negative Negative   RBC, UA Negative Negative   Bilirubin, UA Negative Negative   Urobilinogen, Ur 0.2 0.2 - 1.0 mg/dL   Nitrite, UA Negative Negative  Hepatitis C Antibody  Result Value Ref Range   Hep C Virus Ab <0.1 0.0 - 0.9 s/co ratio      Assessment & Plan:   Problem List Items Addressed This Visit    None    Visit Diagnoses    Lateral epicondylitis of left elbow    -  Primary   Injected today as below. Call with any concerns.    Neoplasm of uncertain behavior of skin       Will refer to dermatology. Call with any concerns.    Relevant Orders   Ambulatory referral to Dermatology      Procedure: Left Lateral Epicondylitis Steroid Injection        Diagnosis:   ICD-10-CM   1. Lateral epicondylitis of left elbow  M77.12    Injected today as below. Call with any concerns.   2. Neoplasm of uncertain behavior of skin  D48.5 Ambulatory referral to Dermatology   Will refer to dermatology. Call with any concerns.     Physician: MJ Consent:  Risks, benefits, and alternative treatments discussed and all questions were answered.  Patient elected to proceed and verbal consent obtained.  Description: Area prepped and draped using  semi-sterile technique.  After identifying area of maximal tenderness, A mixture of 2cc 1% lidocaine and 0.5cc Kenalog 40 was injected.  A bandage was then placed over the injection site. Complications: none Post Procedure Instructions: Wound care instructions discussed and patient was instructed to keep area clean and dry.  Signs and symptoms of infection discussed, patient  agrees to contact the office ASAP should they occur.  Follow Up: Return if symptoms worsen or fail to improve.   Follow up plan: Return if symptoms worsen or fail to improve.

## 2020-08-22 ENCOUNTER — Telehealth: Payer: Self-pay

## 2020-08-22 NOTE — Telephone Encounter (Signed)
Copied from [place] [id number]. Topic: General - Other >> Aug 22, 2020  9:11 AM Celene Kras wrote: Reason for CRM: Pt called stating that she still has not received his cologuard in the mail. Pt states that he is also having trouble with his left elbow again and is requesting to have another shot. Please advise.

## 2020-08-22 NOTE — Telephone Encounter (Signed)
Called pt advised that clinical is looking into colorgard. Pt had injection 12/7 advised to him that he would need to wait 3 months after last one pt is wondering if there is anything that the provider suggest being that he has been using Voltaren at least twice a day. Please advise

## 2020-08-22 NOTE — Telephone Encounter (Signed)
Can we please check on cologuard?  OK to book for injection if it's been 3 months after the last. If not, he can't have one yet

## 2020-08-22 NOTE — Telephone Encounter (Signed)
He can have PT or go to ortho. Let me know which he would like and I'll put the order in

## 2020-08-22 NOTE — Telephone Encounter (Signed)
Public librarian. Order was never received. Please complete new order form and place in folder for signature.

## 2020-08-23 NOTE — Telephone Encounter (Signed)
Form is complete and in Signature folder waiting for signature

## 2020-09-05 ENCOUNTER — Telehealth: Payer: Self-pay

## 2020-09-05 NOTE — Telephone Encounter (Signed)
Copied from [place] [id number]. Topic: General - Other >> Sep 05, 2020  9:37 AM Antonieta Iba C wrote: Reason for CRM: pt called in to make provider aware that his insurance will not cover colo guard. Pt says that they are stating that he has to be the age of 35.   Pt would like further assistance.  FYI

## 2020-09-05 NOTE — Telephone Encounter (Signed)
Routing to provider  

## 2020-09-05 NOTE — Telephone Encounter (Signed)
He is OK to wait or he can go to GI if he'd like to do colonoscopy

## 2020-09-05 NOTE — Telephone Encounter (Signed)
Patient will wait

## 2020-09-18 ENCOUNTER — Ambulatory Visit: Payer: BC Managed Care – PPO | Admitting: Family Medicine

## 2020-09-25 ENCOUNTER — Other Ambulatory Visit: Payer: Self-pay

## 2020-09-25 ENCOUNTER — Ambulatory Visit: Payer: BC Managed Care – PPO | Admitting: Family Medicine

## 2020-09-25 ENCOUNTER — Encounter: Payer: Self-pay | Admitting: Family Medicine

## 2020-09-25 VITALS — BP 143/84 | HR 70 | Temp 97.8°F | Wt 184.4 lb

## 2020-09-25 DIAGNOSIS — M436 Torticollis: Secondary | ICD-10-CM | POA: Diagnosis not present

## 2020-09-25 DIAGNOSIS — M7712 Lateral epicondylitis, left elbow: Secondary | ICD-10-CM | POA: Diagnosis not present

## 2020-09-25 MED ORDER — NAPROXEN 500 MG PO TABS
500.0000 mg | ORAL_TABLET | Freq: Two times a day (BID) | ORAL | 0 refills | Status: DC
Start: 2020-09-25 — End: 2021-01-08

## 2020-09-25 MED ORDER — CYCLOBENZAPRINE HCL 10 MG PO TABS
10.0000 mg | ORAL_TABLET | Freq: Every day | ORAL | 0 refills | Status: DC
Start: 2020-09-25 — End: 2020-11-26

## 2020-09-25 NOTE — Progress Notes (Signed)
BP (!) 143/84   Pulse 70   Temp 97.8 F (36.6 C)   Wt 184 lb 6.4 oz (83.6 kg)   SpO2 97%   BMI 28.37 kg/m    Subjective:    Patient ID: Edward Mckenzie, male    DOB: April 12, 1971, 50 y.o.   MRN: [id number]  HPI: Edward Mckenzie is a 50 y.o. male  Chief Complaint  Patient presents with  . Elbow Pain    Patient states his pain is consistent in his elbow     ARM PAIN Duration: weeks Location: left  elbow Mechanism of injury: unknown Onset: gradual Severity: severe  Quality:  Aching, sore, sharp Frequency: constant Radiation: no Aggravating factors:touch   Alleviating factors: nothing  Status: worse Treatments attempted: steroid injection, rest, ice, heat, APAP, ibuprofen, aleve and HEP  Relief with NSAIDs?:  no Swelling: no Redness: no  Warmth: no Trauma: no Chest pain: no  Shortness of breath: no  Fever: no Decreased sensation: no Paresthesias: yes Weakness: yes   Notes that he has had some pain in his neck for the past week or so. He notes that he has been having paresthesias radiating down his R arm and his middle finger has been numb for about the last 3 days. Nothing makes it better. He thinks sleeping funny to avoid sleeping on his elbow makes it worse. No other concerns or complaints at this time.   Relevant past medical, surgical, family and social history reviewed and updated as indicated. Interim medical history since our last visit reviewed. Allergies and medications reviewed and updated.  Review of Systems  Constitutional: Negative.   Respiratory: Negative.   Cardiovascular: Negative.   Gastrointestinal: Negative.   Musculoskeletal: Positive for arthralgias, myalgias, neck pain and neck stiffness. Negative for back pain, gait problem and joint swelling.  Skin: Negative.   Neurological: Positive for numbness. Negative for dizziness, tremors, seizures, syncope, facial asymmetry, speech difficulty, weakness, light-headedness and headaches.   Psychiatric/Behavioral: Negative.     Per HPI unless specifically indicated above     Objective:    BP (!) 143/84   Pulse 70   Temp 97.8 F (36.6 C)   Wt 184 lb 6.4 oz (83.6 kg)   SpO2 97%   BMI 28.37 kg/m   Wt Readings from Last 3 Encounters:  09/25/20 184 lb 6.4 oz (83.6 kg)  07/17/20 173 lb 3.2 oz (78.6 kg)  07/10/20 173 lb (78.5 kg)    Physical Exam Vitals and nursing note reviewed.  Constitutional:      General: He is not in acute distress.    Appearance: Normal appearance. He is not ill-appearing, toxic-appearing or diaphoretic.  HENT:     Head: Normocephalic and atraumatic.     Right Ear: External ear normal.     Left Ear: External ear normal.     Nose: Nose normal.     Mouth/Throat:     Mouth: Mucous membranes are moist.     Pharynx: Oropharynx is clear.  Eyes:     General: No scleral icterus.       Right eye: No discharge.        Left eye: No discharge.     Extraocular Movements: Extraocular movements intact.     Conjunctiva/sclera: Conjunctivae normal.     Pupils: Pupils are equal, round, and reactive to light.  Cardiovascular:     Rate and Rhythm: Normal rate and regular rhythm.     Pulses: Normal pulses.     Heart  sounds: Normal heart sounds. No murmur heard. No friction rub. No gallop.   Pulmonary:     Effort: Pulmonary effort is normal. No respiratory distress.     Breath sounds: Normal breath sounds. No stridor. No wheezing, rhonchi or rales.  Chest:     Chest wall: No tenderness.  Musculoskeletal:        General: Tenderness (lateral epicondyle on the L elbow) present. Normal range of motion.     Cervical back: Normal range of motion and neck supple.  Skin:    General: Skin is warm and dry.     Capillary Refill: Capillary refill takes less than 2 seconds.     Coloration: Skin is not jaundiced or pale.     Findings: No bruising, erythema, lesion or rash.  Neurological:     General: No focal deficit present.     Mental Status: He is alert  and oriented to person, place, and time. Mental status is at baseline.  Psychiatric:        Mood and Affect: Mood normal.        Behavior: Behavior normal.        Thought Content: Thought content normal.        Judgment: Judgment normal.     Results for orders placed or performed in visit on 07/10/20  Novel Coronavirus, NAA ([facility])   Specimen: Saline  Result Value Ref Range   SARS-CoV-2, NAA Not Detected Not Detected  SARS-COV-2, NAA 2 DAY TAT  Result Value Ref Range   SARS-CoV-2, NAA 2 DAY TAT Performed   CBC with Differential/Platelet  Result Value Ref Range   WBC 6.9 3.4 - 10.8 x10E3/uL   RBC 4.23 4.14 - 5.80 x10E6/uL   Hemoglobin 14.8 13.0 - 17.7 g/dL   Hematocrit 42.7 37.5 - 51.0 %   MCV 101 (H) 79 - 97 fL   MCH 35.0 (H) 26.6 - 33.0 pg   MCHC 34.7 31.5 - 35.7 g/dL   RDW 11.7 11.6 - 15.4 %   Platelets 287 150 - 450 x10E3/uL   Neutrophils 66 Not Estab. %   Lymphs 25 Not Estab. %   Monocytes 8 Not Estab. %   Eos 1 Not Estab. %   Basos 0 Not Estab. %   Neutrophils Absolute 4.5 1.4 - 7.0 x10E3/uL   Lymphocytes Absolute 1.7 0.7 - 3.1 x10E3/uL   Monocytes Absolute 0.6 0.1 - 0.9 x10E3/uL   EOS (ABSOLUTE) 0.1 0.0 - 0.4 x10E3/uL   Basophils Absolute 0.0 0.0 - 0.2 x10E3/uL   Immature Granulocytes 0 Not Estab. %   Immature Grans (Abs) 0.0 0.0 - 0.1 x10E3/uL  Comprehensive metabolic panel  Result Value Ref Range   Glucose 104 (H) 65 - 99 mg/dL   BUN 6 6 - 24 mg/dL   Creatinine, Ser 0.66 (L) 0.76 - 1.27 mg/dL   GFR calc non Af Amer 114 >59 mL/min/1.73   GFR calc Af Amer 132 >59 mL/min/1.73   BUN/Creatinine Ratio 9 9 - 20   Sodium 138 134 - 144 mmol/L   Potassium 4.4 3.5 - 5.2 mmol/L   Chloride 104 96 - 106 mmol/L   CO2 19 (L) 20 - 29 mmol/L   Calcium 9.4 8.7 - 10.2 mg/dL   Total Protein 6.7 6.0 - 8.5 g/dL   Albumin 4.3 4.0 - 5.0 g/dL   Globulin, Total 2.4 1.5 - 4.5 g/dL   Albumin/Globulin Ratio 1.8 1.2 - 2.2   Bilirubin Total 0.2 0.0 - 1.2 mg/dL   Alkaline  Phosphatase 58 44 - 121 IU/L   AST 22 0 - 40 IU/L   ALT 22 0 - 44 IU/L  Lipid Panel w/o Chol/HDL Ratio  Result Value Ref Range   Cholesterol, Total 211 (H) 100 - 199 mg/dL   Triglycerides 138 0 - 149 mg/dL   HDL 46 >39 mg/dL   VLDL Cholesterol Cal 25 5 - 40 mg/dL   LDL Chol Calc (NIH) 140 (H) 0 - 99 mg/dL  Microalbumin, Urine Waived  Result Value Ref Range   Microalb, Ur Waived 10 0 - 19 mg/L   Creatinine, Urine Waived 10 10 - 300 mg/dL   Microalb/Creat Ratio <30 <30 mg/g  PSA  Result Value Ref Range   Prostate Specific Ag, Serum 0.8 0.0 - 4.0 ng/mL  TSH  Result Value Ref Range   TSH 0.661 0.450 - 4.500 uIU/mL  Urinalysis, Routine w reflex microscopic  Result Value Ref Range   Specific Gravity, UA 1.010 1.005 - 1.030   pH, UA 6.5 5.0 - 7.5   Color, UA Yellow Yellow   Appearance Ur Clear Clear   Leukocytes,UA Negative Negative   Protein,UA Negative Negative/Trace   Glucose, UA Negative Negative   Ketones, UA Negative Negative   RBC, UA Negative Negative   Bilirubin, UA Negative Negative   Urobilinogen, Ur 0.2 0.2 - 1.0 mg/dL   Nitrite, UA Negative Negative  Hepatitis C Antibody  Result Value Ref Range   Hep C Virus Ab <0.1 0.0 - 0.9 s/co ratio      Assessment & Plan:   Problem List Items Addressed This Visit   None   Visit Diagnoses    Lateral epicondylitis of left elbow    -  Primary   Getting worse, not better. Will get him into ortho. Referral generated today.   Relevant Medications   cyclobenzaprine (FLEXERIL) 10 MG tablet   naproxen (NAPROSYN) 500 MG tablet   Other Relevant Orders   Ambulatory referral to Orthopedic Surgery   Torticollis, acquired       Will treat with muscle realxer and naproxen and stretches. If not better in the next week, let us know and we'll get x-rays.        Follow up plan: Return if symptoms worsen or fail to improve.

## 2020-09-25 NOTE — Patient Instructions (Addendum)

## 2020-09-26 ENCOUNTER — Encounter: Payer: Self-pay | Admitting: Family Medicine

## 2020-10-11 ENCOUNTER — Telehealth: Payer: Self-pay | Admitting: Family Medicine

## 2020-10-11 NOTE — Telephone Encounter (Signed)
Pt called needing a refill on his Flonase but he said it cost $50 to get it refilled.  He said Dr Wynetta Emery told him she would send a letter to ins so they will cover, He also stated he needs a refill on his Gabapentin  300mg  tid.  Pharmacy is [facility]  CB# [phone]

## 2020-10-12 NOTE — Telephone Encounter (Signed)
Routing to provider  

## 2020-10-13 MED ORDER — GABAPENTIN 300 MG PO CAPS: ORAL_CAPSULE | ORAL | 1 refills | Status: DC

## 2020-10-13 NOTE — Telephone Encounter (Signed)
Its an OTC med, so I'm not sure if his insurance will cover it. I'm not sure if I can write a letter, but there's one in the chart that may help.

## 2020-10-15 NOTE — Telephone Encounter (Signed)
Message sent via my chart

## 2020-10-16 ENCOUNTER — Other Ambulatory Visit: Payer: Self-pay | Admitting: Family Medicine

## 2020-11-26 ENCOUNTER — Other Ambulatory Visit: Payer: Self-pay | Admitting: Family Medicine

## 2020-11-26 NOTE — Telephone Encounter (Signed)
Requested medication (s) are due for refill today: no  Requested medication (s) are on the active medication list:  yes  Last refill:  09/25/2020  Future visit scheduled: yes  Notes to clinic:  this refill cannot be delegated    Requested Prescriptions  Pending Prescriptions Disp Refills   cyclobenzaprine (FLEXERIL) 10 MG tablet [Pharmacy Med Name: CYCLOBENZAPRINE 10 MG TABLET] 30 tablet 0    Sig: TAKE 1 TABLET BY MOUTH EVERYDAY AT BEDTIME      Not Delegated - Analgesics:  Muscle Relaxants Failed - 11/26/2020  3:09 PM      Failed - This refill cannot be delegated      Passed - Valid encounter within last 6 months    Recent Outpatient Visits           2 months ago Lateral epicondylitis of left elbow   [place], Megan P, DO   4 months ago Lateral epicondylitis of left elbow   [place], Megan P, DO   4 months ago Routine general medical examination at a health care facility   [facility], [place], DO   10 months ago Essential hypertension   [place], [place], DO   11 months ago Non-recurrent unilateral inguinal hernia without obstruction or gangrene   [facility] Eulogio Bear, NP       Future Appointments             In 1 month Ralene Bathe, MD [place]   In 1 month Kep'el, Barb Merino, DO [facility], [place]

## 2020-11-26 NOTE — Telephone Encounter (Signed)
Pt has apt on 01/08/21

## 2021-01-03 ENCOUNTER — Other Ambulatory Visit: Payer: Self-pay

## 2021-01-03 ENCOUNTER — Ambulatory Visit: Payer: BC Managed Care – PPO | Admitting: Dermatology

## 2021-01-03 DIAGNOSIS — D485 Neoplasm of uncertain behavior of skin: Secondary | ICD-10-CM

## 2021-01-03 DIAGNOSIS — D2222 Melanocytic nevi of left ear and external auricular canal: Secondary | ICD-10-CM | POA: Diagnosis not present

## 2021-01-03 DIAGNOSIS — L578 Other skin changes due to chronic exposure to nonionizing radiation: Secondary | ICD-10-CM | POA: Diagnosis not present

## 2021-01-03 DIAGNOSIS — Z86018 Personal history of other benign neoplasm: Secondary | ICD-10-CM

## 2021-01-03 DIAGNOSIS — B079 Viral wart, unspecified: Secondary | ICD-10-CM

## 2021-01-03 HISTORY — DX: Personal history of other benign neoplasm: Z86.018

## 2021-01-03 NOTE — Progress Notes (Signed)
New Patient Visit  Subjective  Edward Mckenzie is a 50 y.o. male who presents for the following: Other (Spot on back of left ear that has gotten bigger over the years.) and Warts (Right elbow/).  Referred by Park Liter, DO  The following portions of the chart were reviewed this encounter and updated as appropriate:   Tobacco  Allergies  Meds  Problems  Med Hx  Surg Hx  Fam Hx     Review of Systems:  No other skin or systemic complaints except as noted in HPI or Assessment and Plan.  Objective  Well appearing patient in no apparent distress; mood and affect are within normal limits.  A focused examination was performed including face, neck, ears, arms. Relevant physical exam findings are noted in the Assessment and Plan.  Objective  Left post ear: 0.6 cm brown macule  Objective  Left lat neck inf: 1.1 cm crusted papule  Objective  Left lat neck sup: 0.7 cm crusted papule  Objective  Right Elbow - Posterior (4): Verrucous papules -- Discussed viral etiology and contagion.    Assessment & Plan    Actinic Damage - chronic, secondary to cumulative UV radiation exposure/sun exposure over time - diffuse scaly erythematous macules with underlying dyspigmentation - Recommend daily broad spectrum sunscreen SPF 30+ to sun-exposed areas, reapply every 2 hours as needed.  - Recommend staying in the shade or wearing long sleeves, sun glasses (UVA+UVB protection) and wide brim hats (4-inch brim around the entire circumference of the hat). - Call for new or changing lesions.   Neoplasm of uncertain behavior of skin (3) Left post ear  Skin / nail biopsy Type of biopsy: tangential   Informed consent: discussed and consent obtained   Timeout: patient name, date of birth, surgical site, and procedure verified   Procedure prep:  Patient was prepped and draped in usual sterile fashion Prep type:  Isopropyl alcohol Anesthesia: the lesion was anesthetized in a  standard fashion   Anesthetic:  1% lidocaine w/ epinephrine 1-100,000 buffered w/ 8.4% NaHCO3 Instrument used: flexible razor blade   Hemostasis achieved with: pressure, aluminum chloride and electrodesiccation   Outcome: patient tolerated procedure well   Post-procedure details: sterile dressing applied and wound care instructions given   Dressing type: bandage and petrolatum    Specimen 1 - Surgical pathology Differential Diagnosis: SK vs Nevus vs dysplastic nevus R/O Ca Check Margins: No 0.6 cm brown macule  Left lat neck inf Epidermal / dermal shaving  Lesion diameter (cm):  1.1 Informed consent: discussed and consent obtained   Timeout: patient name, date of birth, surgical site, and procedure verified   Procedure prep:  Patient was prepped and draped in usual sterile fashion Prep type:  Isopropyl alcohol Anesthesia: the lesion was anesthetized in a standard fashion   Anesthetic:  1% lidocaine w/ epinephrine 1-100,000 buffered w/ 8.4% NaHCO3 Instrument used: flexible razor blade   Hemostasis achieved with: pressure, aluminum chloride and electrodesiccation   Outcome: patient tolerated procedure well   Post-procedure details: sterile dressing applied and wound care instructions given   Dressing type: bandage and petrolatum    Destruction of lesion Complexity: extensive   Destruction method: electrodesiccation and curettage   Informed consent: discussed and consent obtained   Timeout:  patient name, date of birth, surgical site, and procedure verified Procedure prep:  Patient was prepped and draped in usual sterile fashion Prep type:  Isopropyl alcohol Anesthesia: the lesion was anesthetized in a standard fashion  Anesthetic:  1% lidocaine w/ epinephrine 1-100,000 buffered w/ 8.4% NaHCO3 Curettage performed in three different directions: Yes   Electrodesiccation performed over the curetted area: Yes   Lesion length (cm):  1.1 Lesion width (cm):  1.1 Margin per side (cm):   0.2 Final wound size (cm):  1.5 Hemostasis achieved with:  pressure and aluminum chloride Outcome: patient tolerated procedure well with no complications   Post-procedure details: sterile dressing applied and wound care instructions given   Dressing type: bandage and petrolatum    Specimen 2 - Surgical pathology Differential Diagnosis: LSC vs SCC vs other  Check Margins: No 1.1 cm crusted papule EDC today  Left lat neck sup Epidermal / dermal shaving  Lesion diameter (cm):  0.7 Informed consent: discussed and consent obtained   Timeout: patient name, date of birth, surgical site, and procedure verified   Procedure prep:  Patient was prepped and draped in usual sterile fashion Prep type:  Isopropyl alcohol Anesthesia: the lesion was anesthetized in a standard fashion   Anesthetic:  1% lidocaine w/ epinephrine 1-100,000 buffered w/ 8.4% NaHCO3 Instrument used: flexible razor blade   Hemostasis achieved with: pressure, aluminum chloride and electrodesiccation   Outcome: patient tolerated procedure well   Post-procedure details: sterile dressing applied and wound care instructions given   Dressing type: bandage and petrolatum    Destruction of lesion Complexity: extensive   Destruction method: electrodesiccation and curettage   Informed consent: discussed and consent obtained   Timeout:  patient name, date of birth, surgical site, and procedure verified Procedure prep:  Patient was prepped and draped in usual sterile fashion Prep type:  Isopropyl alcohol Anesthesia: the lesion was anesthetized in a standard fashion   Anesthetic:  1% lidocaine w/ epinephrine 1-100,000 buffered w/ 8.4% NaHCO3 Curettage performed in three different directions: Yes   Electrodesiccation performed over the curetted area: Yes   Lesion length (cm):  0.7 Lesion width (cm):  0.7 Margin per side (cm):  0.2 Final wound size (cm):  1.1 Hemostasis achieved with:  pressure and aluminum chloride Outcome:  patient tolerated procedure well with no complications   Post-procedure details: sterile dressing applied and wound care instructions given   Dressing type: bandage and petrolatum    Specimen 3 - Surgical pathology Differential Diagnosis:  LSC vs SCC vs other  Check Margins: No 0.7 cm crusted papule EDC today  Viral warts, unspecified type (4) Right Elbow - Posterior Destruction of lesion - Right Elbow - Posterior Complexity: simple   Destruction method: cryotherapy   Informed consent: discussed and consent obtained   Timeout:  patient name, date of birth, surgical site, and procedure verified Lesion destroyed using liquid nitrogen: Yes   Region frozen until ice ball extended beyond lesion: Yes   Outcome: patient tolerated procedure well with no complications   Post-procedure details: wound care instructions given    Return for 6-8 weeks Follow up warts.  I, Ashok Cordia, CMA, am acting as scribe for Sarina Ser, MD .  Documentation: I have reviewed the above documentation for accuracy and completeness, and I agree with the above.  Sarina Ser, MD

## 2021-01-03 NOTE — Patient Instructions (Signed)
Wound Care Instructions  1. Cleanse wound gently with soap and water once a day then pat dry with clean gauze. Apply a thing coat of Petrolatum (petroleum jelly, "Vaseline") over the wound (unless you have an allergy to this). We recommend that you use a new, sterile tube of Vaseline. Do not pick or remove scabs. Do not remove the yellow or white "healing tissue" from the base of the wound.  2. Cover the wound with fresh, clean, nonstick gauze and secure with paper tape. You may use Band-Aids in place of gauze and tape if the would is small enough, but would recommend trimming much of the tape off as there is often too much. Sometimes Band-Aids can irritate the skin.  3. You should call the office for your biopsy report after 1 week if you have not already been contacted.  4. If you experience any problems, such as abnormal amounts of bleeding, swelling, significant bruising, significant pain, or evidence of infection, please call the office immediately.  5. FOR ADULT SURGERY PATIENTS: If you need something for pain relief you may take 1 extra strength Tylenol (acetaminophen) AND 2 Ibuprofen (200mg  each) together every 4 hours as needed for pain. (do not take these if you are allergic to them or if you have a reason you should not take them.) Typically, you may only need pain medication for 1 to 3 days.    Cryotherapy Aftercare  . Wash gently with soap and water everyday.   Marland Kitchen Apply Vaseline and Band-Aid daily until healed.   If you have any questions or concerns for your doctor, please call our main line at [phone] and press option 4 to reach your doctor's medical assistant. If no one answers, please leave a voicemail as directed and we will return your call as soon as possible. Messages left after 4 pm will be answered the following business day.   You may also send [place] a message via [place]. We typically respond to MyChart messages within 1-2 business days.  For prescription refills,  please ask your pharmacy to contact our office. Our fax number is [phone].  If you have an urgent issue when the clinic is closed that cannot wait until the next business day, you can page your doctor at the number below.    Please note that while we do our best to be available for urgent issues outside of office hours, we are not available 24/7.   If you have an urgent issue and are unable to reach [place], you may choose to seek medical care at your doctor's office, retail clinic, urgent care center, or emergency room.  If you have a medical emergency, please immediately call 911 or go to the emergency department.  Pager Numbers  - Dr. Nehemiah Massed: [phone]  - Dr. Laurence Ferrari: [phone]  - Dr. Nicole Kindred: [phone]  In the event of inclement weather, please call our main line at [phone] for an update on the status of any delays or closures.  Dermatology Medication Tips: Please keep the boxes that topical medications come in in order to help keep track of the instructions about where and how to use these. Pharmacies typically print the medication instructions only on the boxes and not directly on the medication tubes.   If your medication is too expensive, please contact our office at [phone] option 4 or send [place] a message through [place].   We are unable to tell what your co-pay for medications will be in advance as this is different  depending on your insurance coverage. However, we may be able to find a substitute medication at lower cost or fill out paperwork to get insurance to cover a needed medication.   If a prior authorization is required to get your medication covered by your insurance company, please allow [place] 1-2 business days to complete this process.  Drug prices often vary depending on where the prescription is filled and some pharmacies may offer cheaper prices.  The website www.goodrx.com contains coupons for medications through different pharmacies. The prices  here do not account for what the cost may be with help from insurance (it may be cheaper with your insurance), but the website can give you the price if you did not use any insurance.  - You can print the associated coupon and take it with your prescription to the pharmacy.  - You may also stop by our office during regular business hours and pick up a GoodRx coupon card.  - If you need your prescription sent electronically to a different pharmacy, notify our office through [facility] or by phone at [phone] option 4.

## 2021-01-07 ENCOUNTER — Encounter: Payer: Self-pay | Admitting: Dermatology

## 2021-01-08 ENCOUNTER — Encounter: Payer: Self-pay | Admitting: Family Medicine

## 2021-01-08 ENCOUNTER — Other Ambulatory Visit: Payer: Self-pay

## 2021-01-08 ENCOUNTER — Ambulatory Visit: Payer: BC Managed Care – PPO | Admitting: Family Medicine

## 2021-01-08 VITALS — BP 138/82 | HR 105 | Temp 98.2°F | Wt 188.8 lb

## 2021-01-08 DIAGNOSIS — I10 Essential (primary) hypertension: Secondary | ICD-10-CM

## 2021-01-08 DIAGNOSIS — G4700 Insomnia, unspecified: Secondary | ICD-10-CM

## 2021-01-08 MED ORDER — LISINOPRIL 5 MG PO TABS: 5.0000 mg | ORAL_TABLET | Freq: Every day | ORAL | 1 refills | Status: DC

## 2021-01-08 MED ORDER — FLUTICASONE PROPIONATE 50 MCG/ACT NA SUSP: 2.0000 | Freq: Two times a day (BID) | NASAL | 12 refills | Status: DC

## 2021-01-08 MED ORDER — CETIRIZINE HCL 10 MG PO TABS: 10.0000 mg | ORAL_TABLET | Freq: Every day | ORAL | 11 refills | Status: DC

## 2021-01-08 MED ORDER — TRAZODONE HCL 100 MG PO TABS: 50.0000 mg | ORAL_TABLET | Freq: Every evening | ORAL | 1 refills | Status: DC | PRN

## 2021-01-08 MED ORDER — GABAPENTIN 300 MG PO CAPS: ORAL_CAPSULE | ORAL | 1 refills | Status: DC

## 2021-01-08 MED ORDER — NAPROXEN 500 MG PO TABS: 500.0000 mg | ORAL_TABLET | Freq: Two times a day (BID) | ORAL | 0 refills | Status: DC

## 2021-01-08 NOTE — Progress Notes (Signed)
BP 138/82   Pulse (!) 105   Temp 98.2 F (36.8 C)   Wt 188 lb 12.8 oz (85.6 kg)   SpO2 97%   BMI 29.05 kg/m    Subjective:    Patient ID: Edward Mckenzie, male    DOB: [birth year], 50 y.o.   MRN: [id number]  HPI: Edward Mckenzie is a 50 y.o. male  Chief Complaint  Patient presents with  . Hypertension  . Insomnia   HYPERTENSION Hypertension status: controlled  Satisfied with current treatment? yes Duration of hypertension: chronic BP monitoring frequency:  not checking BP medication side effects:  no Medication compliance: excellent compliance Previous BP meds: lisinopril Aspirin: no Recurrent headaches: no Visual changes: no Palpitations: no Dyspnea: no Chest pain: no Lower extremity edema: no Dizzy/lightheaded: no  INSOMNIA Duration: chronic Satisfied with sleep quality: no Difficulty falling asleep: no Difficulty staying asleep: no Waking a few hours after sleep onset: no Early morning awakenings: no Daytime hypersomnolence: no Wakes feeling refreshed: no Good sleep hygiene: no Apnea: no Snoring: no Depressed/anxious mood: no Recent stress: no Restless legs/nocturnal leg cramps: no Chronic pain/arthritis: no History of sleep study: no Treatments attempted: melatonin, uinsom and benadryl   Relevant past medical, surgical, family and social history reviewed and updated as indicated. Interim medical history since our last visit reviewed. Allergies and medications reviewed and updated.  Review of Systems  Constitutional: Negative.   Respiratory: Negative.   Cardiovascular: Negative.   Gastrointestinal: Negative.   Musculoskeletal: Negative.   Neurological: Negative.   Psychiatric/Behavioral: Negative.     Per HPI unless specifically indicated above     Objective:    BP 138/82   Pulse (!) 105   Temp 98.2 F (36.8 C)   Wt 188 lb 12.8 oz (85.6 kg)   SpO2 97%   BMI 29.05 kg/m   Wt Readings from Last 3 Encounters:  01/08/21  188 lb 12.8 oz (85.6 kg)  09/25/20 184 lb 6.4 oz (83.6 kg)  07/17/20 173 lb 3.2 oz (78.6 kg)    Physical Exam Vitals and nursing note reviewed.  Constitutional:      General: He is not in acute distress.    Appearance: Normal appearance. He is not ill-appearing, toxic-appearing or diaphoretic.  HENT:     Head: Normocephalic and atraumatic.     Right Ear: External ear normal.     Left Ear: External ear normal.     Nose: Nose normal.     Mouth/Throat:     Mouth: Mucous membranes are moist.     Pharynx: Oropharynx is clear.  Eyes:     General: No scleral icterus.       Right eye: No discharge.        Left eye: No discharge.     Extraocular Movements: Extraocular movements intact.     Conjunctiva/sclera: Conjunctivae normal.     Pupils: Pupils are equal, round, and reactive to light.  Cardiovascular:     Rate and Rhythm: Normal rate and regular rhythm.     Pulses: Normal pulses.     Heart sounds: Normal heart sounds. No murmur heard. No friction rub. No gallop.   Pulmonary:     Effort: Pulmonary effort is normal. No respiratory distress.     Breath sounds: Normal breath sounds. No stridor. No wheezing, rhonchi or rales.  Chest:     Chest wall: No tenderness.  Musculoskeletal:        General: Normal range of motion.  Cervical back: Normal range of motion and neck supple.  Skin:    General: Skin is warm and dry.     Capillary Refill: Capillary refill takes less than 2 seconds.     Coloration: Skin is not jaundiced or pale.     Findings: No bruising, erythema, lesion or rash.  Neurological:     General: No focal deficit present.     Mental Status: He is alert and oriented to person, place, and time. Mental status is at baseline.  Psychiatric:        Mood and Affect: Mood normal.        Behavior: Behavior normal.        Thought Content: Thought content normal.        Judgment: Judgment normal.     Results for orders placed or performed in visit on 07/10/20  Novel  Coronavirus, NAA ([facility])   Specimen: Saline  Result Value Ref Range   SARS-CoV-2, NAA Not Detected Not Detected  SARS-COV-2, NAA 2 DAY TAT  Result Value Ref Range   SARS-CoV-2, NAA 2 DAY TAT Performed   CBC with Differential/Platelet  Result Value Ref Range   WBC 6.9 3.4 - 10.8 x10E3/uL   RBC 4.23 4.14 - 5.80 x10E6/uL   Hemoglobin 14.8 13.0 - 17.7 g/dL   Hematocrit 42.7 37.5 - 51.0 %   MCV 101 (H) 79 - 97 fL   MCH 35.0 (H) 26.6 - 33.0 pg   MCHC 34.7 31.5 - 35.7 g/dL   RDW 11.7 11.6 - 15.4 %   Platelets 287 150 - 450 x10E3/uL   Neutrophils 66 Not Estab. %   Lymphs 25 Not Estab. %   Monocytes 8 Not Estab. %   Eos 1 Not Estab. %   Basos 0 Not Estab. %   Neutrophils Absolute 4.5 1.4 - 7.0 x10E3/uL   Lymphocytes Absolute 1.7 0.7 - 3.1 x10E3/uL   Monocytes Absolute 0.6 0.1 - 0.9 x10E3/uL   EOS (ABSOLUTE) 0.1 0.0 - 0.4 x10E3/uL   Basophils Absolute 0.0 0.0 - 0.2 x10E3/uL   Immature Granulocytes 0 Not Estab. %   Immature Grans (Abs) 0.0 0.0 - 0.1 x10E3/uL  Comprehensive metabolic panel  Result Value Ref Range   Glucose 104 (H) 65 - 99 mg/dL   BUN 6 6 - 24 mg/dL   Creatinine, Ser 0.66 (L) 0.76 - 1.27 mg/dL   GFR calc non Af Amer 114 >59 mL/min/1.73   GFR calc Af Amer 132 >59 mL/min/1.73   BUN/Creatinine Ratio 9 9 - 20   Sodium 138 134 - 144 mmol/L   Potassium 4.4 3.5 - 5.2 mmol/L   Chloride 104 96 - 106 mmol/L   CO2 19 (L) 20 - 29 mmol/L   Calcium 9.4 8.7 - 10.2 mg/dL   Total Protein 6.7 6.0 - 8.5 g/dL   Albumin 4.3 4.0 - 5.0 g/dL   Globulin, Total 2.4 1.5 - 4.5 g/dL   Albumin/Globulin Ratio 1.8 1.2 - 2.2   Bilirubin Total 0.2 0.0 - 1.2 mg/dL   Alkaline Phosphatase 58 44 - 121 IU/L   AST 22 0 - 40 IU/L   ALT 22 0 - 44 IU/L  Lipid Panel w/o Chol/HDL Ratio  Result Value Ref Range   Cholesterol, Total 211 (H) 100 - 199 mg/dL   Triglycerides 138 0 - 149 mg/dL   HDL 46 >39 mg/dL   VLDL Cholesterol Cal 25 5 - 40 mg/dL   LDL Chol Calc (NIH) 140 (H) 0 - 99 mg/dL  Microalbumin, Urine Waived  Result Value Ref Range   Microalb, Ur Waived 10 0 - 19 mg/L   Creatinine, Urine Waived 10 10 - 300 mg/dL   Microalb/Creat Ratio <30 <30 mg/g  PSA  Result Value Ref Range   Prostate Specific Ag, Serum 0.8 0.0 - 4.0 ng/mL  TSH  Result Value Ref Range   TSH 0.661 0.450 - 4.500 uIU/mL  Urinalysis, Routine w reflex microscopic  Result Value Ref Range   Specific Gravity, UA 1.010 1.005 - 1.030   pH, UA 6.5 5.0 - 7.5   Color, UA Yellow Yellow   Appearance Ur Clear Clear   Leukocytes,UA Negative Negative   Protein,UA Negative Negative/Trace   Glucose, UA Negative Negative   Ketones, UA Negative Negative   RBC, UA Negative Negative   Bilirubin, UA Negative Negative   Urobilinogen, Ur 0.2 0.2 - 1.0 mg/dL   Nitrite, UA Negative Negative  Hepatitis C Antibody  Result Value Ref Range   Hep C Virus Ab <0.1 0.0 - 0.9 s/co ratio      Assessment & Plan:   Problem List Items Addressed This Visit      Cardiovascular and Mediastinum   HTN (hypertension) - Primary    Under good control on current regimen. Continue current regimen. Continue to monitor. Call with any concerns. Refills given. Labs drawn today.        Relevant Medications   lisinopril (ZESTRIL) 5 MG tablet   Other Relevant Orders   Basic metabolic panel     Other   Insomnia    Under good control on current regimen. Continue current regimen. Continue to monitor. Call with any concerns. Refills given. Labs drawn today.            Follow up plan: Return in about 6 months (around 07/10/2021) for physical.

## 2021-01-08 NOTE — Assessment & Plan Note (Signed)
Under good control on current regimen. Continue current regimen. Continue to monitor. Call with any concerns. Refills given. Labs drawn today.   

## 2021-01-09 LAB — BASIC METABOLIC PANEL
BUN/Creatinine Ratio: 13 (ref 9–20)
BUN: 10 mg/dL (ref 6–24)
CO2: 22 mmol/L (ref 20–29)
Calcium: 9.7 mg/dL (ref 8.7–10.2)
Chloride: 100 mmol/L (ref 96–106)
Creatinine, Ser: 0.75 mg/dL — ABNORMAL LOW (ref 0.76–1.27)
Glucose: 110 mg/dL — ABNORMAL HIGH (ref 65–99)
Potassium: 4.6 mmol/L (ref 3.5–5.2)
Sodium: 138 mmol/L (ref 134–144)
eGFR: 111 mL/min/{1.73_m2} (ref >59)

## 2021-01-10 ENCOUNTER — Telehealth: Payer: Self-pay

## 2021-01-10 NOTE — Telephone Encounter (Signed)
-----   Message from Ralene Bathe, MD sent at 01/10/2021  4:48 PM EDT ----- Diagnosis 1. 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 Skin , Left lateral neck inferior PRURIGO NODULARIS 3. Skin , left lateral neck superior PRURIGO NODULARIS, EXCORIATED  1- dysplastic Moderate Recheck next visit - July 2022 2&3 - both benign Prurigo Nodularis = thickening of skin usually from picking; rubbing; scratching. Try to keep fingers off these areas Recheck next visit

## 2021-01-10 NOTE — Telephone Encounter (Signed)
Left message on voicemail to return my call.  

## 2021-01-18 ENCOUNTER — Telehealth: Payer: Self-pay

## 2021-01-18 NOTE — Telephone Encounter (Signed)
Left message for patient to call office for results/hd 

## 2021-01-18 NOTE — Telephone Encounter (Signed)
-----   Message from Ralene Bathe, MD sent at 01/10/2021  4:48 PM EDT ----- Diagnosis 1. 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 Skin , Left lateral neck inferior PRURIGO NODULARIS 3. Skin , left lateral neck superior PRURIGO NODULARIS, EXCORIATED  1- dysplastic Moderate Recheck next visit - July 2022 2&3 - both benign Prurigo Nodularis = thickening of skin usually from picking; rubbing; scratching. Try to keep fingers off these areas Recheck next visit

## 2021-02-05 ENCOUNTER — Other Ambulatory Visit: Payer: Self-pay | Admitting: Family Medicine

## 2021-02-06 NOTE — Telephone Encounter (Signed)
Pt had apt on 01/08/2021 next on 07/16/21

## 2021-02-06 NOTE — Telephone Encounter (Signed)
Requested medication (s) are due for refill today: no  Requested medication (s) are on the active medication list: yes   Last refill:  11/26/2020  Future visit scheduled: yes   Notes to clinic:  this refill cannot be delegated    Requested Prescriptions  Pending Prescriptions Disp Refills   cyclobenzaprine (FLEXERIL) 10 MG tablet [Pharmacy Med Name: CYCLOBENZAPRINE 10 MG TABLET] 30 tablet 0    Sig: TAKE 1 TABLET BY MOUTH EVERYDAY AT BEDTIME      Not Delegated - Analgesics:  Muscle Relaxants Failed - 02/05/2021  6:54 PM      Failed - This refill cannot be delegated      Passed - Valid encounter within last 6 months    Recent Outpatient Visits           4 weeks ago Primary hypertension   [place], Megan P, DO   4 months ago Lateral epicondylitis of left elbow   [place], Megan P, DO   6 months ago Lateral epicondylitis of left elbow   [place], Megan P, DO   7 months ago Routine general medical examination at a health care facility   [place], [place], DO   1 year ago Essential hypertension   [place], [place], DO       Future Appointments             In 3 weeks Ralene Bathe, MD [place]   In 5 months Wynetta Emery, Barb Merino, DO [facility]

## 2021-02-09 ENCOUNTER — Other Ambulatory Visit: Payer: Self-pay | Admitting: Family Medicine

## 2021-02-09 NOTE — Telephone Encounter (Signed)
Requested Prescriptions  Pending Prescriptions Disp Refills  . naproxen (NAPROSYN) 500 MG tablet [Pharmacy Med Name: NAPROXEN 500 MG TABLET] 60 tablet 0    Sig: TAKE 1 TABLET BY MOUTH 2 TIMES DAILY WITH A MEAL.     Analgesics:  NSAIDS Failed - 02/09/2021  9:13 AM      Failed - Cr in normal range and within 360 days    Creatinine, Ser  Date Value Ref Range Status  01/08/2021 0.75 (L) 0.76 - 1.27 mg/dL Final         Passed - HGB in normal range and within 360 days    Hemoglobin  Date Value Ref Range Status  07/10/2020 14.8 13.0 - 17.7 g/dL Final         Passed - Patient is not pregnant      Passed - Valid encounter within last 12 months    Recent Outpatient Visits          1 month ago Primary hypertension   [place], Megan P, DO   4 months ago Lateral epicondylitis of left elbow   [place], Megan P, DO   6 months ago Lateral epicondylitis of left elbow   [place], Megan P, DO   7 months ago Routine general medical examination at a health care facility   [place], [place], DO   1 year ago Essential hypertension   [place], [place], DO      Future Appointments            In 2 weeks Ralene Bathe, MD [place]   In 5 months Wynetta Emery, Barb Merino, DO [facility]

## 2021-02-17 ENCOUNTER — Other Ambulatory Visit: Payer: Self-pay | Admitting: Family Medicine

## 2021-02-17 NOTE — Telephone Encounter (Signed)
Receipt confirmed by  Pharmacy 02/09/21 9:52 am requested by interface surescripts

## 2021-02-28 ENCOUNTER — Ambulatory Visit: Payer: BC Managed Care – PPO | Admitting: Dermatology

## 2021-03-11 ENCOUNTER — Ambulatory Visit: Payer: Self-pay | Admitting: *Deleted

## 2021-03-11 NOTE — Telephone Encounter (Signed)
Pt reports covid positive, home test yesterday. Reports productive cough, "Greenish at times", Sore throat, headache, dizziness, body aches. States fever of 99.5 after taking Aleve. States "I feel like it's probably higher at times."  Denies any SOB, no wheezing. Pt is not vaccinated. Home care advise given as well  as symptoms that warrant an ED eval.  Reviewed guidelines for self isolation.Pt is interested in oral anti viral meds.  Assured pt NT would route to practice for PCPs review and final disposition.   CB# [phone]

## 2021-03-11 NOTE — Telephone Encounter (Signed)
Please schedule virtual appointment asap.

## 2021-03-11 NOTE — Telephone Encounter (Signed)
Reason for Disposition  [1] COVID-19 diagnosed by positive lab test (e.g., PCR, rapid self-test kit) AND [2] mild symptoms (e.g., cough, fever, others) AND [9] no complications or SOB  Answer Assessment - Initial Assessment Questions 1. COVID-19 DIAGNOSIS: "Who made your COVID-19 diagnosis?" "Was it confirmed by a positive lab test or self-test?" If not diagnosed by a doctor (or NP/PA), ask "Are there lots of cases (community spread) where you live?" Note: See public health department website, if unsure.     Home, yesterday afternoon 2. COVID-19 EXPOSURE: "Was there any known exposure to COVID before the symptoms began?" CDC Definition of close contact: within 6 feet (2 meters) for a total of 15 minutes or more over a 24-hour period.      Unsure 3. ONSET: "When did the COVID-19 symptoms start?"      Thursday 4. WORST SYMPTOM: "What is your worst symptom?" (e.g., cough, fever, shortness of breath, muscle aches)     Congestion 5. COUGH: "Do you have a cough?" If Yes, ask: "How bad is the cough?"       Yes, productive for thick greenish mucous 6. FEVER: "Do you have a fever?" If Yes, ask: "What is your temperature, how was it measured, and when did it start?"     99.5 after Aleve 7. RESPIRATORY STATUS: "Describe your breathing?" (e.g., shortness of breath, wheezing, unable to speak)      WNL 8. BETTER-SAME-WORSE: "Are you getting better, staying the same or getting worse compared to yesterday?"  If getting worse, ask, "In what way?"     worse 9. HIGH RISK DISEASE: "Do you have any chronic medical problems?" (e.g., asthma, heart or lung disease, weak immune system, obesity, etc.)       No 10. VACCINE: "Have you had the COVID-19 vaccine?" If Yes, ask: "Which one, how many shots, when did you get it?"       No 11. BOOSTER: "Have you received your COVID-19 booster?" If Yes, ask: "Which one and when did you get it?"       no  13. OTHER SYMPTOMS: "Do you have any other symptoms?"  (e.g., chills,  fatigue, headache, loss of smell or taste, muscle pain, sore throat)       Sore throat, body aches, headache, dizzy, "Floating" 14. O2 SATURATION MONITOR:  "Do you use an oxygen saturation monitor (pulse oximeter) at home?" If Yes, ask "What is your reading (oxygen level) today?" "What is your usual oxygen saturation reading?" (e.g., 95%)       NA  Protocols used: Coronavirus (COVID-19) Diagnosed or Suspected-A-AH

## 2021-03-12 ENCOUNTER — Telehealth (INDEPENDENT_AMBULATORY_CARE_PROVIDER_SITE_OTHER): Payer: BC Managed Care – PPO | Admitting: Internal Medicine

## 2021-03-12 ENCOUNTER — Encounter: Payer: Self-pay | Admitting: Internal Medicine

## 2021-03-12 VITALS — Temp 100.0°F

## 2021-03-12 DIAGNOSIS — U071 COVID-19: Secondary | ICD-10-CM | POA: Diagnosis not present

## 2021-03-12 MED ORDER — CHERATUSSIN AC 100-10 MG/5ML PO SOLN: 5.0000 mL | Freq: Three times a day (TID) | ORAL | 0 refills | Status: DC | PRN

## 2021-03-12 MED ORDER — FEXOFENADINE HCL 180 MG PO TABS: 180.0000 mg | ORAL_TABLET | Freq: Every day | ORAL | 1 refills | Status: DC

## 2021-03-12 MED ORDER — AZITHROMYCIN 250 MG PO TABS: ORAL_TABLET | ORAL | 0 refills | Status: AC

## 2021-03-12 MED ORDER — BENZONATATE 100 MG PO CAPS
100.0000 mg | ORAL_CAPSULE | Freq: Three times a day (TID) | ORAL | 0 refills | Status: DC | PRN
Start: 2021-03-12 — End: 2021-07-16

## 2021-03-12 NOTE — Telephone Encounter (Signed)
Pt was called and seen virtually this morning 03/12/2021

## 2021-03-12 NOTE — Progress Notes (Addendum)
Temp 100 F (37.8 C) (Oral)    Subjective:    Patient ID: Edward Mckenzie, male    DOB: 03-20-1971, 50 y.o.   MRN: [id number]  Chief Complaint  Patient presents with   Covid Positive    Tested positive Sunday afternoon, symptoms fever, body aches, congestion in head and chest.    This visit was completed via video visit through MyChart due to the restrictions of the COVID-19 pandemic. All issues as above were discussed and addressed. Physical exam was done as above through visual confirmation on video through MyChart. If it was felt that the patient should be evaluated in the office, they were directed there. The patient verbally consented to this visit. Location of the patient: home Location of the provider: work Those involved with this call:  Provider: Charlynne Cousins, MD CMA: Frazier Butt, [place] Desk/Registration: [place] Side  Time spent on call:  10 minutes with patient face to face via video conference. More than 50% of this time was spent in counseling and coordination of care. 10 minutes total spent in review of patient's record and preparation of their chart.   HPI: Edward Mckenzie is a 50 y.o. male  URI  This is a new problem. The current episode started yesterday. The problem has been gradually worsening. The maximum temperature recorded prior to his arrival was 100.4 - 100.9 F. Associated symptoms include congestion, coughing, headaches, sinus pain and a sore throat. Pertinent negatives include no abdominal pain, chest pain, diarrhea, dysuria, ear pain, joint pain, joint swelling, nausea, neck pain, plugged ear sensation, rash, rhinorrhea, sneezing, swollen glands, vomiting or wheezing. Associated symptoms comments: Per pt slept with a headahce and woke up with one hasnt taken tyelnol or ibubrufen for such  No nausea, felt lightheaded yesterday  .   Chief Complaint  Patient presents with   Covid Positive    Tested positive Sunday afternoon, symptoms  fever, body aches, congestion in head and chest.     Relevant past medical, surgical, family and social history reviewed and updated as indicated. Interim medical history since our last visit reviewed. Allergies and medications reviewed and updated.  Review of Systems  HENT:  Positive for congestion, sinus pain and sore throat. Negative for ear pain, rhinorrhea and sneezing.   Respiratory:  Positive for cough. Negative for wheezing.   Cardiovascular:  Negative for chest pain.  Gastrointestinal:  Negative for abdominal pain, diarrhea, nausea and vomiting.  Genitourinary:  Negative for dysuria.  Musculoskeletal:  Negative for joint pain and neck pain.  Skin:  Negative for rash.  Neurological:  Positive for headaches.   Per HPI unless specifically indicated above     Objective:    Temp 100 F (37.8 C) (Oral)   Wt Readings from Last 3 Encounters:  01/08/21 188 lb 12.8 oz (85.6 kg)  09/25/20 184 lb 6.4 oz (83.6 kg)  07/17/20 173 lb 3.2 oz (78.6 kg)    Physical Exam  Unable to peform sec to virtual visit.   Results for orders placed or performed in visit on 00/93/81  Basic metabolic panel  Result Value Ref Range   Glucose 110 (H) 65 - 99 mg/dL   BUN 10 6 - 24 mg/dL   Creatinine, Ser 0.75 (L) 0.76 - 1.27 mg/dL   eGFR 111 >59 mL/min/1.73   BUN/Creatinine Ratio 13 9 - 20   Sodium 138 134 - 144 mmol/L   Potassium 4.6 3.5 - 5.2 mmol/L   Chloride 100 96 - 106  mmol/L   CO2 22 20 - 29 mmol/L   Calcium 9.7 8.7 - 10.2 mg/dL        Current Outpatient Medications:    Ascorbic Acid (VITAMIN C) 100 MG tablet, Take 100 mg by mouth daily., Disp: , Rfl:    cetirizine (ZYRTEC) 10 MG tablet, Take 1 tablet (10 mg total) by mouth daily., Disp: 30 tablet, Rfl: 11   cyclobenzaprine (FLEXERIL) 10 MG tablet, TAKE 1 TABLET BY MOUTH EVERYDAY AT BEDTIME, Disp: 30 tablet, Rfl: 0   fluticasone (FLONASE) 50 MCG/ACT nasal spray, Place 2 sprays into both nostrils in the morning and at bedtime., Disp:  48 g, Rfl: 12   gabapentin (NEURONTIN) 300 MG capsule, TAKE 1 CAPSULE BY MOUTH THREE TIMES A DAY, Disp: 270 capsule, Rfl: 1   lisinopril (ZESTRIL) 5 MG tablet, Take 1 tablet (5 mg total) by mouth daily., Disp: 90 tablet, Rfl: 1   metroNIDAZOLE (METROGEL) 1 % gel, Apply topically daily., Disp: 60 g, Rfl: 12   Multiple Vitamin (ONE-A-DAY MENS PO), Take 1 tablet by mouth daily. , Disp: , Rfl:    Multiple Vitamins-Minerals (ZINC PO), Take 1 tablet by mouth daily. , Disp: , Rfl:    naproxen (NAPROSYN) 500 MG tablet, TAKE 1 TABLET BY MOUTH 2 TIMES DAILY WITH A MEAL., Disp: 60 tablet, Rfl: 0   Omega-3 Fatty Acids (FISH OIL) 1000 MG CAPS, Take 1,000 mg by mouth daily., Disp: , Rfl:    thiamine (VITAMIN B-1) 100 MG tablet, Take 100 mg by mouth daily., Disp: , Rfl:    traZODone (DESYREL) 100 MG tablet, Take 0.5-1 tablets (50-100 mg total) by mouth at bedtime as needed for sleep., Disp: 90 tablet, Rfl: 1   triamcinolone ointment (KENALOG) 0.5 %, Apply 1 application topically 2 (two) times daily., Disp: 30 g, Rfl: 3   VITAMIN D PO, Take 1 tablet by mouth daily. , Disp: , Rfl:     Assessment & Plan:  COVID +ve  COVID : positive : start pt on zpak , take cheratussin for cough q pm and tessalon during the day for ac coughing spells. Increase fluid intake.  Headahce - tyelnol every 4-6 hrs prn and alternate this with ibubrufen 800 mg q 8 hrly. Sinus pressure: use steam inhalation.  5 days quarantine.  Ok to rtw in 5 days if tests -ve follow  Pt verbalized understanding of such  Problem List Items Addressed This Visit   None    No orders of the defined types were placed in this encounter.    No orders of the defined types were placed in this encounter.    Follow up plan: No follow-ups on file.

## 2021-03-22 ENCOUNTER — Other Ambulatory Visit: Payer: Self-pay | Admitting: Family Medicine

## 2021-03-22 NOTE — Telephone Encounter (Signed)
Requested medications are due for refill today yes  Requested medications are on the active medication list yes  Last refill 7/2  Last visit 12/2020  Future visit scheduled 07/2021  Notes to clinic Unsure if Naprosyn is to be continuous. Flexeril is Not Delegated. Singular looks like no longer on active med list.

## 2021-04-21 ENCOUNTER — Other Ambulatory Visit: Payer: Self-pay | Admitting: Family Medicine

## 2021-04-21 NOTE — Telephone Encounter (Signed)
Requested medication (s) are due for refill today: yes  Requested medication (s) are on the active medication list: yes  Last refill:  03/25/21 #30  Future visit scheduled: no  Notes to clinic:  Med not delegated to NT to RF   Requested Prescriptions  Pending Prescriptions Disp Refills   cyclobenzaprine (FLEXERIL) 10 MG tablet [Pharmacy Med Name: CYCLOBENZAPRINE 10 MG TABLET] 30 tablet     Sig: TAKE 1 TABLET BY MOUTH EVERYDAY AT BEDTIME     Not Delegated - Analgesics:  Muscle Relaxants Failed - 04/21/2021 12:52 PM      Failed - This refill cannot be delegated      Passed - Valid encounter within last 6 months    Recent Outpatient Visits           1 month ago [place], Avant[place], MD   3 months ago Primary hypertension   [place], Megan P, DO   6 months ago Lateral epicondylitis of left elbow   [place], Megan P, DO   9 months ago Lateral epicondylitis of left elbow   [place], Megan P, DO   9 months ago Routine general medical examination at a health care facility   [place], [place], DO       Future Appointments             In 2 weeks Ralene Bathe, MD [place]   In 2 months [place], Megan P, DO [place], [facility]            Signed Prescriptions Disp Refills   naproxen (NAPROSYN) 500 MG tablet 60 tablet 0    Sig: TAKE 1 TABLET BY MOUTH 2 TIMES DAILY WITH A MEAL.     Analgesics:  NSAIDS Failed - 04/21/2021 12:52 PM      Failed - Cr in normal range and within 360 days    Creatinine, Ser  Date Value Ref Range Status  01/08/2021 0.75 (L) 0.76 - 1.27 mg/dL Final          Passed - HGB in normal range and within 360 days    Hemoglobin  Date Value Ref Range Status  07/10/2020 14.8 13.0 - 17.7 g/dL Final          Passed - Patient is not pregnant      Passed - Valid encounter within last 12 months    Recent  Outpatient Visits           1 month ago [place], Avanti, MD   3 months ago Primary hypertension   [place], Megan P, DO   6 months ago Lateral epicondylitis of left elbow   [place], Megan P, DO   9 months ago Lateral epicondylitis of left elbow   [place], Megan P, DO   9 months ago Routine general medical examination at a health care facility   [place], Barb Merino, DO       Future Appointments             In 2 weeks Ralene Bathe, MD [place]   In 2 months [place] Emery, Barb Merino, DO [facility], [facility]

## 2021-04-21 NOTE — Telephone Encounter (Signed)
Requested Prescriptions  Pending Prescriptions Disp Refills  . cyclobenzaprine (FLEXERIL) 10 MG tablet [Pharmacy Med Name: CYCLOBENZAPRINE 10 MG TABLET] 30 tablet     Sig: TAKE 1 TABLET BY MOUTH EVERYDAY AT BEDTIME     Not Delegated - Analgesics:  Muscle Relaxants Failed - 04/21/2021 12:52 PM      Failed - This refill cannot be delegated      Passed - Valid encounter within last 6 months    Recent Outpatient Visits          1 month ago [place], MD   3 months ago Primary hypertension   [employer], Megan P, DO   6 months ago Lateral epicondylitis of left elbow   [place], Megan P, DO   9 months ago Lateral epicondylitis of left elbow   [place], Megan P, DO   9 months ago Routine general medical examination at a health care facility   [place], [place], DO      Future Appointments            In 2 weeks Ralene Bathe, MD [place]   In 2 months [place], Megan P, DO [place], [facility]           . naproxen (NAPROSYN) 500 MG tablet [Pharmacy Med Name: NAPROXEN 500 MG TABLET] 60 tablet 0    Sig: TAKE 1 TABLET BY MOUTH 2 TIMES DAILY WITH A MEAL.     Analgesics:  NSAIDS Failed - 04/21/2021 12:52 PM      Failed - Cr in normal range and within 360 days    Creatinine, Ser  Date Value Ref Range Status  01/08/2021 0.75 (L) 0.76 - 1.27 mg/dL Final         Passed - HGB in normal range and within 360 days    Hemoglobin  Date Value Ref Range Status  07/10/2020 14.8 13.0 - 17.7 g/dL Final         Passed - Patient is not pregnant      Passed - Valid encounter within last 12 months    Recent Outpatient Visits          1 month ago [place], Avanti, MD   3 months ago Primary hypertension   [place], Megan P, DO   6 months ago Lateral epicondylitis of left elbow    [place], Megan P, DO   9 months ago Lateral epicondylitis of left elbow   [place], Megan P, DO   9 months ago Routine general medical examination at a health care facility   [place], Barb Merino, DO      Future Appointments            In 2 weeks Ralene Bathe, MD [place]   In 2 months Wynetta Emery, Barb Merino, DO [facility], [facility]

## 2021-05-09 ENCOUNTER — Ambulatory Visit: Payer: BC Managed Care – PPO | Admitting: Dermatology

## 2021-05-21 ENCOUNTER — Other Ambulatory Visit: Payer: Self-pay | Admitting: Family Medicine

## 2021-07-16 ENCOUNTER — Encounter: Payer: Self-pay | Admitting: Family Medicine

## 2021-07-16 ENCOUNTER — Ambulatory Visit (INDEPENDENT_AMBULATORY_CARE_PROVIDER_SITE_OTHER): Payer: BC Managed Care – PPO | Admitting: Family Medicine

## 2021-07-16 ENCOUNTER — Other Ambulatory Visit: Payer: Self-pay

## 2021-07-16 VITALS — BP 137/78 | HR 69 | Temp 99.3°F | Ht 68.8 in | Wt 190.4 lb

## 2021-07-16 DIAGNOSIS — Z0000 Encounter for general adult medical examination without abnormal findings: Secondary | ICD-10-CM | POA: Diagnosis not present

## 2021-07-16 DIAGNOSIS — I10 Essential (primary) hypertension: Secondary | ICD-10-CM

## 2021-07-16 LAB — URINALYSIS, ROUTINE W REFLEX MICROSCOPIC
Bilirubin, UA: NEGATIVE
Glucose, UA: NEGATIVE
Ketones, UA: NEGATIVE
Leukocytes,UA: NEGATIVE
Nitrite, UA: NEGATIVE
Protein,UA: NEGATIVE
RBC, UA: NEGATIVE
Specific Gravity, UA: 1.01 (ref 1.005–1.030)
Urobilinogen, Ur: 0.2 mg/dL (ref 0.2–1.0)
pH, UA: 7.5 (ref 5.0–7.5)

## 2021-07-16 LAB — MICROALBUMIN, URINE WAIVED
Creatinine, Urine Waived: 50 mg/dL (ref 10–300)
Microalb, Ur Waived: 10 mg/L (ref 0–19)
Microalb/Creat Ratio: <30 mg/g (ref <30)

## 2021-07-16 MED ORDER — LISINOPRIL 5 MG PO TABS: 5.0000 mg | ORAL_TABLET | Freq: Every day | ORAL | 1 refills | Status: DC

## 2021-07-16 MED ORDER — TRAZODONE HCL 100 MG PO TABS: 50.0000 mg | ORAL_TABLET | Freq: Every evening | ORAL | 1 refills | Status: DC | PRN

## 2021-07-16 MED ORDER — GABAPENTIN 300 MG PO CAPS: ORAL_CAPSULE | ORAL | 1 refills | Status: DC

## 2021-07-16 NOTE — Assessment & Plan Note (Signed)
Under good control on current regimen. Continue current regimen. Continue to monitor. Call with any concerns. Refills given. Labs drawn today.   

## 2021-07-16 NOTE — Progress Notes (Signed)
BP 137/78 (BP Location: Left Arm, Cuff Size: Normal)   Pulse 69   Temp 99.3 F (37.4 C) (Oral)   Ht 5' 8.8" (1.748 m)   Wt 190 lb 6.4 oz (86.4 kg)   SpO2 97%   BMI 28.28 kg/m    Subjective:    Patient ID: Edward Mckenzie, male    DOB: 1971-08-05, 50 y.o.   MRN: [id number]  HPI: Edward Mckenzie is a 50 y.o. male presenting on 07/16/2021 for comprehensive medical examination. Current medical complaints include:  HYPERTENSION Hypertension status: controlled  Satisfied with current treatment? yes Duration of hypertension: chronic BP monitoring frequency:  not checking BP medication side effects:  no Medication compliance: excellent compliance Previous BP meds: lisinopril Aspirin: no Recurrent headaches: no Visual changes: no Palpitations: no Dyspnea: no Chest pain: no Lower extremity edema: no Dizzy/lightheaded: no  Interim Problems from his last visit: no  Depression Screen done today and results listed below:  Depression screen [facility] 2/9 07/16/2021 03/12/2021 07/10/2020 07/05/2019 05/25/2018  Decreased Interest 0 0 0 0 0  Down, Depressed, Hopeless 0 0 0 0 0  PHQ - 2 Score 0 0 0 0 0  Altered sleeping 0 - - 0 2  Tired, decreased energy 1 - - 0 0  Change in appetite 0 - - 0 0  Feeling bad or failure about yourself  0 - - 0 0  Trouble concentrating 0 - - 0 0  Moving slowly or fidgety/restless 0 - - 0 0  Suicidal thoughts 0 - - 0 0  PHQ-9 Score 1 - - 0 2  Difficult doing work/chores Not difficult at all - - Not difficult at all Somewhat difficult    Past Medical History:  Past Medical History:  Diagnosis Date   Carpal tunnel syndrome of right wrist    History of dysplastic nevus 01/03/2021   left posterior ear, moderate atypia   Hypertension    Jones fracture    left foot   Wears contact lenses     Surgical History:  Past Surgical History:  Procedure Laterality Date   BACK SURGERY     BREAST BIOPSY Left 06/22/2018   [place] bx BENIGN FIBROFATTY  TISSUE WITH DEGENERATIVE CHANGES   HERNIA REPAIR     KNEE SURGERY     NASAL SINUS SURGERY  02/22/98   UPPP, tonguepexy, septo.  Dr. Kathyrn Sheriff. [facility]   ORIF TOE FRACTURE Left 12/20/2015   Procedure: OPEN REDUCTION INTERNAL FIXATION (ORIF) 5TH METATARSAL (TOE) FRACTURE LEFT;  Surgeon: Albertine Patricia, DPM;  Location: [facility];  Service: Podiatry;  Laterality: Left;  LMA WITH POPLITEAL   TONSILLECTOMY AND ADENOIDECTOMY     UVULOPALATOPHARYNGOPLASTY     XI ROBOTIC ASSISTED INGUINAL HERNIA REPAIR WITH MESH Bilateral 01/06/2020   Procedure: XI ROBOTIC ASSISTED INGUINAL HERNIA REPAIR WITH MESH;  Surgeon: Jules Husbands, MD;  Location: [facility];  Service: General;  Laterality: Bilateral;    Medications:  Current Outpatient Medications on File Prior to Visit  Medication Sig   Ascorbic Acid (VITAMIN C) 100 MG tablet Take 100 mg by mouth daily.   cyclobenzaprine (FLEXERIL) 10 MG tablet Take 1 tablet (10 mg total) by mouth at bedtime as needed for muscle spasms.   fexofenadine (ALLEGRA ALLERGY) 180 MG tablet Take 1 tablet (180 mg total) by mouth daily.   metroNIDAZOLE (METROGEL) 1 % gel Apply topically daily.   Multiple Vitamin (ONE-A-DAY MENS PO) Take 1 tablet by mouth daily.    Multiple Vitamins-Minerals (ZINC  PO) Take 1 tablet by mouth daily.    naproxen (NAPROSYN) 500 MG tablet TAKE 1 TABLET BY MOUTH 2 TIMES DAILY WITH A MEAL.   Omega-3 Fatty Acids (FISH OIL) 1000 MG CAPS Take 1,000 mg by mouth daily.   thiamine (VITAMIN B-1) 100 MG tablet Take 100 mg by mouth daily.   triamcinolone ointment (KENALOG) 0.5 % Apply 1 application topically 2 (two) times daily.   VITAMIN D PO Take 1 tablet by mouth daily.    No current facility-administered medications on file prior to visit.    Allergies:  No Known Allergies  Social History:  Social History   Socioeconomic History   Marital status: Single    Spouse name: Not on file   Number of children: Not on file   Years of education: Not on  file   Highest education level: Not on file  Occupational History   Not on file  Tobacco Use   Smoking status: Every Day    Packs/day: 1.50    Years: 15.00    Pack years: 22.50    Types: Cigarettes   Smokeless tobacco: Former  Scientific laboratory technician Use: Never used  Substance and Sexual Activity   Alcohol use: Not Currently    Alcohol/week: 0.0 standard drinks   Drug use: No   Sexual activity: Yes  Other Topics Concern   Not on file  Social History Narrative   Not on file   Social Determinants of Health   Financial Resource Strain: Not on file  Food Insecurity: Not on file  Transportation Needs: Not on file  Physical Activity: Not on file  Stress: Not on file  Social Connections: Not on file  Intimate Partner Violence: Not on file   Social History   Tobacco Use  Smoking Status Every Day   Packs/day: 1.50   Years: 15.00   Pack years: 22.50   Types: Cigarettes  Smokeless Tobacco Former   Social History   Substance and Sexual Activity  Alcohol Use Not Currently   Alcohol/week: 0.0 standard drinks    Family History:  Family History  Problem Relation Age of Onset   Heart disease Father    Diabetes Paternal Grandfather    Heart disease Paternal Grandfather    Cancer Paternal Grandfather        unknown location   Breast cancer Neg Hx    Colon cancer Neg Hx     Past medical history, surgical history, medications, allergies, family history and social history reviewed with patient today and changes made to appropriate areas of the chart.   Review of Systems  Constitutional: Negative.   HENT: Negative.    Eyes: Negative.   Respiratory: Negative.    Cardiovascular: Negative.   Gastrointestinal: Negative.   Genitourinary: Negative.   Musculoskeletal: Negative.   Skin: Negative.   Neurological:  Positive for tingling (R hand). Negative for dizziness, tremors, sensory change, speech change, focal weakness, seizures, loss of consciousness, weakness and  headaches.  Endo/Heme/Allergies: Negative.   Psychiatric/Behavioral: Negative.    All other ROS negative except what is listed above and in the HPI.      Objective:    BP 137/78 (BP Location: Left Arm, Cuff Size: Normal)   Pulse 69   Temp 99.3 F (37.4 C) (Oral)   Ht 5' 8.8" (1.748 m)   Wt 190 lb 6.4 oz (86.4 kg)   SpO2 97%   BMI 28.28 kg/m   Wt Readings from Last 3 Encounters:  07/16/21 190  lb 6.4 oz (86.4 kg)  01/08/21 188 lb 12.8 oz (85.6 kg)  09/25/20 184 lb 6.4 oz (83.6 kg)    Physical Exam Vitals and nursing note reviewed.  Constitutional:      General: He is not in acute distress.    Appearance: Normal appearance. He is normal weight. He is not ill-appearing, toxic-appearing or diaphoretic.  HENT:     Head: Normocephalic and atraumatic.     Right Ear: Tympanic membrane, ear canal and external ear normal. There is no impacted cerumen.     Left Ear: Tympanic membrane, ear canal and external ear normal. There is no impacted cerumen.     Nose: Nose normal. No congestion or rhinorrhea.     Mouth/Throat:     Mouth: Mucous membranes are moist.     Pharynx: Oropharynx is clear. No oropharyngeal exudate or posterior oropharyngeal erythema.  Eyes:     General: No scleral icterus.       Right eye: No discharge.        Left eye: No discharge.     Extraocular Movements: Extraocular movements intact.     Conjunctiva/sclera: Conjunctivae normal.     Pupils: Pupils are equal, round, and reactive to light.  Neck:     Vascular: No carotid bruit.  Cardiovascular:     Rate and Rhythm: Normal rate and regular rhythm.     Pulses: Normal pulses.     Heart sounds: No murmur heard.   No friction rub. No gallop.  Pulmonary:     Effort: Pulmonary effort is normal. No respiratory distress.     Breath sounds: Normal breath sounds. No stridor. No wheezing, rhonchi or rales.  Chest:     Chest wall: No tenderness.  Abdominal:     General: Abdomen is flat. Bowel sounds are normal. There  is no distension.     Palpations: Abdomen is soft. There is no mass.     Tenderness: There is no abdominal tenderness. There is no right CVA tenderness, left CVA tenderness, guarding or rebound.     Hernia: No hernia is present.  Genitourinary:    Comments: Genital exam deferred with shared decision making Musculoskeletal:        General: No swelling, tenderness, deformity or signs of injury.     Cervical back: Normal range of motion and neck supple. No rigidity. No muscular tenderness.     Right lower leg: No edema.     Left lower leg: No edema.  Lymphadenopathy:     Cervical: No cervical adenopathy.  Skin:    General: Skin is warm and dry.     Capillary Refill: Capillary refill takes less than 2 seconds.     Coloration: Skin is not jaundiced or pale.     Findings: No bruising, erythema, lesion or rash.  Neurological:     General: No focal deficit present.     Mental Status: He is alert and oriented to person, place, and time.     Cranial Nerves: No cranial nerve deficit.     Sensory: No sensory deficit.     Motor: No weakness.     Coordination: Coordination normal.     Gait: Gait normal.     Deep Tendon Reflexes: Reflexes normal.  Psychiatric:        Mood and Affect: Mood normal.        Behavior: Behavior normal.        Thought Content: Thought content normal.        Judgment:  Judgment normal.    Results for orders placed or performed in visit on 68/03/21  Basic metabolic panel  Result Value Ref Range   Glucose 110 (H) 65 - 99 mg/dL   BUN 10 6 - 24 mg/dL   Creatinine, Ser 0.75 (L) 0.76 - 1.27 mg/dL   eGFR 111 >59 mL/min/1.73   BUN/Creatinine Ratio 13 9 - 20   Sodium 138 134 - 144 mmol/L   Potassium 4.6 3.5 - 5.2 mmol/L   Chloride 100 96 - 106 mmol/L   CO2 22 20 - 29 mmol/L   Calcium 9.7 8.7 - 10.2 mg/dL      Assessment & Plan:   Problem List Items Addressed This Visit       Cardiovascular and Mediastinum   HTN (hypertension)    Under good control on current  regimen. Continue current regimen. Continue to monitor. Call with any concerns. Refills given. Labs drawn today.       Relevant Medications   lisinopril (ZESTRIL) 5 MG tablet   Other Relevant Orders   Microalbumin, Urine Waived   Other Visit Diagnoses     Routine general medical examination at a health care facility    -  Primary   Vaccines up to date/declined. Screening labs checked today. Cologuard ordered. Continue diet and exercise. Call with any concerns.    Relevant Orders   Comprehensive metabolic panel   CBC with Differential/Platelet   Lipid Panel w/o Chol/HDL Ratio   PSA   TSH   Urinalysis, Routine w reflex microscopic        Discussed aspirin prophylaxis for myocardial infarction prevention and decision was it was not indicated  LABORATORY TESTING:  Health maintenance labs ordered today as discussed above.   The natural history of prostate cancer and ongoing controversy regarding screening and potential treatment outcomes of prostate cancer has been discussed with the patient. The meaning of a false positive PSA and a false negative PSA has been discussed. He indicates understanding of the limitations of this screening test and wishes to proceed with screening PSA testing.   IMMUNIZATIONS:   - Tdap: Tetanus vaccination status reviewed: last tetanus booster within 10 years. - Influenza: Refused - Pneumovax: Up to date - Prevnar: Not applicable - COVID: Refused - HPV: Not applicable - Shingrix vaccine: Not applicable  SCREENING: - Colonoscopy: will do cologuard- has at home  Discussed with patient purpose of the colonoscopy is to detect colon cancer at curable precancerous or early stages    PATIENT COUNSELING:    Sexuality: Discussed sexually transmitted diseases, partner selection, use of condoms, avoidance of unintended pregnancy  and contraceptive alternatives.   Advised to avoid cigarette smoking.  I discussed with the patient that most people either  abstain from alcohol or drink within safe limits (<=14/week and <=4 drinks/occasion for males, <=7/weeks and <= 3 drinks/occasion for females) and that the risk for alcohol disorders and other health effects rises proportionally with the number of drinks per week and how often a drinker exceeds daily limits.  Discussed cessation/primary prevention of drug use and availability of treatment for abuse.   Diet: Encouraged to adjust caloric intake to maintain  or achieve ideal body weight, to reduce intake of dietary saturated fat and total fat, to limit sodium intake by avoiding high sodium foods and not adding table salt, and to maintain adequate dietary potassium and calcium preferably from fresh fruits, vegetables, and low-fat dairy products.    stressed the importance of regular exercise  Injury prevention:  Discussed safety belts, safety helmets, smoke detector, smoking near bedding or upholstery.   Dental health: Discussed importance of regular tooth brushing, flossing, and dental visits.   Follow up plan: NEXT PREVENTATIVE PHYSICAL DUE IN 1 YEAR. Return in about 6 months (around 01/14/2022).

## 2021-07-17 LAB — LIPID PANEL W/O CHOL/HDL RATIO
Cholesterol, Total: 195 mg/dL (ref 100–199)
HDL: 92 mg/dL (ref >39)
LDL Chol Calc (NIH): 93 mg/dL (ref 0–99)
Triglycerides: 50 mg/dL (ref 0–149)
VLDL Cholesterol Cal: 10 mg/dL (ref 5–40)

## 2021-07-17 LAB — CBC WITH DIFFERENTIAL/PLATELET
Basophils Absolute: 0 10*3/uL (ref 0.0–0.2)
Basos: 0 %
EOS (ABSOLUTE): 0 10*3/uL (ref 0.0–0.4)
Eos: 1 %
Hematocrit: 41.9 % (ref 37.5–51.0)
Hemoglobin: 14.1 g/dL (ref 13.0–17.7)
Immature Grans (Abs): 0 10*3/uL (ref 0.0–0.1)
Immature Granulocytes: 0 %
Lymphocytes Absolute: 2 10*3/uL (ref 0.7–3.1)
Lymphs: 35 %
MCH: 34.6 pg — ABNORMAL HIGH (ref 26.6–33.0)
MCHC: 33.7 g/dL (ref 31.5–35.7)
MCV: 103 fL — ABNORMAL HIGH (ref 79–97)
Monocytes Absolute: 0.6 10*3/uL (ref 0.1–0.9)
Monocytes: 10 %
Neutrophils Absolute: 3 10*3/uL (ref 1.4–7.0)
Neutrophils: 54 %
Platelets: 220 10*3/uL (ref 150–450)
RBC: 4.08 x10E6/uL — ABNORMAL LOW (ref 4.14–5.80)
RDW: 12.2 % (ref 11.6–15.4)
WBC: 5.7 10*3/uL (ref 3.4–10.8)

## 2021-07-17 LAB — COMPREHENSIVE METABOLIC PANEL
ALT: 23 IU/L (ref 0–44)
AST: 32 IU/L (ref 0–40)
Albumin/Globulin Ratio: 2.1 (ref 1.2–2.2)
Albumin: 4.7 g/dL (ref 4.0–5.0)
Alkaline Phosphatase: 59 IU/L (ref 44–121)
BUN/Creatinine Ratio: 8 — ABNORMAL LOW (ref 9–20)
BUN: 6 mg/dL (ref 6–24)
Bilirubin Total: 0.3 mg/dL (ref 0.0–1.2)
CO2: 23 mmol/L (ref 20–29)
Calcium: 9.4 mg/dL (ref 8.7–10.2)
Chloride: 99 mmol/L (ref 96–106)
Creatinine, Ser: 0.8 mg/dL (ref 0.76–1.27)
Globulin, Total: 2.2 g/dL (ref 1.5–4.5)
Glucose: 101 mg/dL — ABNORMAL HIGH (ref 70–99)
Potassium: 4.6 mmol/L (ref 3.5–5.2)
Sodium: 137 mmol/L (ref 134–144)
Total Protein: 6.9 g/dL (ref 6.0–8.5)
eGFR: 108 mL/min/{1.73_m2} (ref >59)

## 2021-07-17 LAB — TSH: TSH: 1.03 u[IU]/mL (ref 0.450–4.500)

## 2021-07-17 LAB — PSA: Prostate Specific Ag, Serum: 0.9 ng/mL (ref 0.0–4.0)

## 2021-07-19 ENCOUNTER — Encounter: Payer: Self-pay | Admitting: Family Medicine

## 2021-07-28 ENCOUNTER — Other Ambulatory Visit: Payer: Self-pay | Admitting: Family Medicine

## 2021-07-29 NOTE — Telephone Encounter (Signed)
Requested Prescriptions  Pending Prescriptions Disp Refills   naproxen (NAPROSYN) 500 MG tablet [Pharmacy Med Name: NAPROXEN 500 MG TABLET] 60 tablet 1    Sig: TAKE 1 TABLET BY MOUTH 2 TIMES DAILY WITH A MEAL.     Analgesics:  NSAIDS Passed - 07/28/2021  9:36 AM      Passed - Cr in normal range and within 360 days    Creatinine, Ser  Date Value Ref Range Status  07/16/2021 0.80 0.76 - 1.27 mg/dL Final         Passed - HGB in normal range and within 360 days    Hemoglobin  Date Value Ref Range Status  07/16/2021 14.1 13.0 - 17.7 g/dL Final         Passed - Patient is not pregnant      Passed - Valid encounter within last 12 months    Recent Outpatient Visits          1 week ago Routine general medical examination at a health care facility   [facility], [place] P, DO   4 months ago [place], Avanti, MD   6 months ago Primary hypertension   [place], Megan P, DO   10 months ago Lateral epicondylitis of left elbow   [place], Megan P, DO   1 year ago Lateral epicondylitis of left elbow   [place], [place], DO      Future Appointments            In 5 months Wynetta Emery, Barb Merino, DO [facility], [facility]

## 2021-11-14 ENCOUNTER — Other Ambulatory Visit: Payer: Self-pay | Admitting: Family Medicine

## 2021-11-14 NOTE — Telephone Encounter (Signed)
Requested Prescriptions  ?Pending Prescriptions Disp Refills  ?? traZODone (DESYREL) 100 MG tablet [Pharmacy Med Name: TRAZODONE 100 MG TABLET] 90 tablet 0  ?  Sig: TAKE 1/2 TO 1 TABLET (50-100 MG TOTAL) BY MOUTH AT BEDTIME AS NEEDED FOR SLEEP  ?  ? Psychiatry: Antidepressants - Serotonin Modulator Passed - 11/14/2021  2:22 AM  ?  ?  Passed - Valid encounter within last 6 months  ?  Recent Outpatient Visits   ?      ? 4 months ago Routine general medical examination at a health care facility  ? [place], Megan P, DO  ? 8 months ago COVID-19  ? [id number] [place], Avanti, MD  ? 10 months ago Primary hypertension  ? [place], DO  ? 1 year ago Lateral epicondylitis of left elbow  ? [place], DO  ? 1 year ago Lateral epicondylitis of left elbow  ? [place], [place] P, DO  ?  ?  ?Future Appointments   ?        ? In 2 months Wynetta Emery, Barb Merino, DO [facility], [facility]  ?  ? ?  ?  ?  ? ? ?

## 2021-12-26 ENCOUNTER — Encounter: Payer: Self-pay | Admitting: Family Medicine

## 2022-01-14 ENCOUNTER — Ambulatory Visit: Payer: BC Managed Care – PPO | Admitting: Family Medicine

## 2022-01-28 ENCOUNTER — Ambulatory Visit: Payer: BC Managed Care – PPO | Admitting: Family Medicine

## 2022-01-28 ENCOUNTER — Encounter: Payer: Self-pay | Admitting: Family Medicine

## 2022-01-28 VITALS — BP 129/75 | HR 87 | Temp 98.1°F | Wt 181.0 lb

## 2022-01-28 DIAGNOSIS — Z23 Encounter for immunization: Secondary | ICD-10-CM

## 2022-01-28 DIAGNOSIS — I10 Essential (primary) hypertension: Secondary | ICD-10-CM | POA: Diagnosis not present

## 2022-01-28 DIAGNOSIS — G4700 Insomnia, unspecified: Secondary | ICD-10-CM

## 2022-01-28 MED ORDER — NAPROXEN 500 MG PO TABS: 500.0000 mg | ORAL_TABLET | Freq: Two times a day (BID) | ORAL | 1 refills | Status: DC

## 2022-01-28 MED ORDER — TRAZODONE HCL 100 MG PO TABS: ORAL_TABLET | ORAL | 1 refills | Status: DC

## 2022-01-28 MED ORDER — LISINOPRIL 5 MG PO TABS: 5.0000 mg | ORAL_TABLET | Freq: Every day | ORAL | 1 refills | Status: DC

## 2022-01-28 NOTE — Assessment & Plan Note (Signed)
Under good control on current regimen. Continue current regimen. Continue to monitor. Call with any concerns. Refills given. Labs drawn today.   

## 2022-01-28 NOTE — Assessment & Plan Note (Signed)
Under good control on current regimen. Continue current regimen. Continue to monitor. Call with any concerns. Refills given.   

## 2022-01-28 NOTE — Progress Notes (Signed)
BP 129/75   Pulse 87   Temp 98.1 F (36.7 C)   Wt 181 lb (82.1 kg)   SpO2 100%   BMI 26.88 kg/m    Subjective:    Patient ID: Edward Mckenzie, male    DOB: 30-Jul-1971, 51 y.o.   MRN: [id number]  HPI: Edward Mckenzie is a 51 y.o. male  Chief Complaint  Patient presents with   Hypertension   Insomnia   HYPERTENSION Hypertension status: controlled  Satisfied with current treatment? yes Duration of hypertension: chronic BP monitoring frequency:  not checking BP medication side effects:  no Medication compliance: excellent compliance Previous BP meds:lisinopril Aspirin: no Recurrent headaches: no Visual changes: no Palpitations: no Dyspnea: no Chest pain: no Lower extremity edema: no Dizzy/lightheaded: no  INSOMNIA Duration: chronic Satisfied with sleep quality: yes Difficulty falling asleep: no Difficulty staying asleep: no Waking a few hours after sleep onset: no Early morning awakenings: no Daytime hypersomnolence: no Wakes feeling refreshed: no Good sleep hygiene: no Apnea: no Snoring: no Depressed/anxious mood: no Recent stress: no Restless legs/nocturnal leg cramps: no Chronic pain/arthritis: no History of sleep study: no Treatments attempted:  trazodone, melatonin, uinsom, and benadryl   Relevant past medical, surgical, family and social history reviewed and updated as indicated. Interim medical history since our last visit reviewed. Allergies and medications reviewed and updated.  Review of Systems  Constitutional: Negative.   Respiratory: Negative.    Cardiovascular: Negative.   Gastrointestinal: Negative.   Musculoskeletal: Negative.   Neurological: Negative.   Psychiatric/Behavioral: Negative.      Per HPI unless specifically indicated above     Objective:    BP 129/75   Pulse 87   Temp 98.1 F (36.7 C)   Wt 181 lb (82.1 kg)   SpO2 100%   BMI 26.88 kg/m   Wt Readings from Last 3 Encounters:  01/28/22 181 lb  (82.1 kg)  07/16/21 190 lb 6.4 oz (86.4 kg)  01/08/21 188 lb 12.8 oz (85.6 kg)    Physical Exam Vitals and nursing note reviewed.  Constitutional:      General: He is not in acute distress.    Appearance: Normal appearance. He is not ill-appearing, toxic-appearing or diaphoretic.  HENT:     Head: Normocephalic and atraumatic.     Right Ear: External ear normal.     Left Ear: External ear normal.     Nose: Nose normal.     Mouth/Throat:     Mouth: Mucous membranes are moist.     Pharynx: Oropharynx is clear.  Eyes:     General: No scleral icterus.       Right eye: No discharge.        Left eye: No discharge.     Extraocular Movements: Extraocular movements intact.     Conjunctiva/sclera: Conjunctivae normal.     Pupils: Pupils are equal, round, and reactive to light.  Cardiovascular:     Rate and Rhythm: Normal rate and regular rhythm.     Pulses: Normal pulses.     Heart sounds: Normal heart sounds. No murmur heard.    No friction rub. No gallop.  Pulmonary:     Effort: Pulmonary effort is normal. No respiratory distress.     Breath sounds: Normal breath sounds. No stridor. No wheezing, rhonchi or rales.  Chest:     Chest wall: No tenderness.  Musculoskeletal:        General: Normal range of motion.     Cervical back: Normal  range of motion and neck supple.  Skin:    General: Skin is warm and dry.     Capillary Refill: Capillary refill takes less than 2 seconds.     Coloration: Skin is not jaundiced or pale.     Findings: No bruising, erythema, lesion or rash.  Neurological:     General: No focal deficit present.     Mental Status: He is alert and oriented to person, place, and time. Mental status is at baseline.  Psychiatric:        Mood and Affect: Mood normal.        Behavior: Behavior normal.        Thought Content: Thought content normal.        Judgment: Judgment normal.     Results for orders placed or performed in visit on 07/16/21  Comprehensive  metabolic panel  Result Value Ref Range   Glucose 101 (H) 70 - 99 mg/dL   BUN 6 6 - 24 mg/dL   Creatinine, Ser 0.80 0.76 - 1.27 mg/dL   eGFR 108 >59 mL/min/1.73   BUN/Creatinine Ratio 8 (L) 9 - 20   Sodium 137 134 - 144 mmol/L   Potassium 4.6 3.5 - 5.2 mmol/L   Chloride 99 96 - 106 mmol/L   CO2 23 20 - 29 mmol/L   Calcium 9.4 8.7 - 10.2 mg/dL   Total Protein 6.9 6.0 - 8.5 g/dL   Albumin 4.7 4.0 - 5.0 g/dL   Globulin, Total 2.2 1.5 - 4.5 g/dL   Albumin/Globulin Ratio 2.1 1.2 - 2.2   Bilirubin Total 0.3 0.0 - 1.2 mg/dL   Alkaline Phosphatase 59 44 - 121 IU/L   AST 32 0 - 40 IU/L   ALT 23 0 - 44 IU/L  CBC with Differential/Platelet  Result Value Ref Range   WBC 5.7 3.4 - 10.8 x10E3/uL   RBC 4.08 (L) 4.14 - 5.80 x10E6/uL   Hemoglobin 14.1 13.0 - 17.7 g/dL   Hematocrit 41.9 37.5 - 51.0 %   MCV 103 (H) 79 - 97 fL   MCH 34.6 (H) 26.6 - 33.0 pg   MCHC 33.7 31.5 - 35.7 g/dL   RDW 12.2 11.6 - 15.4 %   Platelets 220 150 - 450 x10E3/uL   Neutrophils 54 Not Estab. %   Lymphs 35 Not Estab. %   Monocytes 10 Not Estab. %   Eos 1 Not Estab. %   Basos 0 Not Estab. %   Neutrophils Absolute 3.0 1.4 - 7.0 x10E3/uL   Lymphocytes Absolute 2.0 0.7 - 3.1 x10E3/uL   Monocytes Absolute 0.6 0.1 - 0.9 x10E3/uL   EOS (ABSOLUTE) 0.0 0.0 - 0.4 x10E3/uL   Basophils Absolute 0.0 0.0 - 0.2 x10E3/uL   Immature Granulocytes 0 Not Estab. %   Immature Grans (Abs) 0.0 0.0 - 0.1 x10E3/uL  Lipid Panel w/o Chol/HDL Ratio  Result Value Ref Range   Cholesterol, Total 195 100 - 199 mg/dL   Triglycerides 50 0 - 149 mg/dL   HDL 92 >39 mg/dL   VLDL Cholesterol Cal 10 5 - 40 mg/dL   LDL Chol Calc (NIH) 93 0 - 99 mg/dL  PSA  Result Value Ref Range   Prostate Specific Ag, Serum 0.9 0.0 - 4.0 ng/mL  TSH  Result Value Ref Range   TSH 1.030 0.450 - 4.500 uIU/mL  Urinalysis, Routine w reflex microscopic  Result Value Ref Range   Specific Gravity, UA 1.010 1.005 - 1.030   pH, UA 7.5 5.0 -  7.5   Color, UA Yellow  Yellow   Appearance Ur Clear Clear   Leukocytes,UA Negative Negative   Protein,UA Negative Negative/Trace   Glucose, UA Negative Negative   Ketones, UA Negative Negative   RBC, UA Negative Negative   Bilirubin, UA Negative Negative   Urobilinogen, Ur 0.2 0.2 - 1.0 mg/dL   Nitrite, UA Negative Negative  Microalbumin, Urine Waived  Result Value Ref Range   Microalb, Ur Waived 10 0 - 19 mg/L   Creatinine, Urine Waived 50 10 - 300 mg/dL   Microalb/Creat Ratio <30 <30 mg/g      Assessment & Plan:   Problem List Items Addressed This Visit       Cardiovascular and Mediastinum   HTN (hypertension)    Under good control on current regimen. Continue current regimen. Continue to monitor. Call with any concerns. Refills given. Labs drawn today.        Relevant Medications   lisinopril (ZESTRIL) 5 MG tablet   Other Relevant Orders   Basic metabolic panel     Other   Insomnia    Under good control on current regimen. Continue current regimen. Continue to monitor. Call with any concerns. Refills given.       Other Visit Diagnoses     Need for shingles vaccine    -  Primary   Relevant Orders   Varicella-zoster vaccine IM (Shingrix) (Completed)        Follow up plan: Return in about 6 months (around 07/30/2022) for physical.

## 2022-01-29 LAB — BASIC METABOLIC PANEL
BUN/Creatinine Ratio: 16 (ref 9–20)
BUN: 12 mg/dL (ref 6–24)
CO2: 21 mmol/L (ref 20–29)
Calcium: 9.5 mg/dL (ref 8.7–10.2)
Chloride: 97 mmol/L (ref 96–106)
Creatinine, Ser: 0.73 mg/dL — ABNORMAL LOW (ref 0.76–1.27)
Glucose: 125 mg/dL — ABNORMAL HIGH (ref 70–99)
Potassium: 4.9 mmol/L (ref 3.5–5.2)
Sodium: 131 mmol/L — ABNORMAL LOW (ref 134–144)
eGFR: 111 mL/min/{1.73_m2} (ref >59)

## 2022-01-31 ENCOUNTER — Other Ambulatory Visit: Payer: Self-pay | Admitting: Family Medicine

## 2022-04-26 ENCOUNTER — Other Ambulatory Visit: Payer: Self-pay | Admitting: Family Medicine

## 2022-04-28 NOTE — Telephone Encounter (Signed)
Requested medication (s) are due for refill today: -  Requested medication (s) are on the active medication list: no  Last refill:  03/25/21 #90 3 RF  Future visit scheduled: yes  Notes to clinic:  second request for med refill Was dc'd 07/16/21 "pt preference"   Requested Prescriptions  Pending Prescriptions Disp Refills   montelukast (SINGULAIR) 10 MG tablet [Pharmacy Med Name: MONTELUKAST SOD 10 MG TABLET] 90 tablet 3    Sig: TAKE 1 TABLET BY MOUTH EVERYDAY AT BEDTIME     Pulmonology:  Leukotriene Inhibitors Passed - 04/26/2022 11:01 AM      Passed - Valid encounter within last 12 months    Recent Outpatient Visits           3 months ago Need for shingles vaccine   [place], DO   9 months ago Routine general medical examination at a health care facility   [place], [place], DO   1 year ago COVID-19   [place], MD   1 year ago Primary hypertension   [place], [place], DO   1 year ago Lateral epicondylitis of left elbow   [facility] Valerie Roys, DO       Future Appointments             In 3 months Wynetta Emery, Barb Merino, DO [facility], [facility]

## 2022-07-28 ENCOUNTER — Ambulatory Visit: Payer: Self-pay | Admitting: *Deleted

## 2022-07-28 NOTE — Telephone Encounter (Signed)
Summary: 2nd shingles shot   Pt would like to know when he is due for his next shingles shot / please advise         Attempted to reach pt., left VM to call back.  Appears first vaccine given 01/28/22. Unsure if [facility] able to secure nurse/vaccine visit for shingles. TL aware, states will follow up for clarification. Please advise.

## 2022-07-28 NOTE — Telephone Encounter (Signed)
Called patient back to schedule his nurse visit. Patient states he will call back to schedule. OK to schedule patient a nurse only visit for shingles.

## 2022-07-31 ENCOUNTER — Encounter: Payer: BC Managed Care – PPO | Admitting: Family Medicine

## 2022-08-03 ENCOUNTER — Other Ambulatory Visit: Payer: Self-pay | Admitting: Family Medicine

## 2022-08-04 ENCOUNTER — Other Ambulatory Visit: Payer: Self-pay | Admitting: Family Medicine

## 2022-08-07 NOTE — Telephone Encounter (Signed)
Requested medication (s) are due for refill today: Yes  Requested medication (s) are on the active medication list: Yes  Last refill:  01/28/22  Future visit scheduled: Yes  Notes to clinic:  See request.    Requested Prescriptions  Pending Prescriptions Disp Refills   naproxen (NAPROSYN) 500 MG tablet [Pharmacy Med Name: NAPROXEN 500 MG TABLET] 180 tablet 1    Sig: TAKE 1 TABLET BY MOUTH 2 TIMES DAILY WITH A MEAL.     Analgesics:  NSAIDS Failed - 08/03/2022  9:17 AM      Failed - Manual Review: Labs are only required if the patient has taken medication for more than 8 weeks.      Failed - Cr in normal range and within 360 days    Creatinine, Ser  Date Value Ref Range Status  01/28/2022 0.73 (L) 0.76 - 1.27 mg/dL Final         Failed - HGB in normal range and within 360 days    Hemoglobin  Date Value Ref Range Status  07/16/2021 14.1 13.0 - 17.7 g/dL Final         Failed - PLT in normal range and within 360 days    Platelets  Date Value Ref Range Status  07/16/2021 220 150 - 450 x10E3/uL Final         Failed - HCT in normal range and within 360 days    Hematocrit  Date Value Ref Range Status  07/16/2021 41.9 37.5 - 51.0 % Final         Passed - eGFR is 30 or above and within 360 days    GFR calc Af Amer  Date Value Ref Range Status  07/10/2020 132 >59 mL/min/1.73 Final    Comment:    **In accordance with recommendations from the NKF-ASN Task force,**   [facility] is in the process of updating its eGFR calculation to the   2021 CKD-EPI creatinine equation that estimates kidney function   without a race variable.    GFR calc non Af Amer  Date Value Ref Range Status  07/10/2020 114 >59 mL/min/1.73 Final   eGFR  Date Value Ref Range Status  01/28/2022 111 >59 mL/min/1.73 Final         Passed - Patient is not pregnant      Passed - Valid encounter within last 12 months    Recent Outpatient Visits           6 months ago Need for shingles vaccine    [facility] [place], Megan P, DO   1 year ago Routine general medical examination at a health care facility   [facility], [place], DO   1 year ago COVID-72   [place], MD   1 year ago Primary hypertension   [place], [place], DO   1 year ago Lateral epicondylitis of left elbow   [facility] [place], [place], DO       Future Appointments             In 1 month Johnson, Megan P, DO [facility], [facility]            Signed Prescriptions Disp Refills   traZODone (DESYREL) 100 MG tablet 90 tablet 0    Sig: TAKE 1/2 TO 1 TABLET (50-100 MG TOTAL) BY MOUTH AT BEDTIME AS NEEDED FOR SLEEP     Psychiatry: Antidepressants - Serotonin Modulator Failed - 08/03/2022  9:17  AM      Failed - Valid encounter within last 6 months    Recent Outpatient Visits           6 months ago Need for shingles vaccine   [place], Megan P, DO   1 year ago Routine general medical examination at a health care facility   [place], [place], DO   1 year ago COVID-3   [place], MD   1 year ago Primary hypertension   [place], [place], DO   1 year ago Lateral epicondylitis of left elbow   [facility] Valerie Roys, DO       Future Appointments             In 1 month Johnson, Barb Merino, DO [facility], [facility]

## 2022-09-04 ENCOUNTER — Other Ambulatory Visit: Payer: Self-pay | Admitting: Family Medicine

## 2022-09-04 NOTE — Telephone Encounter (Signed)
Courtesy refill. Future visit in  week. Requested Prescriptions  Pending Prescriptions Disp Refills   lisinopril (ZESTRIL) 5 MG tablet [Pharmacy Med Name: LISINOPRIL 5 MG TABLET] 90 tablet 0    Sig: TAKE 1 TABLET (5 MG TOTAL) BY MOUTH DAILY.     Cardiovascular:  ACE Inhibitors Failed - 09/04/2022  1:43 AM      Failed - Cr in normal range and within 180 days    Creatinine, Ser  Date Value Ref Range Status  01/28/2022 0.73 (L) 0.76 - 1.27 mg/dL Final         Failed - K in normal range and within 180 days    Potassium  Date Value Ref Range Status  01/28/2022 4.9 3.5 - 5.2 mmol/L Final         Failed - Valid encounter within last 6 months    Recent Outpatient Visits           7 months ago Need for shingles vaccine   [facility] Valerie Roys, DO   1 year ago Routine general medical examination at a health care facility   [place], [place], DO   1 year ago [place] Charlynne Cousins, MD   1 year ago Primary hypertension   [place], [place] P, DO   1 year ago Lateral epicondylitis of left elbow   [place], [place], DO       Future Appointments             In 1 week Valerie Roys, DO [place], [place] - Patient is not pregnant      Passed - Last BP in normal range    BP Readings from Last 1 Encounters:  01/28/22 129/75

## 2022-09-17 ENCOUNTER — Encounter: Payer: Self-pay | Admitting: Family Medicine

## 2022-10-16 ENCOUNTER — Ambulatory Visit (INDEPENDENT_AMBULATORY_CARE_PROVIDER_SITE_OTHER): Payer: Managed Care, Other (non HMO) | Admitting: Family Medicine

## 2022-10-16 ENCOUNTER — Encounter: Payer: Self-pay | Admitting: Family Medicine

## 2022-10-16 ENCOUNTER — Telehealth: Payer: Self-pay

## 2022-10-16 VITALS — BP 163/101 | HR 88 | Temp 98.8°F | Ht 69.8 in | Wt 171.6 lb

## 2022-10-16 DIAGNOSIS — Z0000 Encounter for general adult medical examination without abnormal findings: Secondary | ICD-10-CM

## 2022-10-16 DIAGNOSIS — Z23 Encounter for immunization: Secondary | ICD-10-CM | POA: Diagnosis not present

## 2022-10-16 DIAGNOSIS — G4700 Insomnia, unspecified: Secondary | ICD-10-CM

## 2022-10-16 DIAGNOSIS — Z1211 Encounter for screening for malignant neoplasm of colon: Secondary | ICD-10-CM | POA: Diagnosis not present

## 2022-10-16 DIAGNOSIS — I10 Essential (primary) hypertension: Secondary | ICD-10-CM | POA: Diagnosis not present

## 2022-10-16 LAB — URINALYSIS, ROUTINE W REFLEX MICROSCOPIC
Bilirubin, UA: NEGATIVE
Glucose, UA: NEGATIVE
Ketones, UA: NEGATIVE
Leukocytes,UA: NEGATIVE
Nitrite, UA: NEGATIVE
RBC, UA: NEGATIVE
Specific Gravity, UA: 1.015 (ref 1.005–1.030)
Urobilinogen, Ur: 0.2 mg/dL (ref 0.2–1.0)
pH, UA: 6.5 (ref 5.0–7.5)

## 2022-10-16 LAB — MICROSCOPIC EXAMINATION
Bacteria, UA: NONE SEEN
RBC, Urine: NONE SEEN /hpf (ref 0–2)

## 2022-10-16 LAB — MICROALBUMIN, URINE WAIVED
Creatinine, Urine Waived: 50 mg/dL (ref 10–300)
Microalb, Ur Waived: 80 mg/L — ABNORMAL HIGH (ref 0–19)

## 2022-10-16 MED ORDER — FEXOFENADINE HCL 180 MG PO TABS: 180.0000 mg | ORAL_TABLET | Freq: Every day | ORAL | 1 refills | Status: DC

## 2022-10-16 MED ORDER — MONTELUKAST SODIUM 10 MG PO TABS: ORAL_TABLET | ORAL | 1 refills | Status: DC

## 2022-10-16 MED ORDER — TRAZODONE HCL 100 MG PO TABS: 100.0000 mg | ORAL_TABLET | Freq: Every evening | ORAL | 1 refills | Status: DC | PRN

## 2022-10-16 MED ORDER — LISINOPRIL 5 MG PO TABS: 5.0000 mg | ORAL_TABLET | Freq: Every day | ORAL | 1 refills | Status: DC

## 2022-10-16 MED ORDER — METRONIDAZOLE 1 % EX GEL: Freq: Every day | CUTANEOUS | 12 refills | Status: DC

## 2022-10-16 NOTE — Assessment & Plan Note (Signed)
Under good control on current regimen. Continue current regimen. Continue to monitor. Call with any concerns. Refills given.   

## 2022-10-16 NOTE — Telephone Encounter (Signed)
Patient was given his Shingles vaccination while in office today for an appointment.

## 2022-10-16 NOTE — Telephone Encounter (Signed)
-----   Message from Valerie Roys, DO sent at 10/16/2022  8:56 AM EST ----- Wants shingles shot when it comes in

## 2022-10-16 NOTE — Assessment & Plan Note (Addendum)
Still running high. Did not take his medicine today. Will take his medicine and start monitoring at home. Recheck 3 months. Refills given today.

## 2022-10-16 NOTE — Progress Notes (Signed)
BP (!) 163/101 (BP Location: Left Arm, Cuff Size: Normal)   Pulse 88   Temp 98.8 F (37.1 C) (Oral)   Ht 5' 9.8" (1.773 m)   Wt 171 lb 9.6 oz (77.8 kg)   SpO2 98%   BMI 24.76 kg/m    Subjective:    Patient ID: Edward Mckenzie, male    DOB: 1970-10-01, 52 y.o.   MRN: [id number]  HPI: Edward Mckenzie is a 52 y.o. male presenting on 10/16/2022 for comprehensive medical examination. Current medical complaints include:  HYPERTENSION- forgot to take his medicine today Hypertension status: uncontrolled  Satisfied with current treatment? yes Duration of hypertension: chronic BP monitoring frequency:  not checking BP range:  BP medication side effects:  no Medication compliance: good compliance Previous BP meds:lisinopril Aspirin: no Recurrent headaches: no Visual changes: no Palpitations: no Dyspnea: no Chest pain: no Lower extremity edema: no Dizzy/lightheaded: no  Interim Problems from his last visit: no  Depression Screen done today and results listed below:     10/16/2022    8:04 AM 01/28/2022    8:24 AM 07/16/2021    8:06 AM 03/12/2021    8:24 AM 07/10/2020   10:01 AM  Depression screen PHQ 2/9  Decreased Interest 0 0 0 0 0  Down, Depressed, Hopeless 0 0 0 0 0  PHQ - 2 Score 0 0 0 0 0  Altered sleeping 1 0 0    Tired, decreased energy 0 0 1    Change in appetite 0 0 0    Feeling bad or failure about yourself  0 0 0    Trouble concentrating 0 0 0    Moving slowly or fidgety/restless 0 0 0    Suicidal thoughts 0 0 0    PHQ-9 Score 1 0 1    Difficult doing work/chores Not difficult at all Not difficult at all Not difficult at all       Past Medical History:  Past Medical History:  Diagnosis Date   Carpal tunnel syndrome of right wrist    History of dysplastic nevus 01/03/2021   left posterior ear, moderate atypia   Hypertension    Jones fracture    left foot   Wears contact lenses     Surgical History:  Past Surgical History:  Procedure  Laterality Date   BACK SURGERY     BREAST BIOPSY Left 06/22/2018   [place] bx BENIGN FIBROFATTY TISSUE WITH DEGENERATIVE CHANGES   HERNIA REPAIR     KNEE SURGERY     NASAL SINUS SURGERY  02/22/98   UPPP, tonguepexy, septo.  Dr. Kathyrn Sheriff. [facility]   ORIF TOE FRACTURE Left 12/20/2015   Procedure: OPEN REDUCTION INTERNAL FIXATION (ORIF) 5TH METATARSAL (TOE) FRACTURE LEFT;  Surgeon: Albertine Patricia, DPM;  Location: [facility];  Service: Podiatry;  Laterality: Left;  LMA WITH POPLITEAL   TONSILLECTOMY AND ADENOIDECTOMY     UVULOPALATOPHARYNGOPLASTY     XI ROBOTIC ASSISTED INGUINAL HERNIA REPAIR WITH MESH Bilateral 01/06/2020   Procedure: XI ROBOTIC ASSISTED INGUINAL HERNIA REPAIR WITH MESH;  Surgeon: Jules Husbands, MD;  Location: [facility];  Service: General;  Laterality: Bilateral;    Medications:  Current Outpatient Medications on File Prior to Visit  Medication Sig   Ascorbic Acid (VITAMIN C) 100 MG tablet Take 100 mg by mouth daily.   cyclobenzaprine (FLEXERIL) 10 MG tablet Take 1 tablet (10 mg total) by mouth at bedtime as needed for muscle spasms.   Multiple Vitamin (ONE-A-DAY MENS  PO) Take 1 tablet by mouth daily.    Multiple Vitamins-Minerals (ZINC PO) Take 1 tablet by mouth daily.    naproxen (NAPROSYN) 500 MG tablet TAKE 1 TABLET BY MOUTH 2 TIMES DAILY WITH A MEAL.   Omega-3 Fatty Acids (FISH OIL) 1000 MG CAPS Take 1,000 mg by mouth daily.   triamcinolone ointment (KENALOG) 0.5 % Apply 1 application topically 2 (two) times daily.   VITAMIN D PO Take 1 tablet by mouth daily.    No current facility-administered medications on file prior to visit.    Allergies:  No Known Allergies  Social History:  Social History   Socioeconomic History   Marital status: Single    Spouse name: Not on file   Number of children: Not on file   Years of education: Not on file   Highest education level: Not on file  Occupational History   Not on file  Tobacco Use   Smoking status: Every Day     Packs/day: 1.50    Years: 15.00    Total pack years: 22.50    Types: Cigarettes   Smokeless tobacco: Former  Scientific laboratory technician Use: Never used  Substance and Sexual Activity   Alcohol use: Not Currently    Alcohol/week: 0.0 standard drinks of alcohol   Drug use: No   Sexual activity: Yes  Other Topics Concern   Not on file  Social History Narrative   Not on file   Social Determinants of Health   Financial Resource Strain: Not on file  Food Insecurity: Not on file  Transportation Needs: Not on file  Physical Activity: Not on file  Stress: Not on file  Social Connections: Not on file  Intimate Partner Violence: Not on file   Social History   Tobacco Use  Smoking Status Every Day   Packs/day: 1.50   Years: 15.00   Total pack years: 22.50   Types: Cigarettes  Smokeless Tobacco Former   Social History   Substance and Sexual Activity  Alcohol Use Not Currently   Alcohol/week: 0.0 standard drinks of alcohol    Family History:  Family History  Problem Relation Age of Onset   Heart disease Father    Diabetes Paternal Grandfather    Heart disease Paternal Grandfather    Cancer Paternal Grandfather        unknown location   Breast cancer Neg Hx    Colon cancer Neg Hx     Past medical history, surgical history, medications, allergies, family history and social history reviewed with patient today and changes made to appropriate areas of the chart.   Review of Systems  Constitutional: Negative.   HENT:  Positive for ear pain (L ear). Negative for congestion, ear discharge, hearing loss, nosebleeds, sinus pain, sore throat and tinnitus.   Eyes: Negative.   Respiratory: Negative.  Negative for stridor.   Cardiovascular: Negative.   Gastrointestinal: Negative.   Genitourinary: Negative.   Musculoskeletal: Negative.   Skin: Negative.   Neurological: Negative.   Endo/Heme/Allergies:  Negative for environmental allergies and polydipsia. Bruises/bleeds easily.   Psychiatric/Behavioral: Negative.     All other ROS negative except what is listed above and in the HPI.      Objective:    BP (!) 163/101 (BP Location: Left Arm, Cuff Size: Normal)   Pulse 88   Temp 98.8 F (37.1 C) (Oral)   Ht 5' 9.8" (1.773 m)   Wt 171 lb 9.6 oz (77.8 kg)   SpO2 98%  BMI 24.76 kg/m   Wt Readings from Last 3 Encounters:  10/16/22 171 lb 9.6 oz (77.8 kg)  01/28/22 181 lb (82.1 kg)  07/16/21 190 lb 6.4 oz (86.4 kg)    Physical Exam Vitals and nursing note reviewed.  Constitutional:      General: He is not in acute distress.    Appearance: Normal appearance. He is not ill-appearing, toxic-appearing or diaphoretic.  HENT:     Head: Normocephalic and atraumatic.     Right Ear: Tympanic membrane, ear canal and external ear normal. There is no impacted cerumen.     Left Ear: Tympanic membrane, ear canal and external ear normal. There is no impacted cerumen.     Nose: Nose normal. No congestion or rhinorrhea.     Mouth/Throat:     Mouth: Mucous membranes are moist.     Pharynx: Oropharynx is clear. No oropharyngeal exudate or posterior oropharyngeal erythema.  Eyes:     General: No scleral icterus.       Right eye: No discharge.        Left eye: No discharge.     Extraocular Movements: Extraocular movements intact.     Conjunctiva/sclera: Conjunctivae normal.     Pupils: Pupils are equal, round, and reactive to light.  Neck:     Vascular: No carotid bruit.  Cardiovascular:     Rate and Rhythm: Normal rate and regular rhythm.     Pulses: Normal pulses.     Heart sounds: No murmur heard.    No friction rub. No gallop.  Pulmonary:     Effort: Pulmonary effort is normal. No respiratory distress.     Breath sounds: Normal breath sounds. No stridor. No wheezing, rhonchi or rales.  Chest:     Chest wall: No tenderness.  Abdominal:     General: Abdomen is flat. Bowel sounds are normal. There is no distension.     Palpations: Abdomen is soft. There is no  mass.     Tenderness: There is no abdominal tenderness. There is no right CVA tenderness, left CVA tenderness, guarding or rebound.     Hernia: No hernia is present.  Genitourinary:    Comments: Genital exam deferred with shared decision making Musculoskeletal:        General: No swelling, tenderness, deformity or signs of injury.     Cervical back: Normal range of motion and neck supple. No rigidity. No muscular tenderness.     Right lower leg: No edema.     Left lower leg: No edema.  Lymphadenopathy:     Cervical: No cervical adenopathy.  Skin:    General: Skin is warm and dry.     Capillary Refill: Capillary refill takes less than 2 seconds.     Coloration: Skin is not jaundiced or pale.     Findings: No bruising, erythema, lesion or rash.  Neurological:     General: No focal deficit present.     Mental Status: He is alert and oriented to person, place, and time.     Cranial Nerves: No cranial nerve deficit.     Sensory: No sensory deficit.     Motor: No weakness.     Coordination: Coordination normal.     Gait: Gait normal.     Deep Tendon Reflexes: Reflexes normal.  Psychiatric:        Mood and Affect: Mood normal.        Behavior: Behavior normal.        Thought Content: Thought content  normal.        Judgment: Judgment normal.     Results for orders placed or performed in visit on 10/16/22  Microscopic Examination   Urine  Result Value Ref Range   WBC, UA 0-5 0 - 5 /hpf   RBC, Urine None seen 0 - 2 /hpf   Epithelial Cells (non renal) 0-10 0 - 10 /hpf   Bacteria, UA None seen None seen/Few  Urinalysis, Routine w reflex microscopic  Result Value Ref Range   Specific Gravity, UA 1.015 1.005 - 1.030   pH, UA 6.5 5.0 - 7.5   Color, UA Yellow Yellow   Appearance Ur Clear Clear   Leukocytes,UA Negative Negative   Protein,UA 1+ (A) Negative/Trace   Glucose, UA Negative Negative   Ketones, UA Negative Negative   RBC, UA Negative Negative   Bilirubin, UA Negative  Negative   Urobilinogen, Ur 0.2 0.2 - 1.0 mg/dL   Nitrite, UA Negative Negative   Microscopic Examination See below:   Microalbumin, Urine Waived  Result Value Ref Range   Microalb, Ur Waived 80 (H) 0 - 19 mg/L   Creatinine, Urine Waived 50 10 - 300 mg/dL   Microalb/Creat Ratio 30-300 (H) <30 mg/g      Assessment & Plan:   Problem List Items Addressed This Visit       Cardiovascular and Mediastinum   HTN (hypertension)    Still running high. Did not take his medicine today. Will take his medicine and start monitoring at home. Recheck 3 months. Refills given today.       Relevant Medications   lisinopril (ZESTRIL) 5 MG tablet   Other Relevant Orders   Microalbumin, Urine Waived (Completed)     Other   Insomnia    Under good control on current regimen. Continue current regimen. Continue to monitor. Call with any concerns. Refills given.        Other Visit Diagnoses     Routine general medical examination at a health care facility    -  Primary   Vaccines up to date. Screening labs checked today. Cologuard ordered today. Continue diet and exercise. Call with any concerns.   Relevant Orders   Comprehensive metabolic panel   CBC with Differential/Platelet   Lipid Panel w/o Chol/HDL Ratio   PSA   TSH   Urinalysis, Routine w reflex microscopic (Completed)   Screening for colon cancer       Cologuard ordered today.   Relevant Orders   Cologuard   Need for shingles vaccine       Shingles #2 given today.       LABORATORY TESTING:  Health maintenance labs ordered today as discussed above.   The natural history of prostate cancer and ongoing controversy regarding screening and potential treatment outcomes of prostate cancer has been discussed with the patient. The meaning of a false positive PSA and a false negative PSA has been discussed. He indicates understanding of the limitations of this screening test and wishes to proceed with screening PSA  testing.   IMMUNIZATIONS:   - Tdap: Tetanus vaccination status reviewed: last tetanus booster within 10 years. - Influenza: Refused - Pneumovax: Up to date - Prevnar: Not applicable - COVID: Refused - HPV: Not applicable - Shingrix vaccine: Administered today  SCREENING: - Colonoscopy: cologuard ordered today  Discussed with patient purpose of the colonoscopy is to detect colon cancer at curable precancerous or early stages   PATIENT COUNSELING:    Sexuality: Discussed sexually transmitted diseases,  partner selection, use of condoms, avoidance of unintended pregnancy  and contraceptive alternatives.   Advised to avoid cigarette smoking.  I discussed with the patient that most people either abstain from alcohol or drink within safe limits (<=14/week and <=4 drinks/occasion for males, <=7/weeks and <= 3 drinks/occasion for females) and that the risk for alcohol disorders and other health effects rises proportionally with the number of drinks per week and how often a drinker exceeds daily limits.  Discussed cessation/primary prevention of drug use and availability of treatment for abuse.   Diet: Encouraged to adjust caloric intake to maintain  or achieve ideal body weight, to reduce intake of dietary saturated fat and total fat, to limit sodium intake by avoiding high sodium foods and not adding table salt, and to maintain adequate dietary potassium and calcium preferably from fresh fruits, vegetables, and low-fat dairy products.    stressed the importance of regular exercise  Injury prevention: Discussed safety belts, safety helmets, smoke detector, smoking near bedding or upholstery.   Dental health: Discussed importance of regular tooth brushing, flossing, and dental visits.   Follow up plan: NEXT PREVENTATIVE PHYSICAL DUE IN 1 YEAR. Return in about 3 months (around 01/16/2023) for virtual OK for BP, then 6 months in person.

## 2022-10-17 LAB — COMPREHENSIVE METABOLIC PANEL
ALT: 26 IU/L (ref 0–44)
AST: 25 IU/L (ref 0–40)
Albumin/Globulin Ratio: 2.1 (ref 1.2–2.2)
Albumin: 5 g/dL — ABNORMAL HIGH (ref 3.8–4.9)
Alkaline Phosphatase: 65 IU/L (ref 44–121)
BUN/Creatinine Ratio: 16 (ref 9–20)
BUN: 12 mg/dL (ref 6–24)
Bilirubin Total: 0.4 mg/dL (ref 0.0–1.2)
CO2: 22 mmol/L (ref 20–29)
Calcium: 9.6 mg/dL (ref 8.7–10.2)
Chloride: 97 mmol/L (ref 96–106)
Creatinine, Ser: 0.75 mg/dL — ABNORMAL LOW (ref 0.76–1.27)
Globulin, Total: 2.4 g/dL (ref 1.5–4.5)
Glucose: 117 mg/dL — ABNORMAL HIGH (ref 70–99)
Potassium: 4.6 mmol/L (ref 3.5–5.2)
Sodium: 134 mmol/L (ref 134–144)
Total Protein: 7.4 g/dL (ref 6.0–8.5)
eGFR: 109 mL/min/{1.73_m2} (ref >59)

## 2022-10-17 LAB — CBC WITH DIFFERENTIAL/PLATELET
Basophils Absolute: 0 10*3/uL (ref 0.0–0.2)
Basos: 0 %
EOS (ABSOLUTE): 0.1 10*3/uL (ref 0.0–0.4)
Eos: 2 %
Hematocrit: 47.7 % (ref 37.5–51.0)
Hemoglobin: 16.1 g/dL (ref 13.0–17.7)
Immature Grans (Abs): 0 10*3/uL (ref 0.0–0.1)
Immature Granulocytes: 0 %
Lymphocytes Absolute: 1.6 10*3/uL (ref 0.7–3.1)
Lymphs: 29 %
MCH: 34.8 pg — ABNORMAL HIGH (ref 26.6–33.0)
MCHC: 33.8 g/dL (ref 31.5–35.7)
MCV: 103 fL — ABNORMAL HIGH (ref 79–97)
Monocytes Absolute: 0.4 10*3/uL (ref 0.1–0.9)
Monocytes: 8 %
Neutrophils Absolute: 3.2 10*3/uL (ref 1.4–7.0)
Neutrophils: 61 %
Platelets: 228 10*3/uL (ref 150–450)
RBC: 4.63 x10E6/uL (ref 4.14–5.80)
RDW: 12.1 % (ref 11.6–15.4)
WBC: 5.4 10*3/uL (ref 3.4–10.8)

## 2022-10-17 LAB — PSA: Prostate Specific Ag, Serum: 0.8 ng/mL (ref 0.0–4.0)

## 2022-10-17 LAB — LIPID PANEL W/O CHOL/HDL RATIO
Cholesterol, Total: 242 mg/dL — ABNORMAL HIGH (ref 100–199)
HDL: 101 mg/dL (ref >39)
LDL Chol Calc (NIH): 134 mg/dL — ABNORMAL HIGH (ref 0–99)
Triglycerides: 46 mg/dL (ref 0–149)
VLDL Cholesterol Cal: 7 mg/dL (ref 5–40)

## 2022-10-17 LAB — TSH: TSH: 0.82 u[IU]/mL (ref 0.450–4.500)

## 2023-01-19 ENCOUNTER — Telehealth (INDEPENDENT_AMBULATORY_CARE_PROVIDER_SITE_OTHER): Payer: Managed Care, Other (non HMO) | Admitting: Family Medicine

## 2023-01-19 ENCOUNTER — Encounter: Payer: Self-pay | Admitting: Family Medicine

## 2023-01-19 DIAGNOSIS — I10 Essential (primary) hypertension: Secondary | ICD-10-CM

## 2023-01-19 MED ORDER — LISINOPRIL 10 MG PO TABS: 10.0000 mg | ORAL_TABLET | Freq: Every day | ORAL | 1 refills | Status: DC

## 2023-01-19 MED ORDER — FEXOFENADINE HCL 180 MG PO TABS: 180.0000 mg | ORAL_TABLET | Freq: Every day | ORAL | 1 refills | Status: DC

## 2023-01-19 NOTE — Assessment & Plan Note (Signed)
Still running high at home. Will increase his lisinopril to 10mg  and recheck at appt in 3 months. Call with any concerns.

## 2023-01-19 NOTE — Progress Notes (Signed)
There were no vitals taken for this visit.   Subjective:    Patient ID: Edward Mckenzie, male    DOB: 18-May-1971, 52 y.o.   MRN: [id number]  HPI: Edward Mckenzie is a 52 y.o. male  Chief Complaint  Patient presents with   Hypertension    Patient says he has noticed some readings ranging from normal to high readings. Patient says he is only able to check his BP in the evenings due to him getting up so early in the mornings and driving. Patient says he does not have any symptoms indicating his BP is elevated and he feels perfectly fine.    HYPERTENSION  Hypertension status: up and down  Satisfied with current treatment? yes Duration of hypertension: chronic BP monitoring frequency:  a few times a week BP range: 151/91, 136/81 BP medication side effects:  no Medication compliance: excellent compliance Previous BP meds:lisinopril Aspirin: no Recurrent headaches: no Visual changes: no Palpitations: no Dyspnea: no Chest pain: no Lower extremity edema: no Dizzy/lightheaded: no   Relevant past medical, surgical, family and social history reviewed and updated as indicated. Interim medical history since our last visit reviewed. Allergies and medications reviewed and updated.  Review of Systems  Constitutional: Negative.   Respiratory: Negative.    Cardiovascular: Negative.   Gastrointestinal: Negative.   Musculoskeletal: Negative.   Neurological: Negative.   Psychiatric/Behavioral: Negative.      Per HPI unless specifically indicated above     Objective:    There were no vitals taken for this visit.  Wt Readings from Last 3 Encounters:  10/16/22 171 lb 9.6 oz (77.8 kg)  01/28/22 181 lb (82.1 kg)  07/16/21 190 lb 6.4 oz (86.4 kg)    Physical Exam Constitutional:      General: He is not in acute distress.    Appearance: Normal appearance. He is well-developed. He is not ill-appearing, toxic-appearing or diaphoretic.  HENT:     Head: Normocephalic and  atraumatic.     Right Ear: Hearing and external ear normal.     Left Ear: Hearing and external ear normal.     Nose: Nose normal.  Eyes:     General: Lids are normal. No scleral icterus.       Right eye: No discharge.        Left eye: No discharge.     Extraocular Movements: Extraocular movements intact.     Conjunctiva/sclera: Conjunctivae normal.     Pupils: Pupils are equal, round, and reactive to light.  Pulmonary:     Effort: Pulmonary effort is normal. No respiratory distress.  Musculoskeletal:        General: Normal range of motion.     Cervical back: Normal range of motion.  Skin:    Coloration: Skin is not jaundiced or pale.     Findings: No bruising, erythema, lesion or rash.  Neurological:     General: No focal deficit present.     Mental Status: He is alert and oriented to person, place, and time.  Psychiatric:        Mood and Affect: Mood normal.        Speech: Speech normal.        Behavior: Behavior normal.        Thought Content: Thought content normal.        Judgment: Judgment normal.     Results for orders placed or performed in visit on 10/16/22  Microscopic Examination   Urine  Result Value Ref  Range   WBC, UA 0-5 0 - 5 /hpf   RBC, Urine None seen 0 - 2 /hpf   Epithelial Cells (non renal) 0-10 0 - 10 /hpf   Bacteria, UA None seen None seen/Few  Comprehensive metabolic panel  Result Value Ref Range   Glucose 117 (H) 70 - 99 mg/dL   BUN 12 6 - 24 mg/dL   Creatinine, Ser 8.65 (L) 0.76 - 1.27 mg/dL   eGFR 784 >69 GE/XBM/8.41   BUN/Creatinine Ratio 16 9 - 20   Sodium 134 134 - 144 mmol/L   Potassium 4.6 3.5 - 5.2 mmol/L   Chloride 97 96 - 106 mmol/L   CO2 22 20 - 29 mmol/L   Calcium 9.6 8.7 - 10.2 mg/dL   Total Protein 7.4 6.0 - 8.5 g/dL   Albumin 5.0 (H) 3.8 - 4.9 g/dL   Globulin, Total 2.4 1.5 - 4.5 g/dL   Albumin/Globulin Ratio 2.1 1.2 - 2.2   Bilirubin Total 0.4 0.0 - 1.2 mg/dL   Alkaline Phosphatase 65 44 - 121 IU/L   AST 25 0 - 40 IU/L    ALT 26 0 - 44 IU/L  CBC with Differential/Platelet  Result Value Ref Range   WBC 5.4 3.4 - 10.8 x10E3/uL   RBC 4.63 4.14 - 5.80 x10E6/uL   Hemoglobin 16.1 13.0 - 17.7 g/dL   Hematocrit 32.4 40.1 - 51.0 %   MCV 103 (H) 79 - 97 fL   MCH 34.8 (H) 26.6 - 33.0 pg   MCHC 33.8 31.5 - 35.7 g/dL   RDW 02.7 25.3 - 66.4 %   Platelets 228 150 - 450 x10E3/uL   Neutrophils 61 Not Estab. %   Lymphs 29 Not Estab. %   Monocytes 8 Not Estab. %   Eos 2 Not Estab. %   Basos 0 Not Estab. %   Neutrophils Absolute 3.2 1.4 - 7.0 x10E3/uL   Lymphocytes Absolute 1.6 0.7 - 3.1 x10E3/uL   Monocytes Absolute 0.4 0.1 - 0.9 x10E3/uL   EOS (ABSOLUTE) 0.1 0.0 - 0.4 x10E3/uL   Basophils Absolute 0.0 0.0 - 0.2 x10E3/uL   Immature Granulocytes 0 Not Estab. %   Immature Grans (Abs) 0.0 0.0 - 0.1 x10E3/uL  Lipid Panel w/o Chol/HDL Ratio  Result Value Ref Range   Cholesterol, Total 242 (H) 100 - 199 mg/dL   Triglycerides 46 0 - 149 mg/dL   HDL 403 >47 mg/dL   VLDL Cholesterol Cal 7 5 - 40 mg/dL   LDL Chol Calc (NIH) 425 (H) 0 - 99 mg/dL  PSA  Result Value Ref Range   Prostate Specific Ag, Serum 0.8 0.0 - 4.0 ng/mL  TSH  Result Value Ref Range   TSH 0.820 0.450 - 4.500 uIU/mL  Urinalysis, Routine w reflex microscopic  Result Value Ref Range   Specific Gravity, UA 1.015 1.005 - 1.030   pH, UA 6.5 5.0 - 7.5   Color, UA Yellow Yellow   Appearance Ur Clear Clear   Leukocytes,UA Negative Negative   Protein,UA 1+ (A) Negative/Trace   Glucose, UA Negative Negative   Ketones, UA Negative Negative   RBC, UA Negative Negative   Bilirubin, UA Negative Negative   Urobilinogen, Ur 0.2 0.2 - 1.0 mg/dL   Nitrite, UA Negative Negative   Microscopic Examination See below:   Microalbumin, Urine Waived  Result Value Ref Range   Microalb, Ur Waived 80 (H) 0 - 19 mg/L   Creatinine, Urine Waived 50 10 - 300 mg/dL   Microalb/Creat  Ratio 30-300 (H) <30 mg/g      Assessment & Plan:   Problem List Items Addressed This  Visit       Cardiovascular and Mediastinum   HTN (hypertension) - Primary    Still running high at home. Will increase his lisinopril to 10mg  and recheck at appt in 3 months. Call with any concerns.       Relevant Medications   lisinopril (ZESTRIL) 10 MG tablet     Follow up plan: Return in about 3 months (around 04/21/2023).   This visit was completed via video visit through MyChart due to the restrictions of the COVID-19 pandemic. All issues as above were discussed and addressed. Physical exam was done as above through visual confirmation on video through MyChart. If it was felt that the patient should be evaluated in the office, they were directed there. The patient verbally consented to this visit. Location of the patient: parking lot Location of the provider: work Those involved with this call:  Provider: Olevia Perches, DO CMA: Malen Gauze, CMA Front Desk/Registration:  Servando Snare   Time spent on call:  15 minutes with patient face to face via video conference. More than 50% of this time was spent in counseling and coordination of care. 23 minutes total spent in review of patient's record and preparation of their chart.

## 2023-03-29 ENCOUNTER — Other Ambulatory Visit: Payer: Self-pay | Admitting: Family Medicine

## 2023-03-31 NOTE — Telephone Encounter (Signed)
Requested Prescriptions  Pending Prescriptions Disp Refills   naproxen (NAPROSYN) 500 MG tablet [Pharmacy Med Name: NAPROXEN 500 MG TABLET] 180 tablet 0    Sig: TAKE 1 TABLET BY MOUTH TWICE A DAY WITH FOOD     Analgesics:  NSAIDS Failed - 03/29/2023 11:12 AM      Failed - Manual Review: Labs are only required if the patient has taken medication for more than 8 weeks.      Failed - Cr in normal range and within 360 days    Creatinine, Ser  Date Value Ref Range Status  10/16/2022 0.75 (L) 0.76 - 1.27 mg/dL Final         Passed - HGB in normal range and within 360 days    Hemoglobin  Date Value Ref Range Status  10/16/2022 16.1 13.0 - 17.7 g/dL Final         Passed - PLT in normal range and within 360 days    Platelets  Date Value Ref Range Status  10/16/2022 228 150 - 450 x10E3/uL Final         Passed - HCT in normal range and within 360 days    Hematocrit  Date Value Ref Range Status  10/16/2022 47.7 37.5 - 51.0 % Final         Passed - eGFR is 30 or above and within 360 days    GFR calc Af Amer  Date Value Ref Range Status  07/10/2020 132 >59 mL/min/1.73 Final    Comment:    **In accordance with recommendations from the NKF-ASN Task force,**   [facility] is in the process of updating its eGFR calculation to the   2021 CKD-EPI creatinine equation that estimates kidney function   without a race variable.    GFR calc non Af Amer  Date Value Ref Range Status  07/10/2020 114 >59 mL/min/1.73 Final   eGFR  Date Value Ref Range Status  10/16/2022 109 >59 mL/min/1.73 Final         Passed - Patient is not pregnant      Passed - Valid encounter within last 12 months    Recent Outpatient Visits           2 months ago Primary hypertension   [facility] [place], Megan P, DO   5 months ago Routine general medical examination at a health care facility   [facility] [place], [place] P, DO   1 year ago Need for shingles vaccine    [place] [facility] Dorcas Carrow, DO   1 year ago Routine general medical examination at a health care facility   [facility] [place], Megan P, DO   2 years ago COVID-19   [place] [facility] Loura Pardon, MD       Future Appointments             In 3 weeks Laural Benes, Oralia Rud, DO [place] [facility]

## 2023-04-05 ENCOUNTER — Other Ambulatory Visit: Payer: Self-pay | Admitting: Family Medicine

## 2023-04-07 NOTE — Telephone Encounter (Signed)
Requested Prescriptions  Pending Prescriptions Disp Refills   montelukast (SINGULAIR) 10 MG tablet [Pharmacy Med Name: MONTELUKAST SOD 10 MG TABLET] 90 tablet 0    Sig: TAKE 1 TABLET BY MOUTH EVERYDAY AT BEDTIME     Pulmonology:  Leukotriene Inhibitors Passed - 04/05/2023 11:07 AM      Passed - Valid encounter within last 12 months    Recent Outpatient Visits           2 months ago Primary hypertension   [place] [facility] [place], Megan P, DO   5 months ago Routine general medical examination at a health care facility   [facility] [place], [place] P, DO   1 year ago Need for shingles vaccine   [place] [facility] [place], [place] P, DO   1 year ago Routine general medical examination at a health care facility   [facility] [place], [place] P, DO   2 years ago COVID-19   [place] [facility] [place], Roma Schanz, MD       Future Appointments             In 2 weeks Laural Benes, Oralia Rud, DO [place] [facility], [facility]             lisinopril (ZESTRIL) 5 MG tablet [Pharmacy Med Name: LISINOPRIL 5 MG TABLET] 90 tablet 1    Sig: TAKE 1 TABLET (5 MG TOTAL) BY MOUTH DAILY.     Cardiovascular:  ACE Inhibitors Failed - 04/05/2023 11:07 AM      Failed - Cr in normal range and within 180 days    Creatinine, Ser  Date Value Ref Range Status  10/16/2022 0.75 (L) 0.76 - 1.27 mg/dL Final         Failed - Last BP in normal range    BP Readings from Last 1 Encounters:  10/16/22 (!) 163/101         Passed - K in normal range and within 180 days    Potassium  Date Value Ref Range Status  10/16/2022 4.6 3.5 - 5.2 mmol/L Final         Passed - Patient is not pregnant      Passed - Valid encounter within last 6 months    Recent Outpatient Visits           2 months ago Primary hypertension   [place] [facility] [place], Megan P, DO   5 months ago Routine general  medical examination at a health care facility   [facility] [place], [place] P, DO   1 year ago Need for shingles vaccine   [facility] [place], [place] P, DO   1 year ago Routine general medical examination at a health care facility   [facility] [place], [place] P, DO   2 years ago COVID-19   [place] [facility] V[facility], Roma Schanz, MD       Future Appointments             In 2 weeks Laural Benes, Oralia Rud, DO [place] [facility], [facility]             traZODone (DESYREL) 100 MG tablet [Pharmacy Med Name: TRAZODONE 100 MG TABLET] 90 tablet 0    Sig: TAKE 1 TABLET BY MOUTH AT BEDTIME AS NEEDED FOR SLEEP.     Psychiatry: Antidepressants - Serotonin Modulator Passed - 04/05/2023 11:07  AM      Passed - Valid encounter within last 6 months    Recent Outpatient Visits           2 months ago Primary hypertension   [facility] [place], Megan P, DO   5 months ago Routine general medical examination at a health care facility   [facility] [place], [place] P, DO   1 year ago Need for shingles vaccine   [place] Dorcas Carrow, DO   1 year ago Routine general medical examination at a health care facility   [facility] [place], Megan P, DO   2 years ago COVID-19   [place] [facility] Loura Pardon, MD       Future Appointments             In 2 weeks Laural Benes, Oralia Rud, DO [place] [facility], [facility]

## 2023-04-22 ENCOUNTER — Ambulatory Visit (INDEPENDENT_AMBULATORY_CARE_PROVIDER_SITE_OTHER): Payer: Managed Care, Other (non HMO) | Admitting: Family Medicine

## 2023-04-22 ENCOUNTER — Ambulatory Visit
Admission: RE | Admit: 2023-04-22 | Discharge: 2023-04-22 | Disposition: A | Discharge status: Home / Self Care | Payer: Managed Care, Other (non HMO) | Source: Ambulatory Visit | Attending: Family Medicine

## 2023-04-22 ENCOUNTER — Encounter: Payer: Self-pay | Admitting: Family Medicine

## 2023-04-22 ENCOUNTER — Ambulatory Visit
Admission: RE | Admit: 2023-04-22 | Discharge: 2023-04-22 | Disposition: A | Discharge status: Home / Self Care | Payer: Managed Care, Other (non HMO) | Attending: Family Medicine | Admitting: Family Medicine

## 2023-04-22 VITALS — BP 130/82 | HR 74 | Ht 69.0 in | Wt 157.4 lb

## 2023-04-22 DIAGNOSIS — R200 Anesthesia of skin: Secondary | ICD-10-CM | POA: Insufficient documentation

## 2023-04-22 DIAGNOSIS — G4700 Insomnia, unspecified: Secondary | ICD-10-CM

## 2023-04-22 DIAGNOSIS — N529 Male erectile dysfunction, unspecified: Secondary | ICD-10-CM | POA: Insufficient documentation

## 2023-04-22 DIAGNOSIS — I10 Essential (primary) hypertension: Secondary | ICD-10-CM | POA: Diagnosis not present

## 2023-04-22 DIAGNOSIS — K432 Incisional hernia without obstruction or gangrene: Secondary | ICD-10-CM | POA: Diagnosis not present

## 2023-04-22 MED ORDER — FEXOFENADINE HCL 180 MG PO TABS: 180.0000 mg | ORAL_TABLET | Freq: Every day | ORAL | 1 refills | Status: DC

## 2023-04-22 MED ORDER — TADALAFIL 20 MG PO TABS
10.0000 mg | ORAL_TABLET | ORAL | 11 refills | Status: DC | PRN
Start: 2023-04-22 — End: 2023-11-19

## 2023-04-22 MED ORDER — LISINOPRIL 10 MG PO TABS: 10.0000 mg | ORAL_TABLET | Freq: Every day | ORAL | 1 refills | Status: DC

## 2023-04-22 MED ORDER — TRAZODONE HCL 100 MG PO TABS: 100.0000 mg | ORAL_TABLET | Freq: Every evening | ORAL | 1 refills | Status: DC | PRN

## 2023-04-22 MED ORDER — MONTELUKAST SODIUM 10 MG PO TABS: ORAL_TABLET | ORAL | 1 refills | Status: DC

## 2023-04-22 MED ORDER — NAPROXEN 500 MG PO TABS: 500.0000 mg | ORAL_TABLET | Freq: Two times a day (BID) | ORAL | 1 refills | Status: DC

## 2023-04-22 NOTE — Assessment & Plan Note (Signed)
Under good control on current regimen. Continue current regimen. Continue to monitor. Call with any concerns. Refills given.   

## 2023-04-22 NOTE — Progress Notes (Signed)
BP 130/82   Pulse 74   Ht 5\' 9"  (1.753 m)   Wt 157 lb 6.4 oz (71.4 kg)   SpO2 98%   BMI 23.24 kg/m    Subjective:    Patient ID: Edward Mckenzie, male    DOB: July 29, 1971, 52 y.o.   MRN: [id number]  HPI: JOHNTAVIUS BRODIN is a 52 y.o. male  Chief Complaint  Patient presents with   Hypertension   Insomnia   Numbness    Patient says he is having numbness in his R hand. Patient says the issues comes and goes. Patient says when he wakes up is when he feels it the most and says he feels it going numb throughout the night.    Hernia   HERNIA Duration: 2 weeks Location:  scar from his previous hernia surgery Painful: yes- with hitting bumps Discomfort: yes Bulge: yes Quality:  "pain" Onset: sudden Severity: severe Context: worse Aggravating factors: hitting bumps when he's driving  NUMBNESS Duration: couple of months Onset: gradual Location: whole R arm, mainly in his R hand Bilateral: no Symmetric: no Decreased sensation: yes  Weakness: yes Pain: no Quality:  numb and tingling Severity: severe  Frequency: intermittent Trauma: no Recent illness: no Diabetes: no Thyroid disease: no  HIV: no  Alcoholism: no  Spinal cord injury: no Status: worse  HYPERTENSION  Hypertension status: controlled  Satisfied with current treatment? yes Duration of hypertension: chronic BP monitoring frequency:  not checking BP medication side effects:  no Medication compliance: excellent compliance Previous BP meds:lisinopril Aspirin: no Recurrent headaches: no Visual changes: no Palpitations: no Dyspnea: no Chest pain: no Lower extremity edema: no Dizzy/lightheaded: no   Relevant past medical, surgical, family and social history reviewed and updated as indicated. Interim medical history since our last visit reviewed. Allergies and medications reviewed and updated.  Review of Systems  Constitutional: Negative.   Respiratory: Negative.    Cardiovascular:  Negative.   Gastrointestinal:  Positive for abdominal pain. Negative for abdominal distention, anal bleeding, blood in stool, constipation, diarrhea, nausea, rectal pain and vomiting.  Musculoskeletal: Negative.   Skin: Negative.   Neurological:  Positive for weakness and numbness. Negative for dizziness, tremors, seizures, syncope, facial asymmetry, speech difficulty, light-headedness and headaches.  Psychiatric/Behavioral: Negative.      Per HPI unless specifically indicated above     Objective:    BP 130/82   Pulse 74   Ht 5\' 9"  (1.753 m)   Wt 157 lb 6.4 oz (71.4 kg)   SpO2 98%   BMI 23.24 kg/m   Wt Readings from Last 3 Encounters:  04/22/23 157 lb 6.4 oz (71.4 kg)  10/16/22 171 lb 9.6 oz (77.8 kg)  01/28/22 181 lb (82.1 kg)    Physical Exam Vitals and nursing note reviewed.  Constitutional:      General: He is not in acute distress.    Appearance: Normal appearance. He is not ill-appearing, toxic-appearing or diaphoretic.  HENT:     Head: Normocephalic and atraumatic.     Right Ear: External ear normal.     Left Ear: External ear normal.     Nose: Nose normal.     Mouth/Throat:     Mouth: Mucous membranes are moist.     Pharynx: Oropharynx is clear.  Eyes:     General: No scleral icterus.       Right eye: No discharge.        Left eye: No discharge.     Extraocular Movements: Extraocular  movements intact.     Conjunctiva/sclera: Conjunctivae normal.     Pupils: Pupils are equal, round, and reactive to light.  Cardiovascular:     Rate and Rhythm: Normal rate and regular rhythm.     Pulses: Normal pulses.     Heart sounds: Normal heart sounds. No murmur heard.    No friction rub. No gallop.  Pulmonary:     Effort: Pulmonary effort is normal. No respiratory distress.     Breath sounds: Normal breath sounds. No stridor. No wheezing, rhonchi or rales.  Chest:     Chest wall: No tenderness.  Musculoskeletal:        General: Normal range of motion.     Cervical  back: Normal range of motion and neck supple.  Skin:    General: Skin is warm and dry.     Capillary Refill: Capillary refill takes less than 2 seconds.     Coloration: Skin is not jaundiced or pale.     Findings: No bruising, erythema, lesion or rash.  Neurological:     General: No focal deficit present.     Mental Status: He is alert and oriented to person, place, and time. Mental status is at baseline.  Psychiatric:        Mood and Affect: Mood normal.        Behavior: Behavior normal.        Thought Content: Thought content normal.        Judgment: Judgment normal.     Results for orders placed or performed in visit on 10/16/22  Microscopic Examination   Urine  Result Value Ref Range   WBC, UA 0-5 0 - 5 /hpf   RBC, Urine None seen 0 - 2 /hpf   Epithelial Cells (non renal) 0-10 0 - 10 /hpf   Bacteria, UA None seen None seen/Few  Comprehensive metabolic panel  Result Value Ref Range   Glucose 117 (H) 70 - 99 mg/dL   BUN 12 6 - 24 mg/dL   Creatinine, Ser 4.69 (L) 0.76 - 1.27 mg/dL   eGFR 629 >52 WU/XLK/4.40   BUN/Creatinine Ratio 16 9 - 20   Sodium 134 134 - 144 mmol/L   Potassium 4.6 3.5 - 5.2 mmol/L   Chloride 97 96 - 106 mmol/L   CO2 22 20 - 29 mmol/L   Calcium 9.6 8.7 - 10.2 mg/dL   Total Protein 7.4 6.0 - 8.5 g/dL   Albumin 5.0 (H) 3.8 - 4.9 g/dL   Globulin, Total 2.4 1.5 - 4.5 g/dL   Albumin/Globulin Ratio 2.1 1.2 - 2.2   Bilirubin Total 0.4 0.0 - 1.2 mg/dL   Alkaline Phosphatase 65 44 - 121 IU/L   AST 25 0 - 40 IU/L   ALT 26 0 - 44 IU/L  CBC with Differential/Platelet  Result Value Ref Range   WBC 5.4 3.4 - 10.8 x10E3/uL   RBC 4.63 4.14 - 5.80 x10E6/uL   Hemoglobin 16.1 13.0 - 17.7 g/dL   Hematocrit 10.2 72.5 - 51.0 %   MCV 103 (H) 79 - 97 fL   MCH 34.8 (H) 26.6 - 33.0 pg   MCHC 33.8 31.5 - 35.7 g/dL   RDW 36.6 44.0 - 34.7 %   Platelets 228 150 - 450 x10E3/uL   Neutrophils 61 Not Estab. %   Lymphs 29 Not Estab. %   Monocytes 8 Not Estab. %   Eos 2  Not Estab. %   Basos 0 Not Estab. %   Neutrophils Absolute  3.2 1.4 - 7.0 x10E3/uL   Lymphocytes Absolute 1.6 0.7 - 3.1 x10E3/uL   Monocytes Absolute 0.4 0.1 - 0.9 x10E3/uL   EOS (ABSOLUTE) 0.1 0.0 - 0.4 x10E3/uL   Basophils Absolute 0.0 0.0 - 0.2 x10E3/uL   Immature Granulocytes 0 Not Estab. %   Immature Grans (Abs) 0.0 0.0 - 0.1 x10E3/uL  Lipid Panel w/o Chol/HDL Ratio  Result Value Ref Range   Cholesterol, Total 242 (H) 100 - 199 mg/dL   Triglycerides 46 0 - 149 mg/dL   HDL 616 >07 mg/dL   VLDL Cholesterol Cal 7 5 - 40 mg/dL   LDL Chol Calc (NIH) 371 (H) 0 - 99 mg/dL  PSA  Result Value Ref Range   Prostate Specific Ag, Serum 0.8 0.0 - 4.0 ng/mL  TSH  Result Value Ref Range   TSH 0.820 0.450 - 4.500 uIU/mL  Urinalysis, Routine w reflex microscopic  Result Value Ref Range   Specific Gravity, UA 1.015 1.005 - 1.030   pH, UA 6.5 5.0 - 7.5   Color, UA Yellow Yellow   Appearance Ur Clear Clear   Leukocytes,UA Negative Negative   Protein,UA 1+ (A) Negative/Trace   Glucose, UA Negative Negative   Ketones, UA Negative Negative   RBC, UA Negative Negative   Bilirubin, UA Negative Negative   Urobilinogen, Ur 0.2 0.2 - 1.0 mg/dL   Nitrite, UA Negative Negative   Microscopic Examination See below:   Microalbumin, Urine Waived  Result Value Ref Range   Microalb, Ur Waived 80 (H) 0 - 19 mg/L   Creatinine, Urine Waived 50 10 - 300 mg/dL   Microalb/Creat Ratio 30-300 (H) <30 mg/g      Assessment & Plan:   Problem List Items Addressed This Visit       Cardiovascular and Mediastinum   HTN (hypertension)    Under good control on current regimen. Continue current regimen. Continue to monitor. Call with any concerns. Refills given. Labs drawn today.       Relevant Medications   tadalafil (CIALIS) 20 MG tablet   lisinopril (ZESTRIL) 10 MG tablet   Other Relevant Orders   Basic metabolic panel     Other   ED (erectile dysfunction)    Cialis sent to the pharmacy. Call with any  concerns.       Other Visit Diagnoses     Incisional hernia, without obstruction or gangrene    -  Primary   Will refer back to general surgery. Await their input.   Relevant Orders   Ambulatory referral to General Surgery   Right arm numbness       Concern for cervical radiculopathy. Will start stretches and obtain x-rays. Await results. Treat as needed.   Relevant Orders   DG Cervical Spine Complete   DG Shoulder Right        Follow up plan: Return in about 6 weeks (around 06/03/2023) for follow up arm numbness.

## 2023-04-22 NOTE — Assessment & Plan Note (Signed)
Cialis sent to the pharmacy. Call with any concerns.

## 2023-04-22 NOTE — Assessment & Plan Note (Signed)
Under good control on current regimen. Continue current regimen. Continue to monitor. Call with any concerns. Refills given. Labs drawn today.   

## 2023-04-23 LAB — BASIC METABOLIC PANEL
BUN/Creatinine Ratio: 12 (ref 9–20)
BUN: 9 mg/dL (ref 6–24)
CO2: 22 mmol/L (ref 20–29)
Calcium: 9.6 mg/dL (ref 8.7–10.2)
Chloride: 102 mmol/L (ref 96–106)
Creatinine, Ser: 0.73 mg/dL — ABNORMAL LOW (ref 0.76–1.27)
Glucose: 135 mg/dL — ABNORMAL HIGH (ref 70–99)
Potassium: 4.3 mmol/L (ref 3.5–5.2)
Sodium: 137 mmol/L (ref 134–144)
eGFR: 110 mL/min/{1.73_m2} (ref >59)

## 2023-04-27 ENCOUNTER — Encounter: Payer: Self-pay | Admitting: Family Medicine

## 2023-05-03 ENCOUNTER — Other Ambulatory Visit: Payer: Self-pay | Admitting: Family Medicine

## 2023-05-03 DIAGNOSIS — M5412 Radiculopathy, cervical region: Secondary | ICD-10-CM

## 2023-05-04 ENCOUNTER — Encounter: Payer: Self-pay | Admitting: Surgery

## 2023-05-04 ENCOUNTER — Ambulatory Visit (INDEPENDENT_AMBULATORY_CARE_PROVIDER_SITE_OTHER): Payer: Managed Care, Other (non HMO) | Admitting: Surgery

## 2023-05-04 ENCOUNTER — Other Ambulatory Visit: Payer: Self-pay | Admitting: Family Medicine

## 2023-05-04 VITALS — BP 133/79 | HR 89 | Temp 98.0°F | Ht 69.0 in | Wt 168.0 lb

## 2023-05-04 DIAGNOSIS — K432 Incisional hernia without obstruction or gangrene: Secondary | ICD-10-CM | POA: Diagnosis not present

## 2023-05-04 DIAGNOSIS — M5412 Radiculopathy, cervical region: Secondary | ICD-10-CM

## 2023-05-04 DIAGNOSIS — K439 Ventral hernia without obstruction or gangrene: Secondary | ICD-10-CM

## 2023-05-04 NOTE — Patient Instructions (Signed)
You have requested to have a Ventral Hernia Repair. This will be done by Dr Everlene Farrier at [facility]. Please see your (BLUE) Pre-care sheet for more information. Our surgery scheduler will call you to look at surgery dates and to go over surgery information.   You will need to arrange to be out of work for approximately 1-2 weeks and then you may return with a lifting restriction for 4 more weeks. If you have FMLA or Disability paperwork that needs to be filled out, please have your company fax your paperwork to [phone] or you may drop this by either office. This paperwork will be filled out within 3 days after your surgery has been completed.     Ventral Hernia A ventral hernia (also called an incisional hernia) is a hernia that occurs at the site of a previous surgical cut (incision) in the abdomen. The abdominal wall spans from your lower chest down to your pelvis. If the abdominal wall is weakened from a surgical incision, a hernia can occur. A hernia is a bulge of bowel or muscle tissue pushing out on the weakened part of the abdominal wall. Ventral hernias can get bigger from straining or lifting. Obese and older people are at higher risk for a ventral hernia. People who develop infections after surgery or require repeat incisions at the same site on the abdomen are also at increased risk. CAUSES  A ventral hernia occurs because of weakness in the abdominal wall at an incision site.  SYMPTOMS  Common symptoms include: A visible bulge or lump on the abdominal wall. Pain or tenderness around the lump. Increased discomfort if you cough or make a sudden movement. If the hernia has blocked part of the intestine, a serious complication can occur (incarcerated or strangulated hernia). This can become a problem that requires emergency surgery because the blood flow to the blocked intestine may be cut off. Symptoms may include: Feeling sick to your stomach (nauseous). Throwing up (vomiting). Stomach  swelling (distention) or bloating. Fever. Rapid heartbeat. DIAGNOSIS  Your health care provider will take a medical history and perform a physical exam. Various tests may be ordered, such as: Blood tests. Urine tests. Ultrasonography. X-rays. Computed tomography (CT). TREATMENT  Watchful waiting may be all that is needed for a smaller hernia that does not cause symptoms. Your health care provider may recommend the use of a supportive belt (truss) that helps to keep the abdominal wall intact. For larger hernias or those that cause pain, surgery to repair the hernia is usually recommended. If a hernia becomes strangulated, emergency surgery needs to be done right away. HOME CARE INSTRUCTIONS Avoid putting pressure or strain on the abdominal area. Avoid heavy lifting. Use good body positioning for physical tasks. Ask your health care provider about proper body positioning. Use a supportive belt as directed by your health care provider. Maintain a healthy weight. Eat foods that are high in fiber, such as whole grains, fruits, and vegetables. Fiber helps prevent difficult bowel movements (constipation). Drink enough fluids to keep your urine clear or pale yellow. Follow up with your health care provider as directed. SEEK MEDICAL CARE IF:  Your hernia seems to be getting larger or more painful. SEEK IMMEDIATE MEDICAL CARE IF:  You have abdominal pain that is sudden and sharp. Your pain becomes severe. You have repeated vomiting. You are sweating a lot. You notice a rapid heartbeat. You develop a fever. MAKE SURE YOU:  Understand these instructions. Will watch your condition. Will get help  right away if you are not doing well or get worse.     Open Ventral Hernia Repair Open ventral hernia repair is a surgery to fix a ventral hernia. A ventral hernia,  is a bulge of body tissue or intestines that pushes through the front part of the abdomen. This can happen if the connective tissue  covering the muscles over the abdomen has a weak spot or is torn because of a surgical cut (incision) from a previous surgery. A ventral hernia repair is often done soon after diagnosis to stop the hernia from getting bigger, becoming uncomfortable, or becoming an emergency. This surgery usually takes about 2 hours, but the time can vary greatly.  LET [facility] CARE PROVIDER KNOW ABOUT: Any allergies you have. All medicines you are taking, including steroids, vitamins, herbs, eye drops, creams, and over-the-counter medicines. Previous problems you or members of your family have had with the use of anesthetics. Any blood disorders you have. Previous surgeries you have had. Medical conditions you have.  RISKS AND COMPLICATIONS  Generally, Open ventral hernia repair is a safe procedure. However, as with any surgical procedure, problems can occur. Possible problems include: Bleeding. Trouble passing urine or having a bowel movement after the surgery. Infection. Pneumonia. Blood clots. Pain in the area of the hernia. A bulge in the area of the hernia that may be caused by a collection of fluid. Injury to intestines or other structures in the abdomen. Return of the hernia after surgery.  BEFORE THE PROCEDURE  You may need to have blood tests, urine tests, a chest X-ray, or an electrocardiogram done before the day of the surgery. Ask your health care provider about changing or stopping your regular medicines. This is especially important if you are taking diabetes medicines or blood thinners. You may need to wash with a special type of germ-killing soap. Do not eat or drink anything after midnight the night before the procedure or as directed by your health care provider. Make plans to have someone drive you home after the procedure.  PROCEDURE  Small monitors will be put on your body. They are used to check your heart, blood pressure, and oxygen level. An IV access tube will be put into a  vein in your hand or arm. Fluids and medicine will flow directly into your body through the IV tube. You will be given medicine that makes you go to sleep (general anesthetic). Your abdomen will be cleaned with a special soap to kill any germs on your skin. Once you are asleep, a moderate - large size incision will be made in your abdomen. The size of incision depends on how large your hernia is. Your surgeon puts the tissue or intestines that formed the hernia back in place. A screen-like patch (mesh) is used to close the hernia. This helps make the area stronger. Stitches, tacks, or staples are used to keep the mesh in place. Medicine and a bandage (dressing) or skin glue will be put over the incision.  AFTER THE PROCEDURE  You will stay in a recovery area until the anesthetic wears off. Your blood pressure and pulse will be checked often. You may be able to go home the same day or may need to stay in the hospital for 1-2 days after surgery. Your surgeon will decide when you can go home depending upon your recovery. You may feel some pain. You will be given medicine for pain. You will be urged to do breathing exercises that involve  taking deep breaths. This helps prevent a lung infection after a surgery. You may have to wear compression stockings while you are in the hospital. These stockings help keep blood clots from forming in your legs.   This information is not intended to replace advice given to you by your health care provider. Make sure you discuss any questions you have with your health care provider.   Document Released: 07/14/2012 Document Revised: 08/02/2013 Document Reviewed: 07/14/2012 Elsevier Interactive Patient Education [employer]

## 2023-05-05 ENCOUNTER — Telehealth: Payer: Self-pay | Admitting: Surgery

## 2023-05-05 NOTE — Telephone Encounter (Signed)
Patient has been advised of Pre-Admission date/time, and Surgery date at [facility].  Surgery Date: 05/14/23  Preadmission Testing Date: 05/07/23 (phone 8a-1p)  Patient has been made aware to call [phone], between 1-3:00pm the day before surgery, to find out what time to arrive for surgery.

## 2023-05-06 NOTE — Progress Notes (Signed)
Patient ID: Edward Mckenzie, male   DOB: [birth year], 52 y.o.   MRN: [id number]  HPI Edward Mckenzie is a 52 y.o. male seen at the request of Dr. Laural Benes.  He has noticed a bulge over the last few months.  He noticed that his next to prior incision site.  Of note he did have a history of a robotic bilateral inguinal hernia repair by me 3 years and 4 months ago.  Regarding the inguinal area he endorses no bulging or pain.  Regarding the new bulge in the epigastric region he does experience some discomfort that is mild and intermittent dull type pain.  No evidence of incarceration or strangulation.  No bowel symptoms.  He continues to smoke.  Denies any fevers any chills.  He is still very active.  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  Dr. Elenore Rota. [facility]   ORIF TOE FRACTURE Left 12/20/2015   Procedure: OPEN REDUCTION INTERNAL FIXATION (ORIF) 5TH METATARSAL (TOE) FRACTURE LEFT;  Surgeon: Recardo Evangelist, DPM;  Location: [facility];  Service: Podiatry;  Laterality: Left;  LMA WITH POPLITEAL   TONSILLECTOMY AND ADENOIDECTOMY     UVULOPALATOPHARYNGOPLASTY     XI ROBOTIC ASSISTED INGUINAL HERNIA REPAIR WITH MESH Bilateral 01/06/2020   Procedure: XI ROBOTIC ASSISTED INGUINAL HERNIA REPAIR WITH MESH;  Surgeon: Leafy Ro, MD;  Location: [facility];  Service: General;  Laterality: Bilateral;    Family History  Problem Relation Age of Onset   Heart disease Father    Diabetes Paternal  Grandfather    Heart disease Paternal Grandfather    Cancer Paternal Grandfather        unknown location   Breast cancer Neg Hx    Colon cancer Neg Hx     Social History Social History   Tobacco Use   Smoking status: Every Day    Current packs/day: 1.50    Average packs/day: 1.5 packs/day for 15.0 years (22.5 ttl pk-yrs)    Types: Cigarettes    Passive exposure: Past   Smokeless tobacco: Former  Building services engineer status: Never Used  Substance Use Topics   Alcohol use: Not Currently    Alcohol/week: 0.0 standard drinks of alcohol   Drug use: No    No Known Allergies  Current Outpatient Medications  Medication Sig Dispense Refill   Ascorbic Acid (VITAMIN C) 100 MG tablet Take 100 mg by mouth daily.     cyclobenzaprine (FLEXERIL) 10 MG tablet Take 1 tablet (10 mg total) by mouth at bedtime as needed for muscle spasms. 30 tablet 0   fexofenadine (ALLEGRA ALLERGY) 180 MG tablet Take 1 tablet (180 mg total) by mouth daily. 100 tablet 1   lisinopril (ZESTRIL) 10 MG tablet Take 1 tablet (10 mg total) by mouth daily. 90 tablet 1   metroNIDAZOLE (METROGEL) 1 % gel Apply topically  daily. 60 g 12   montelukast (SINGULAIR) 10 MG tablet TAKE 1 TABLET BY MOUTH EVERYDAY AT BEDTIME 90 tablet 1   Multiple Vitamin (ONE-A-DAY MENS PO) Take 1 tablet by mouth daily.      Multiple Vitamins-Minerals (ZINC PO) Take 1 tablet by mouth daily.      naproxen (NAPROSYN) 500 MG tablet Take 1 tablet (500 mg total) by mouth 2 (two) times daily with a meal. 180 tablet 1   Omega-3 Fatty Acids (FISH OIL) 1000 MG CAPS Take 1,000 mg by mouth daily.     tadalafil (CIALIS) 20 MG tablet Take 0.5-1 tablets (10-20 mg total) by mouth every other day as needed for erectile dysfunction. 10 tablet 11   traZODone (DESYREL) 100 MG tablet Take 1 tablet (100 mg total) by mouth at bedtime as needed. for sleep 90 tablet 1   triamcinolone ointment (KENALOG) 0.5 % Apply 1 application topically 2 (two) times daily. 30 g 3    VITAMIN D PO Take 1 tablet by mouth daily.      No current facility-administered medications for this visit.     Review of Systems Full ROS  was asked and was negative except for the information on the HPI  Physical Exam Blood pressure 133/79, pulse 89, temperature 98 F (36.7 C), height 5\' 9"  (1.753 m), weight 168 lb (76.2 kg), SpO2 97%. CONSTITUTIONAL: NAD. EYES: Pupils are equal, round, Sclera are non-icteric. EARS, NOSE, MOUTH AND THROAT: The oropharynx is clear. The oral mucosa is pink and moist. Hearing is intact to voice. LYMPH NODES:  Lymph nodes in the neck are normal. RESPIRATORY:  Lungs are clear. There is normal respiratory effort, with equal breath sounds bilaterally, and without pathologic use of accessory muscles. CARDIOVASCULAR: Heart is regular without murmurs, gallops, or rubs. GI: The abdomen is  soft, nontender, and nondistended. Reducible ventral hernia prior incision site epigastric 3 cms. There are no palpable masses. There is no hepatosplenomegaly. There are normal bowel sounds GU: Rectal deferred.   MUSCULOSKELETAL: Normal muscle strength and tone. No cyanosis or edema.   SKIN: Turgor is good and there are no pathologic skin lesions or ulcers. NEUROLOGIC: Motor and sensation is grossly normal. Cranial nerves are grossly intact. PSYCH:  Oriented to person, place and time. Affect is normal.  Data Reviewed  I have personally reviewed the patient's imaging, laboratory findings and medical records.    Assessment/Plan 52 year old male with incisional hernia that is symptomatic.  Discussed with the patient in detail about his disease process.  I definitely recommend repair.  I do think that his mall and off that we can do an open repair and reinforce it with an Ventralex mesh.  Procedure discussed with the patient in detail.  Risks, benefits and possible occasions including but not limited to: Bleeding, infection injury to adjacent organs, bowel injuries, recurrence and  chronic pain.  He understands and wished to proceed.  We will go ahead and feed him on our schedule according to his availability. A copy of this report was sent to the referring provider Please note that I spent 45 min in this encounter including coordination of care, reviewing of medical records, placing orders, counseling the pt and performing documentation  Sterling Big, MD FACS General Surgeon 05/06/2023, 5:45 PM

## 2023-05-06 NOTE — H&P (View-Only) (Signed)
Patient ID: Edward Mckenzie, male   DOB: [birth year], 52 y.o.   MRN: [id number]  HPI Edward Mckenzie is a 52 y.o. male seen at the request of Dr. Laural Benes.  He has noticed a bulge over the last few months.  He noticed that his next to prior incision site.  Of note he did have a history of a robotic bilateral inguinal hernia repair by me 3 years and 4 months ago.  Regarding the inguinal area he endorses no bulging or pain.  Regarding the new bulge in the epigastric region he does experience some discomfort that is mild and intermittent dull type pain.  No evidence of incarceration or strangulation.  No bowel symptoms.  He continues to smoke.  Denies any fevers any chills.  He is still very active.  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  Dr. Elenore Rota. [facility]   ORIF TOE FRACTURE Left 12/20/2015   Procedure: OPEN REDUCTION INTERNAL FIXATION (ORIF) 5TH METATARSAL (TOE) FRACTURE LEFT;  Surgeon: Recardo Evangelist, DPM;  Location: [facility];  Service: Podiatry;  Laterality: Left;  LMA WITH POPLITEAL   TONSILLECTOMY AND ADENOIDECTOMY     UVULOPALATOPHARYNGOPLASTY     XI ROBOTIC ASSISTED INGUINAL HERNIA REPAIR WITH MESH Bilateral 01/06/2020   Procedure: XI ROBOTIC ASSISTED INGUINAL HERNIA REPAIR WITH MESH;  Surgeon: Leafy Ro, MD;  Location: [facility];  Service: General;  Laterality: Bilateral;    Family History  Problem Relation Age of Onset   Heart disease Father    Diabetes Paternal  Grandfather    Heart disease Paternal Grandfather    Cancer Paternal Grandfather        unknown location   Breast cancer Neg Hx    Colon cancer Neg Hx     Social History Social History   Tobacco Use   Smoking status: Every Day    Current packs/day: 1.50    Average packs/day: 1.5 packs/day for 15.0 years (22.5 ttl pk-yrs)    Types: Cigarettes    Passive exposure: Past   Smokeless tobacco: Former  Building services engineer status: Never Used  Substance Use Topics   Alcohol use: Not Currently    Alcohol/week: 0.0 standard drinks of alcohol   Drug use: No    No Known Allergies  Current Outpatient Medications  Medication Sig Dispense Refill   Ascorbic Acid (VITAMIN C) 100 MG tablet Take 100 mg by mouth daily.     cyclobenzaprine (FLEXERIL) 10 MG tablet Take 1 tablet (10 mg total) by mouth at bedtime as needed for muscle spasms. 30 tablet 0   fexofenadine (ALLEGRA ALLERGY) 180 MG tablet Take 1 tablet (180 mg total) by mouth daily. 100 tablet 1   lisinopril (ZESTRIL) 10 MG tablet Take 1 tablet (10 mg total) by mouth daily. 90 tablet 1   metroNIDAZOLE (METROGEL) 1 % gel Apply topically  daily. 60 g 12   montelukast (SINGULAIR) 10 MG tablet TAKE 1 TABLET BY MOUTH EVERYDAY AT BEDTIME 90 tablet 1   Multiple Vitamin (ONE-A-DAY MENS PO) Take 1 tablet by mouth daily.      Multiple Vitamins-Minerals (ZINC PO) Take 1 tablet by mouth daily.      naproxen (NAPROSYN) 500 MG tablet Take 1 tablet (500 mg total) by mouth 2 (two) times daily with a meal. 180 tablet 1   Omega-3 Fatty Acids (FISH OIL) 1000 MG CAPS Take 1,000 mg by mouth daily.     tadalafil (CIALIS) 20 MG tablet Take 0.5-1 tablets (10-20 mg total) by mouth every other day as needed for erectile dysfunction. 10 tablet 11   traZODone (DESYREL) 100 MG tablet Take 1 tablet (100 mg total) by mouth at bedtime as needed. for sleep 90 tablet 1   triamcinolone ointment (KENALOG) 0.5 % Apply 1 application topically 2 (two) times daily. 30 g 3    VITAMIN D PO Take 1 tablet by mouth daily.      No current facility-administered medications for this visit.     Review of Systems Full ROS  was asked and was negative except for the information on the HPI  Physical Exam Blood pressure 133/79, pulse 89, temperature 98 F (36.7 C), height 5\' 9"  (1.753 m), weight 168 lb (76.2 kg), SpO2 97%. CONSTITUTIONAL: NAD. EYES: Pupils are equal, round, Sclera are non-icteric. EARS, NOSE, MOUTH AND THROAT: The oropharynx is clear. The oral mucosa is pink and moist. Hearing is intact to voice. LYMPH NODES:  Lymph nodes in the neck are normal. RESPIRATORY:  Lungs are clear. There is normal respiratory effort, with equal breath sounds bilaterally, and without pathologic use of accessory muscles. CARDIOVASCULAR: Heart is regular without murmurs, gallops, or rubs. GI: The abdomen is  soft, nontender, and nondistended. Reducible ventral hernia prior incision site epigastric 3 cms. There are no palpable masses. There is no hepatosplenomegaly. There are normal bowel sounds GU: Rectal deferred.   MUSCULOSKELETAL: Normal muscle strength and tone. No cyanosis or edema.   SKIN: Turgor is good and there are no pathologic skin lesions or ulcers. NEUROLOGIC: Motor and sensation is grossly normal. Cranial nerves are grossly intact. PSYCH:  Oriented to person, place and time. Affect is normal.  Data Reviewed  I have personally reviewed the patient's imaging, laboratory findings and medical records.    Assessment/Plan 52 year old male with incisional hernia that is symptomatic.  Discussed with the patient in detail about his disease process.  I definitely recommend repair.  I do think that his mall and off that we can do an open repair and reinforce it with an Ventralex mesh.  Procedure discussed with the patient in detail.  Risks, benefits and possible occasions including but not limited to: Bleeding, infection injury to adjacent organs, bowel injuries, recurrence and  chronic pain.  He understands and wished to proceed.  We will go ahead and feed him on our schedule according to his availability. A copy of this report was sent to the referring provider Please note that I spent 45 min in this encounter including coordination of care, reviewing of medical records, placing orders, counseling the pt and performing documentation  Sterling Big, MD FACS General Surgeon 05/06/2023, 5:45 PM

## 2023-05-07 ENCOUNTER — Encounter
Admission: RE | Admit: 2023-05-07 | Discharge: 2023-05-07 | Disposition: A | Discharge status: Home / Self Care | Payer: Managed Care, Other (non HMO) | Source: Ambulatory Visit | Attending: Surgery | Admitting: Surgery

## 2023-05-07 DIAGNOSIS — I10 Essential (primary) hypertension: Secondary | ICD-10-CM

## 2023-05-07 DIAGNOSIS — Z01810 Encounter for preprocedural cardiovascular examination: Secondary | ICD-10-CM

## 2023-05-07 HISTORY — DX: Ventral hernia without obstruction or gangrene: K43.9

## 2023-05-07 HISTORY — DX: Sleep apnea, unspecified: G47.30

## 2023-05-07 HISTORY — DX: Insomnia, unspecified: G47.00

## 2023-05-07 HISTORY — DX: Male erectile dysfunction, unspecified: N52.9

## 2023-05-07 HISTORY — DX: Bilateral inguinal hernia, without obstruction or gangrene, not specified as recurrent: K40.20

## 2023-05-07 HISTORY — DX: Gastro-esophageal reflux disease without esophagitis: K21.9

## 2023-05-07 HISTORY — DX: Tobacco use: Z72.0

## 2023-05-07 HISTORY — DX: Rosacea, unspecified: L71.9

## 2023-05-07 NOTE — Patient Instructions (Addendum)
Your procedure is scheduled on:05-14-23 Thursday Report to the Registration Desk on the 1st floor of the [facility].Then proceed to the 2nd floor Surgery Desk To find out your arrival time, please call [phone] between 1PM - 3PM on:05-13-23 Wednesday If your arrival time is 6:00 am, do not arrive before that time as the [facility] entrance doors do not open until 6:00 am.  REMEMBER: Instructions that are not followed completely may result in serious medical risk, up to and including death; or upon the discretion of your surgeon and anesthesiologist your surgery may need to be rescheduled.  Do not eat food after midnight the night before surgery.  No gum chewing or hard candies.  You may however, drink CLEAR liquids up to 2 hours before you are scheduled to arrive for your surgery. Do not drink anything within 2 hours of your scheduled arrival time.  Clear liquids include: - water  - apple juice without pulp - gatorade (not RED colors) - black coffee or tea (Do NOT add milk or creamers to the coffee or tea) Do NOT drink anything that is not on this list.  One week prior to surgery:Last dose now 05-07-23) Stop Anti-inflammatories (NSAIDS) such as Advil, Aleve, Ibuprofen, Motrin, Naproxen, Naprosyn and Aspirin based products such as Excedrin, Goody's Powder, BC Powder.You may however, take Tylenol if needed for pain up until the day of surgery. Stop ANY OVER THE COUNTER supplements/vitamins NOW (05-07-23) until after surgery (vitamin c)   Continue taking all prescribed medications with the exception of the following: -tadalafil (CIALIS)-Stop 2 days prior to surgery-Last dose will be on 05-11-23 Monday   TAKE ONLY THESE MEDICATIONS THE MORNING OF SURGERY WITH A SIP OF WATER: -montelukast (SINGULAIR)   No Alcohol for 24 hours before or after surgery.  No Smoking including e-cigarettes for 24 hours before surgery.  No chewable tobacco products for at least 6 hours before surgery.   No nicotine patches on the day of surgery.  Do not use any "recreational" drugs for at least a week (preferably 2 weeks) before your surgery.  Please be advised that the combination of cocaine and anesthesia may have negative outcomes, up to and including death. If you test positive for cocaine, your surgery will be cancelled.  On the morning of surgery brush your teeth with toothpaste and water, you may rinse your mouth with mouthwash if you wish. Do not swallow any toothpaste or mouthwash.  Use CHG Soap as directed on instruction sheet.  Do not wear jewelry, make-up, hairpins, clips or nail polish.  For welded (permanent) jewelry: bracelets, anklets, waist bands, etc.  Please have this removed prior to surgery.  If it is not removed, there is a chance that hospital personnel will need to cut it off on the day of surgery.  Do not wear lotions, powders, or perfumes.   Do not shave body hair from the neck down 48 hours before surgery.  Contact lenses, hearing aids and dentures may not be worn into surgery.  Do not bring valuables to the hospital. [facility] is not responsible for any missing/lost belongings or valuables.   Notify your doctor if there is any change in your medical condition (cold, fever, infection).  Wear comfortable clothing (specific to your surgery type) to the hospital.  After surgery, you can help prevent lung complications by doing breathing exercises.  Take deep breaths and cough every 1-2 hours. Your doctor may order a device called an Incentive Spirometer to help you take  deep breaths. When coughing or sneezing, hold a pillow firmly against your incision with both hands. This is called "splinting." Doing this helps protect your incision. It also decreases belly discomfort.  If you are being admitted to the hospital overnight, leave your suitcase in the car. After surgery it may be brought to your room.  In case of increased patient census, it may be  necessary for you, the patient, to continue your postoperative care in the Same Day Surgery department.  If you are being discharged the day of surgery, you will not be allowed to drive home. You will need a responsible individual to drive you home and stay with you for 24 hours after surgery.   If you are taking public transportation, you will need to have a responsible individual with you.  Please call the Pre-admissions Testing Dept. at [phone] if you have any questions about these instructions.  Surgery Visitation Policy:  Patients having surgery or a procedure may have two visitors.  Children under the age of 71 must have an adult with them who is not the patient.     Preparing for Surgery with CHLORHEXIDINE GLUCONATE (CHG) Soap  Chlorhexidine Gluconate (CHG) Soap  o An antiseptic cleaner that kills germs and bonds with the skin to continue killing germs even after washing  o Used for showering the night before surgery and morning of surgery  Before surgery, you can play an important role by reducing the number of germs on your skin.  CHG (Chlorhexidine gluconate) soap is an antiseptic cleanser which kills germs and bonds with the skin to continue killing germs even after washing.  Please do not use if you have an allergy to CHG or antibacterial soaps. If your skin becomes reddened/irritated stop using the CHG.  1. Shower the NIGHT BEFORE SURGERY and the MORNING OF SURGERY with CHG soap.  2. If you choose to wash your hair, wash your hair first as usual with your normal shampoo.  3. After shampooing, rinse your hair and body thoroughly to remove the shampoo.  4. Use CHG as you would any other liquid soap. You can apply CHG directly to the skin and wash gently with a scrungie or a clean washcloth.  5. Apply the CHG soap to your body only from the neck down. Do not use on open wounds or open sores. Avoid contact with your eyes, ears, mouth, and genitals (private  parts). Wash face and genitals (private parts) with your normal soap.  6. Wash thoroughly, paying special attention to the area where your surgery will be performed.  7. Thoroughly rinse your body with warm water.  8. Do not shower/wash with your normal soap after using and rinsing off the CHG soap.  9. Pat yourself dry with a clean towel.  10. Wear clean pajamas to bed the night before surgery.  12. Place clean sheets on your bed the night of your first shower and do not sleep with pets.  13. Shower again with the CHG soap on the day of surgery prior to arriving at the hospital.  14. Do not apply any deodorants/lotions/powders.  15. Please wear clean clothes to the hospital.

## 2023-05-08 ENCOUNTER — Encounter
Admission: RE | Admit: 2023-05-08 | Discharge: 2023-05-08 | Disposition: A | Discharge status: Home / Self Care | Payer: Managed Care, Other (non HMO) | Source: Ambulatory Visit | Attending: Surgery | Admitting: Surgery

## 2023-05-08 DIAGNOSIS — I10 Essential (primary) hypertension: Secondary | ICD-10-CM | POA: Diagnosis not present

## 2023-05-08 DIAGNOSIS — Z01810 Encounter for preprocedural cardiovascular examination: Secondary | ICD-10-CM

## 2023-05-08 DIAGNOSIS — Z01818 Encounter for other preprocedural examination: Secondary | ICD-10-CM | POA: Diagnosis present

## 2023-05-14 ENCOUNTER — Encounter
Admission: RE | Disposition: A | Discharge status: Home / Self Care | Payer: Self-pay | Source: Home / Self Care | Attending: Surgery

## 2023-05-14 ENCOUNTER — Other Ambulatory Visit: Payer: Self-pay

## 2023-05-14 ENCOUNTER — Ambulatory Visit
Admission: RE | Admit: 2023-05-14 | Discharge: 2023-05-14 | Disposition: A | Discharge status: Home / Self Care | Payer: Managed Care, Other (non HMO) | Attending: Surgery | Admitting: Surgery

## 2023-05-14 ENCOUNTER — Encounter: Payer: Self-pay | Admitting: Surgery

## 2023-05-14 ENCOUNTER — Ambulatory Visit: Payer: Managed Care, Other (non HMO) | Admitting: Anesthesiology

## 2023-05-14 DIAGNOSIS — I10 Essential (primary) hypertension: Secondary | ICD-10-CM | POA: Diagnosis not present

## 2023-05-14 DIAGNOSIS — K420 Umbilical hernia with obstruction, without gangrene: Secondary | ICD-10-CM | POA: Diagnosis not present

## 2023-05-14 DIAGNOSIS — K439 Ventral hernia without obstruction or gangrene: Secondary | ICD-10-CM

## 2023-05-14 DIAGNOSIS — F17210 Nicotine dependence, cigarettes, uncomplicated: Secondary | ICD-10-CM | POA: Diagnosis not present

## 2023-05-14 DIAGNOSIS — K429 Umbilical hernia without obstruction or gangrene: Secondary | ICD-10-CM | POA: Diagnosis not present

## 2023-05-14 HISTORY — PX: VENTRAL HERNIA REPAIR: SHX424

## 2023-05-14 SURGERY — REPAIR, HERNIA, VENTRAL
Anesthesia: General

## 2023-05-14 MED ORDER — ONDANSETRON HCL 4 MG/2ML IJ SOLN
INTRAMUSCULAR | Status: DC | PRN
  Administered 2023-05-14: 4 mg via INTRAVENOUS

## 2023-05-14 MED ORDER — BUPIVACAINE-EPINEPHRINE (PF) 0.25% -1:200000 IJ SOLN
INTRAMUSCULAR | Status: AC
  Filled 2023-05-14: qty 30

## 2023-05-14 MED ORDER — GABAPENTIN 300 MG PO CAPS
300.0000 mg | ORAL_CAPSULE | ORAL | Status: AC
  Administered 2023-05-14: 300 mg via ORAL

## 2023-05-14 MED ORDER — FENTANYL CITRATE (PF) 100 MCG/2ML IJ SOLN
INTRAMUSCULAR | Status: DC | PRN
  Administered 2023-05-14 (×2): 25 ug via INTRAVENOUS

## 2023-05-14 MED ORDER — PROPOFOL 10 MG/ML IV BOLUS
INTRAVENOUS | Status: DC | PRN
  Administered 2023-05-14: 170 mg via INTRAVENOUS

## 2023-05-14 MED ORDER — ACETAMINOPHEN 500 MG PO TABS
1000.0000 mg | ORAL_TABLET | ORAL | Status: AC
  Administered 2023-05-14: 1000 mg via ORAL

## 2023-05-14 MED ORDER — CEFAZOLIN SODIUM-DEXTROSE 2-4 GM/100ML-% IV SOLN
2.0000 g | INTRAVENOUS | Status: AC
  Administered 2023-05-14: 2 g via INTRAVENOUS

## 2023-05-14 MED ORDER — HYDROCODONE-ACETAMINOPHEN 5-325 MG PO TABS
1.0000 | ORAL_TABLET | ORAL | Status: DC | PRN
  Administered 2023-05-14: 1 via ORAL

## 2023-05-14 MED ORDER — CHLORHEXIDINE GLUCONATE CLOTH 2 % EX PADS: 6.0000 | MEDICATED_PAD | Freq: Once | CUTANEOUS | Status: DC

## 2023-05-14 MED ORDER — MIDAZOLAM HCL 2 MG/2ML IJ SOLN
INTRAMUSCULAR | Status: AC
  Filled 2023-05-14: qty 2

## 2023-05-14 MED ORDER — FENTANYL CITRATE (PF) 100 MCG/2ML IJ SOLN
INTRAMUSCULAR | Status: AC
  Filled 2023-05-14: qty 2

## 2023-05-14 MED ORDER — EPHEDRINE SULFATE-NACL 50-0.9 MG/10ML-% IV SOSY
PREFILLED_SYRINGE | INTRAVENOUS | Status: DC | PRN
Start: 2023-05-14 — End: 2023-05-14
  Administered 2023-05-14: 10 mg via INTRAVENOUS

## 2023-05-14 MED ORDER — DEXAMETHASONE SODIUM PHOSPHATE 10 MG/ML IJ SOLN
INTRAMUSCULAR | Status: AC
  Filled 2023-05-14: qty 1

## 2023-05-14 MED ORDER — ORAL CARE MOUTH RINSE: 15.0000 mL | Freq: Once | OROMUCOSAL | Status: AC

## 2023-05-14 MED ORDER — DROPERIDOL 2.5 MG/ML IJ SOLN: 0.6250 mg | Freq: Once | INTRAMUSCULAR | Status: DC | PRN

## 2023-05-14 MED ORDER — HYDROCODONE-ACETAMINOPHEN 5-325 MG PO TABS: 1.0000 | ORAL_TABLET | ORAL | 0 refills | Status: DC | PRN

## 2023-05-14 MED ORDER — PROPOFOL 10 MG/ML IV BOLUS
INTRAVENOUS | Status: AC
  Filled 2023-05-14: qty 40

## 2023-05-14 MED ORDER — HYDROCODONE-ACETAMINOPHEN 5-325 MG PO TABS
ORAL_TABLET | ORAL | Status: AC
  Filled 2023-05-14: qty 1

## 2023-05-14 MED ORDER — DEXAMETHASONE SODIUM PHOSPHATE 10 MG/ML IJ SOLN
INTRAMUSCULAR | Status: DC | PRN
  Administered 2023-05-14: 5 mg via INTRAVENOUS

## 2023-05-14 MED ORDER — FAMOTIDINE 20 MG PO TABS
ORAL_TABLET | ORAL | Status: AC
  Filled 2023-05-14: qty 1

## 2023-05-14 MED ORDER — BUPIVACAINE LIPOSOME 1.3 % IJ SUSP: 20.0000 mL | Freq: Once | INTRAMUSCULAR | Status: DC

## 2023-05-14 MED ORDER — BUPIVACAINE LIPOSOME 1.3 % IJ SUSP
INTRAMUSCULAR | Status: DC | PRN
Start: 2023-05-14 — End: 2023-05-14
  Administered 2023-05-14: 20 mL

## 2023-05-14 MED ORDER — CHLORHEXIDINE GLUCONATE 0.12 % MT SOLN
15.0000 mL | Freq: Once | OROMUCOSAL | Status: AC
  Administered 2023-05-14: 15 mL via OROMUCOSAL

## 2023-05-14 MED ORDER — FAMOTIDINE 20 MG PO TABS
20.0000 mg | ORAL_TABLET | Freq: Once | ORAL | Status: AC
  Administered 2023-05-14: 20 mg via ORAL

## 2023-05-14 MED ORDER — CELECOXIB 200 MG PO CAPS
ORAL_CAPSULE | ORAL | Status: AC
  Filled 2023-05-14: qty 1

## 2023-05-14 MED ORDER — MIDAZOLAM HCL 2 MG/2ML IJ SOLN
INTRAMUSCULAR | Status: DC | PRN
  Administered 2023-05-14: 2 mg via INTRAVENOUS

## 2023-05-14 MED ORDER — GABAPENTIN 300 MG PO CAPS
ORAL_CAPSULE | ORAL | Status: AC
  Filled 2023-05-14: qty 1

## 2023-05-14 MED ORDER — CHLORHEXIDINE GLUCONATE 0.12 % MT SOLN
OROMUCOSAL | Status: AC
  Filled 2023-05-14: qty 15

## 2023-05-14 MED ORDER — CELECOXIB 200 MG PO CAPS
200.0000 mg | ORAL_CAPSULE | ORAL | Status: AC
  Administered 2023-05-14: 200 mg via ORAL

## 2023-05-14 MED ORDER — BUPIVACAINE LIPOSOME 1.3 % IJ SUSP
INTRAMUSCULAR | Status: AC
  Filled 2023-05-14: qty 20

## 2023-05-14 MED ORDER — LACTATED RINGERS IV SOLN: INTRAVENOUS | Status: DC

## 2023-05-14 MED ORDER — ONDANSETRON HCL 4 MG/2ML IJ SOLN
INTRAMUSCULAR | Status: AC
  Filled 2023-05-14: qty 2

## 2023-05-14 MED ORDER — LACTATED RINGERS IV SOLN: INTRAVENOUS | Status: DC | PRN

## 2023-05-14 MED ORDER — FENTANYL CITRATE (PF) 100 MCG/2ML IJ SOLN
25.0000 ug | INTRAMUSCULAR | Status: DC | PRN
  Administered 2023-05-14 (×2): 25 ug via INTRAVENOUS

## 2023-05-14 MED ORDER — EPHEDRINE 5 MG/ML INJ
INTRAVENOUS | Status: AC
  Filled 2023-05-14: qty 5

## 2023-05-14 MED ORDER — CEFAZOLIN SODIUM-DEXTROSE 2-4 GM/100ML-% IV SOLN
INTRAVENOUS | Status: AC
  Filled 2023-05-14: qty 100

## 2023-05-14 MED ORDER — LIDOCAINE HCL (CARDIAC) PF 100 MG/5ML IV SOSY
PREFILLED_SYRINGE | INTRAVENOUS | Status: DC | PRN
  Administered 2023-05-14: 60 mg via INTRAVENOUS

## 2023-05-14 MED ORDER — BUPIVACAINE-EPINEPHRINE (PF) 0.25% -1:200000 IJ SOLN
INTRAMUSCULAR | Status: DC | PRN
  Administered 2023-05-14: 30 mL

## 2023-05-14 MED ORDER — ACETAMINOPHEN 500 MG PO TABS
ORAL_TABLET | ORAL | Status: AC
  Filled 2023-05-14: qty 2

## 2023-05-14 SURGICAL SUPPLY — 41 items
ADH SKN CLS APL DERMABOND .7 (GAUZE/BANDAGES/DRESSINGS) ×1
APL PRP STRL LF DISP 70% ISPRP (MISCELLANEOUS) ×1
APPLIER CLIP 11 MED OPEN (CLIP)
APPLIER CLIP 13 LRG OPEN (CLIP)
APR CLP LRG 13 20 CLIP (CLIP)
APR CLP MED 11 20 MLT OPN (CLIP)
BLADE CLIPPER SURG (BLADE) ×2 IMPLANT
CHLORAPREP W/TINT 26 (MISCELLANEOUS) ×1 IMPLANT
CLIP APPLIE 11 MED OPEN (CLIP) ×1 IMPLANT
CLIP APPLIE 13 LRG OPEN (CLIP) ×1 IMPLANT
DERMABOND ADVANCED .7 DNX12 (GAUZE/BANDAGES/DRESSINGS) IMPLANT
DRAPE LAPAROTOMY 100X77 ABD (DRAPES) ×1 IMPLANT
DRSG TELFA 3X8 NADH STRL (GAUZE/BANDAGES/DRESSINGS) ×1 IMPLANT
ELECT CAUTERY BLADE 6.4 (BLADE) ×1 IMPLANT
ELECT REM PT RETURN 9FT ADLT (ELECTROSURGICAL) ×1
ELECTRODE REM PT RTRN 9FT ADLT (ELECTROSURGICAL) ×2 IMPLANT
GAUZE 4X4 16PLY ~~LOC~~+RFID DBL (SPONGE) ×1 IMPLANT
GAUZE SPONGE 4X4 12PLY STRL (GAUZE/BANDAGES/DRESSINGS) ×1 IMPLANT
GLOVE BIO SURGEON STRL SZ7 (GLOVE) ×1 IMPLANT
GOWN STRL REUS W/ TWL LRG LVL3 (GOWN DISPOSABLE) ×2 IMPLANT
GOWN STRL REUS W/TWL LRG LVL3 (GOWN DISPOSABLE) ×2
MANIFOLD NEPTUNE II (INSTRUMENTS) ×1 IMPLANT
MESH VENTRALEX ST 2.5 CRC MED (Mesh General) IMPLANT
NDL HYPO 22X1.5 SAFETY MO (MISCELLANEOUS) ×1 IMPLANT
NDL HYPO 25X1 1.5 SAFETY (NEEDLE) ×2 IMPLANT
NEEDLE HYPO 22X1.5 SAFETY MO (MISCELLANEOUS) ×1 IMPLANT
NEEDLE HYPO 25X1 1.5 SAFETY (NEEDLE) ×1 IMPLANT
PACK BASIN MINOR ARMC (MISCELLANEOUS) ×1 IMPLANT
SPONGE T-LAP 18X18 ~~LOC~~+RFID (SPONGE) ×1 IMPLANT
STAPLER SKIN PROX 35W (STAPLE) ×2 IMPLANT
SUT ETHIBOND 0 MO6 C/R (SUTURE) ×1 IMPLANT
SUT MNCRL 4-0 (SUTURE) ×1
SUT MNCRL 4-0 27XMFL (SUTURE) ×1
SUT VIC AB 2-0 SH 27 (SUTURE) ×2
SUT VIC AB 2-0 SH 27XBRD (SUTURE) ×4 IMPLANT
SUTURE MNCRL 4-0 27XMF (SUTURE) IMPLANT
SYR 20ML LL LF (SYRINGE) ×1 IMPLANT
TAPE MICROFOAM 4IN (TAPE) ×1 IMPLANT
TRAP FLUID SMOKE EVACUATOR (MISCELLANEOUS) ×1 IMPLANT
WATER STERILE IRR 1000ML POUR (IV SOLUTION) ×2 IMPLANT
WATER STERILE IRR 500ML POUR (IV SOLUTION) ×1 IMPLANT

## 2023-05-14 NOTE — Transfer of Care (Signed)
Immediate Anesthesia Transfer of Care Note  Patient: Edward Mckenzie  Procedure(s) Performed: HERNIA REPAIR VENTRAL ADULT, open  Patient Location: PACU  Anesthesia Type:General  Level of Consciousness: drowsy and patient cooperative  Airway & Oxygen Therapy: Patient Spontanous Breathing and Patient connected to face mask oxygen  Post-op Assessment: Report given to RN and Post -op Vital signs reviewed and stable  Post vital signs: Reviewed and stable  Last Vitals:  Vitals Value Taken Time  BP 113/71 05/14/23 1611  Temp 37.2 C 05/14/23 1611  Pulse 65 05/14/23 1612  Resp 14 05/14/23 1612  SpO2 100 % 05/14/23 1612  Vitals shown include unfiled device data.  Last Pain:  Vitals:   05/14/23 1214  TempSrc: Temporal  PainSc: 7          Complications: No notable events documented.

## 2023-05-14 NOTE — Discharge Instructions (Addendum)
AMBULATORY SURGERY  DISCHARGE INSTRUCTIONS   The drugs that you were given will stay in your system until tomorrow so for the next 24 hours you should not:  Drive an automobile Make any legal decisions Drink any alcoholic beverage   You may resume regular meals tomorrow.  Today it is better to start with liquids and gradually work up to solid foods.  You may eat anything you prefer, but it is better to start with liquids, then soup and crackers, and gradually work up to solid foods.   Please notify your doctor immediately if you have any unusual bleeding, trouble breathing, redness and pain at the surgery site, drainage, fever, or pain not relieved by medication.   Your post-operative visit with Dr.                                     is: Date:                        Time:    Please call to schedule your post-operative visit.  Additional Instructions: Open Hernia Repair, Adult, Care After After an open hernia repair, it's common to have: Pain in your belly. Slight bruising. A little swelling. A small amount of blood from the cut from surgery. Follow these instructions at home: Medicines Take your medicines only as told by your health care provider. If told, take steps to prevent trouble pooping. You may need to: Drink enough fluid to keep your pee (urine) pale yellow. Take medicines to help you poop. Eat foods high in fiber. These include beans, whole grains, and fresh fruits and vegetables. Ask your provider if you should avoid driving or using machines while you're taking your medicine. If you were given antibiotics, take them as told by your provider. Do not stop taking them even if you start to feel better. Incision care  Take care of your cut from surgery as told by your provider. Make sure you: Wash your hands with soap and water for at least 20 seconds before and after you change your bandage. If you can't use soap and water, use hand sanitizer. Change your  bandage. Leave stitches or skin glue in place for at least 2 weeks. Leave tape strips alone unless you're told to take them off. You may trim the edges of the tape strips if they curl up. Check the area around your cut every day for signs of infection. Check for: More redness, swelling, or pain. More fluid or blood. Warmth. Pus or a bad smell. Wear loose clothes while your cut heals. Activity Rest as told. You may have to avoid lifting. Ask how much weight you can safely lift. Do not play contact sports until your provider says it's safe. Return to normal activities when you're told. Ask what things are safe for you to do. General instructions If you were given a sedative during your surgery, do not drive or use machines until you're told it's safe. A sedative can make you sleepy. Do not take baths, swim, or use a hot tub until told. Ask if showers are okay. You may need to take sponge baths only. Hold a pillow over your belly when you cough or sneeze. This helps with pain. Do not smoke, vape, or use products with nicotine or tobacco in them. If you need help quitting, talk with your provider. Your provider may give you  more instructions. Make sure you know what you can and can't do. Contact a health care provider if: You have signs of infection. You have a fever or chills. You have blood in your poop. You haven't pooped in 2-3 days. Your pain doesn't get better with medicine. Get help right away if: You have chest pain. You're short of breath. You feel faint or light-headed. You have very bad pain. You start to vomit. You have pain, swelling, or redness in a leg. These symptoms may be an emergency. Call 911 right away. Do not wait to see if the symptoms will go away. Do not drive yourself to the hospital. This information is not intended to replace advice given to you by your health care provider. Make sure you discuss any questions you have with your health care  provider. Document Revised: 10/21/2022 Document Reviewed: 10/21/2022 Elsevier Patient Education  2024 [employer].

## 2023-05-14 NOTE — Anesthesia Procedure Notes (Signed)
Procedure Name: LMA Insertion Date/Time: 05/14/2023 3:08 PM  Performed by: Elmarie Mainland, CRNAPre-anesthesia Checklist: Patient identified, Emergency Drugs available, Suction available and Patient being monitored Patient Re-evaluated:Patient Re-evaluated prior to induction Oxygen Delivery Method: Circle system utilized Preoxygenation: Pre-oxygenation with 100% oxygen Induction Type: IV induction LMA: LMA inserted LMA Size: 5.0 Placement Confirmation: positive ETCO2 and breath sounds checked- equal and bilateral Tube secured with: Tape Dental Injury: Teeth and Oropharynx as per pre-operative assessment

## 2023-05-14 NOTE — Op Note (Signed)
Umbilical Hernia Repair with Mesh 6.4 cm ventralex ( BARD)  Pre-operative Diagnosis: umbilical hernia  Post-operative Diagnosis: same  Surgeon: Sterling Big, MD FACS  Anesthesia: Gen. with endotracheal tube   Findings: 3 cm incarcerated Umbilical Hernia  Estimated Blood Loss: 5cc                 Specimens: sac          Complications: none              Procedure Details  The patient was seen again in the Holding Room. The benefits, complications, treatment options, and expected outcomes were discussed with the patient. The risks of bleeding, infection, recurrence of symptoms, failure to resolve symptoms, bowel injury, mesh placement, mesh infection, any of which could require further surgery were reviewed with the patient. The likelihood of improving the patient's symptoms with return to their baseline status is good.  The patient and/or family concurred with the proposed plan, giving informed consent.  The patient was taken to Operating Room, identified and the procedure verified.  A Time Out was held and the above information confirmed.  Prior to the induction of general anesthesia, antibiotic prophylaxis was administered. VTE prophylaxis was in place. General endotracheal anesthesia was then administered and tolerated well. After the induction, the abdomen was prepped with Chloraprep and draped in the sterile fashion. The patient was positioned in the supine position.  Incision was created with a scalpel over the hernia defect. Electrocautery was used to dissect through subcutaneous tissue, the hernia sac was opened excised.  The hernia was measured. A BARD 6.4cm Ventralex  patch selected,  placed in an underlay fashion and secured to the fascia in four corners via trans fascial sutures.  I closed the hernia defect with interrupted 0 Ethibond sutures.   Incision was closed in a 2 layer fashion with 3-0 Vicryl and 4-0 Monocryl. Dermabond was used to coat the skin. Liposomal Marcaine   was used to inject around the incision in a full thickness fashion. Patient tolerated procedure well and there were no immediate complications. Needle and laparotomy counts were correct

## 2023-05-14 NOTE — Interval H&P Note (Signed)
History and Physical Interval Note:  05/14/2023 1:09 PM  Edward Mckenzie  has presented today for surgery, with the diagnosis of ventral henia 3 cm reducible.  The various methods of treatment have been discussed with the patient and family. After consideration of risks, benefits and other options for treatment, the patient has consented to  Procedure(s): HERNIA REPAIR VENTRAL ADULT, open (N/A) as a surgical intervention.  The patient's history has been reviewed, patient examined, no change in status, stable for surgery.  I have reviewed the patient's chart and labs.  Questions were answered to the patient's satisfaction.     Xzaria Teo F Jady Braggs

## 2023-05-14 NOTE — Anesthesia Preprocedure Evaluation (Signed)
Anesthesia Evaluation  Patient identified by MRN, date of birth, ID band Patient awake    Reviewed: Allergy & Precautions, H&P , NPO status , Patient's Chart, lab work & pertinent test results, reviewed documented beta blocker date and time   History of Anesthesia Complications Negative for: history of anesthetic complications  Airway Mallampati: III  TM Distance: >3 FB Neck ROM: full    Dental  (+) Dental Advidsory Given, Teeth Intact   Pulmonary neg shortness of breath, neg COPD, neg recent URI, Current Smoker and Patient abstained from smoking.   Pulmonary exam normal breath sounds clear to auscultation       Cardiovascular Exercise Tolerance: Good hypertension, (-) angina (-) Past MI Normal cardiovascular exam(-) dysrhythmias (-) Valvular Problems/Murmurs Rhythm:regular Rate:Normal     Neuro/Psych negative neurological ROS  negative psych ROS   GI/Hepatic negative GI ROS, Neg liver ROS,,,  Endo/Other  negative endocrine ROS    Renal/GU negative Renal ROS  negative genitourinary   Musculoskeletal   Abdominal   Peds  Hematology negative hematology ROS (+)   Anesthesia Other Findings Past Medical History: No date: Carpal tunnel syndrome of right wrist No date: Hypertension No date: Jones fracture     Comment:  left foot No date: Wears contact lenses   Reproductive/Obstetrics negative OB ROS                             Anesthesia Physical Anesthesia Plan  ASA: 2  Anesthesia Plan: General   Post-op Pain Management:    Induction: Intravenous  PONV Risk Score and Plan: 1 and Ondansetron, Dexamethasone, Midazolam and Treatment may vary due to age or medical condition  Airway Management Planned: Oral ETT  Additional Equipment:   Intra-op Plan:   Post-operative Plan: Extubation in OR  Informed Consent: I have reviewed the patients History and Physical, chart, labs and  discussed the procedure including the risks, benefits and alternatives for the proposed anesthesia with the patient or authorized representative who has indicated his/her understanding and acceptance.     Dental Advisory Given  Plan Discussed with: Anesthesiologist, CRNA and Surgeon  Anesthesia Plan Comments:         Anesthesia Quick Evaluation

## 2023-05-14 NOTE — Progress Notes (Signed)
   05/14/23 1500  Spiritual Encounters  Type of Visit Initial  Care provided to: Patient  Referral source Chaplain assessment  Reason for visit Routine spiritual support  OnCall Visit No   Chaplain visited with patient and provided compassionate care prior to procedure. Chaplain spiritual support services remain available as the need arises.

## 2023-05-15 ENCOUNTER — Other Ambulatory Visit: Payer: Self-pay | Admitting: Family Medicine

## 2023-05-15 ENCOUNTER — Encounter: Payer: Self-pay | Admitting: Surgery

## 2023-05-15 DIAGNOSIS — Z1211 Encounter for screening for malignant neoplasm of colon: Secondary | ICD-10-CM

## 2023-05-15 DIAGNOSIS — Z1212 Encounter for screening for malignant neoplasm of rectum: Secondary | ICD-10-CM

## 2023-05-18 LAB — SURGICAL PATHOLOGY

## 2023-05-23 NOTE — Anesthesia Postprocedure Evaluation (Signed)
Anesthesia Post Note  Patient: Edward Mckenzie  Procedure(s) Performed: HERNIA REPAIR VENTRAL ADULT, open  Patient location during evaluation: PACU Anesthesia Type: General Level of consciousness: awake and alert Pain management: pain level controlled Vital Signs Assessment: post-procedure vital signs reviewed and stable Respiratory status: spontaneous breathing, nonlabored ventilation, respiratory function stable and patient connected to nasal cannula oxygen Cardiovascular status: blood pressure returned to baseline and stable Postop Assessment: no apparent nausea or vomiting Anesthetic complications: no   No notable events documented.   Last Vitals:  Vitals:   05/14/23 1651 05/14/23 1704  BP:  135/78  Pulse: (!) 56 60  Resp: 17 16  Temp: 36.9 C 36.7 C  SpO2: 96% 96%    Last Pain:  Vitals:   05/14/23 1704  TempSrc: Temporal  PainSc: 3                  Lenard Simmer

## 2023-05-27 ENCOUNTER — Ambulatory Visit: Payer: Managed Care, Other (non HMO) | Admitting: Physician Assistant

## 2023-05-27 ENCOUNTER — Encounter: Payer: Self-pay | Admitting: Physician Assistant

## 2023-05-27 VITALS — BP 155/92 | HR 102 | Temp 99.1°F | Ht 69.0 in | Wt 161.4 lb

## 2023-05-27 DIAGNOSIS — K429 Umbilical hernia without obstruction or gangrene: Secondary | ICD-10-CM

## 2023-05-27 DIAGNOSIS — Z09 Encounter for follow-up examination after completed treatment for conditions other than malignant neoplasm: Secondary | ICD-10-CM

## 2023-05-27 DIAGNOSIS — K439 Ventral hernia without obstruction or gangrene: Secondary | ICD-10-CM

## 2023-05-27 NOTE — Progress Notes (Unsigned)
[facility] POST-OP OFFICE VISIT  05/27/2023  HPI: OTHER ATIENZA is a 52 y.o. male 13 days s/p umbilical hernia repair with Dr Everlene Farrier   He is doing well Umbilical soreness; worse with moving but this is improving daily Only needed pain medications until day 3 No fever, chills, nausea, emesis, bowel changes Incision is healing well Ambulating well No other complaints  Vital signs: BP (!) 155/92 (BP Location: Left Arm, Patient Position: Sitting, Cuff Size: Small)   Pulse (!) 102   Temp 99.1 F (37.3 C) (Oral)   Ht 5\' 9"  (1.753 m)   Wt 161 lb 6.4 oz (73.2 kg)   SpO2 98%   BMI 23.83 kg/m    Physical Exam: Constitutional: Well appearing male, NAD Abdomen: Soft, umbilical soreness, non-distended, no rebound/guarding Skin: Umbilical incision is healing well, there is expected inflammation, no erythema or drainage   Assessment/Plan: This is a 52 y.o. male 13 days s/p umbilical hernia repair with Dr Everlene Farrier    - Pain control prn  - Reviewed wound care recommendation  - Reviewed lifting restrictions; 6 weeks total  - He can follow up on as needed basis; He understands to call with questions/concerns  Face-to-face time spent with the patient and care providers was 20 minutes, with more than 50% of the time spent counseling, educating, and coordinating care of the patient.     -- Lynden Oxford, PA-C [facility] 05/27/2023, 3:41 PM M-F: 7am - 4pm

## 2023-05-27 NOTE — Patient Instructions (Signed)

## 2023-06-04 ENCOUNTER — Encounter: Payer: Self-pay | Admitting: Family Medicine

## 2023-06-04 ENCOUNTER — Ambulatory Visit: Payer: Managed Care, Other (non HMO) | Admitting: Family Medicine

## 2023-06-04 VITALS — BP 116/72 | HR 88 | Ht 69.0 in | Wt 159.6 lb

## 2023-06-04 DIAGNOSIS — R198 Other specified symptoms and signs involving the digestive system and abdomen: Secondary | ICD-10-CM | POA: Diagnosis not present

## 2023-06-04 DIAGNOSIS — M5412 Radiculopathy, cervical region: Secondary | ICD-10-CM | POA: Insufficient documentation

## 2023-06-04 MED ORDER — MUPIROCIN 2 % EX OINT: 1.0000 | TOPICAL_OINTMENT | Freq: Two times a day (BID) | CUTANEOUS | 0 refills | Status: DC

## 2023-06-04 NOTE — Assessment & Plan Note (Signed)
Has not heard from PM&[facility] yet- will reach out to them. To start PT on Monday. Has tried gabapentin in the past without benefit. Call with any concerns. Continue to monitor.

## 2023-06-04 NOTE — Progress Notes (Signed)
BP 116/72   Pulse 88   Ht 5\' 9"  (1.753 m)   Wt 159 lb 9.6 oz (72.4 kg)   SpO2 99%   BMI 23.57 kg/m    Subjective:    Patient ID: Mckenzie Mckenzie, male    DOB: [birth year], 52 y.o.   MRN: [id number]  HPI: Mckenzie Mckenzie is a 52 y.o. male  Chief Complaint  Patient presents with   Numbness    Patient says the issue is about the same, as he has not been able to start Physical Therapy and will not be able to start until this upcoming Monday. Patient says when they offered him an sooner appointment. It interfered with his work schedule.    NUMBNESS Duration: 3+ months (possibly years) Onset: gradual Location: whole R arm, mainly in his R hand  Bilateral: no Symmetric: no Decreased sensation: yes  Weakness: yes Pain: no Quality:  numb and tinging Severity: severe  Frequency: constant Trauma: no Recent illness: no Diabetes: no Thyroid disease: no  HIV: no  Alcoholism: no  Spinal cord injury: no Status: uncontrolled  Relevant past medical, surgical, family and social history reviewed and updated as indicated. Interim medical history since our last visit reviewed. Allergies and medications reviewed and updated.  Review of Systems  Constitutional: Negative.   Respiratory: Negative.    Cardiovascular: Negative.   Gastrointestinal: Negative.   Musculoskeletal: Negative.   Neurological:  Positive for weakness and numbness. Negative for dizziness, tremors, seizures, syncope, facial asymmetry, speech difficulty, light-headedness and headaches.  Psychiatric/Behavioral: Negative.      Per HPI unless specifically indicated above     Objective:    BP 116/72   Pulse 88   Ht 5\' 9"  (1.753 m)   Wt 159 lb 9.6 oz (72.4 kg)   SpO2 99%   BMI 23.57 kg/m   Wt Readings from Last 3 Encounters:  06/04/23 159 lb 9.6 oz (72.4 kg)  05/27/23 161 lb 6.4 oz (73.2 kg)  05/14/23 167 lb 15.9 oz (76.2 kg)    Physical Exam Vitals and nursing note reviewed.   Constitutional:      General: He is not in acute distress.    Appearance: Normal appearance. He is normal weight. He is not ill-appearing, toxic-appearing or diaphoretic.  HENT:     Head: Normocephalic and atraumatic.     Right Ear: External ear normal.     Left Ear: External ear normal.     Nose: Nose normal.     Mouth/Throat:     Mouth: Mucous membranes are moist.     Pharynx: Oropharynx is clear.  Eyes:     General: No scleral icterus.       Right eye: No discharge.        Left eye: No discharge.     Extraocular Movements: Extraocular movements intact.     Conjunctiva/sclera: Conjunctivae normal.     Pupils: Pupils are equal, round, and reactive to light.  Cardiovascular:     Rate and Rhythm: Normal rate and regular rhythm.     Pulses: Normal pulses.     Heart sounds: Normal heart sounds. No murmur heard.    No friction rub. No gallop.  Pulmonary:     Effort: Pulmonary effort is normal. No respiratory distress.     Breath sounds: Normal breath sounds. No stridor. No wheezing, rhonchi or rales.  Chest:     Chest wall: No tenderness.  Abdominal:     Comments: Drainage from his belly button  Musculoskeletal:        General: Normal range of motion.     Cervical back: Normal range of motion and neck supple.  Skin:    General: Skin is warm and dry.     Capillary Refill: Capillary refill takes less than 2 seconds.     Coloration: Skin is not jaundiced or pale.     Findings: No bruising, erythema, lesion or rash.  Neurological:     General: No focal deficit present.     Mental Status: He is alert and oriented to person, place, and time. Mental status is at baseline.  Psychiatric:        Mood and Affect: Mood normal.        Behavior: Behavior normal.        Thought Content: Thought content normal.        Judgment: Judgment normal.     Results for orders placed or performed during the hospital encounter of 05/14/23  Surgical pathology  Result Value Ref Range   SURGICAL  PATHOLOGY      SURGICAL PATHOLOGY [facility] [address], [address] [place], [place] [address] Telephone [phone] or [phone] Fax [phone]  REPORT OF SURGICAL PATHOLOGY   Accession #: [id number] Patient Name: Mckenzie Mckenzie Visit # : [id number]  MRN: [id number] Physician: Sterling Big DOB/Age 17-Nov-1970 (Age: 88) Gender: M Collected Date: 05/14/2023 Received Date: 05/15/2023  FINAL DIAGNOSIS       1. Hernia sac,  :       - MESOTHELIAL-LINED FIBROADIPOSE TISSUE WITH REACTIVE CHANGES CONSISTENT WITH      HERNIA SAC.      - NEGATIVE FOR MALIGNANCY.       DATE SIGNED OUT: 05/18/2023 ELECTRONIC SIGNATURE : Oneita Kras Md, Delice Bison , Pathologist, Electronic Signature  MICROSCOPIC DESCRIPTION  CASE COMMENTS STAINS USED IN DIAGNOSIS: H&E    CLINICAL HISTORY  SPECIMEN(S) OBTAINED 1. Hernia sac,  SPECIMEN COMMENTS: SPECIMEN CLINICAL INFORMATION: 1. Ventral hernia 3cm reducible    Gross Description 1. Received in formalin, la beled "hernia sac", is a 2.5 x 2.5 x 1.2 cm fragments of slightly firm fibroadipose. The external surface is slightly ragged. Sectioning reveals a smooth, tan-gray fibrous lining and soft, lobulated adipose. No masses or lesions are grossly identified. Representative sections are submitted in block 1A.      SMB      05/15/2023        Report signed out from the following location(s) [place] [address], [place] [address] CLIA #: [id number]  [facility] [address] [place], [place] [address] CLIA #: [id number]       Assessment & Plan:   Problem List Items Addressed This Visit       Nervous and Auditory   Cervical radiculopathy - Primary    Has not heard from [facility] yet- will reach out to them. To start PT on Monday. Has tried gabapentin in the past without benefit. Call with any concerns. Continue to monitor.       Other Visit Diagnoses      Umbilical discharge       Will treat with bactroban. Call with any concerns. Continue to follow with general surgery.        Follow up plan: Return if symptoms worsen or fail to improve.

## 2023-08-17 ENCOUNTER — Telehealth: Payer: Managed Care, Other (non HMO) | Admitting: Physician Assistant

## 2023-08-17 ENCOUNTER — Ambulatory Visit: Payer: Self-pay | Admitting: *Deleted

## 2023-08-17 DIAGNOSIS — J069 Acute upper respiratory infection, unspecified: Secondary | ICD-10-CM | POA: Diagnosis not present

## 2023-08-17 DIAGNOSIS — B9689 Other specified bacterial agents as the cause of diseases classified elsewhere: Secondary | ICD-10-CM

## 2023-08-17 MED ORDER — BENZONATATE 100 MG PO CAPS
100.0000 mg | ORAL_CAPSULE | Freq: Three times a day (TID) | ORAL | 0 refills | Status: DC | PRN
Start: 2023-08-17 — End: 2023-11-19

## 2023-08-17 MED ORDER — PROMETHAZINE-DM 6.25-15 MG/5ML PO SYRP
5.0000 mL | ORAL_SOLUTION | Freq: Four times a day (QID) | ORAL | 0 refills | Status: DC | PRN
Start: 2023-08-17 — End: 2023-11-19

## 2023-08-17 MED ORDER — PREDNISONE 20 MG PO TABS: 40.0000 mg | ORAL_TABLET | Freq: Every day | ORAL | 0 refills | Status: DC

## 2023-08-17 MED ORDER — AMOXICILLIN-POT CLAVULANATE 875-125 MG PO TABS
1.0000 | ORAL_TABLET | Freq: Two times a day (BID) | ORAL | 0 refills | Status: DC
Start: 2023-08-17 — End: 2023-11-19

## 2023-08-17 NOTE — Telephone Encounter (Signed)
  Chief Complaint: Patient calling with second round on cough, congestion, nasal congestion, sore throat Symptoms: see above- patient reports he got better- but symptoms returned and getting worse Frequency: restarted Saturday Pertinent Negatives: Patient denies fever now, SOB Disposition: [] ED /[] Urgent Care (no appt availability in office) / [] Appointment(In office/virtual)/ [x]  [facility]/ [] Home Care/ [] Refused Recommended Disposition /[] [place] [facility]/ []  Follow-up with PCP Additional Notes: No open appointment in office or with float- UC/VV appointment scheduled.

## 2023-08-17 NOTE — Progress Notes (Signed)
 Virtual Visit Consent   [name], you are scheduled for a virtual visit with a [facility] provider today. Just as with appointments in the office, your consent must be obtained to participate. Your consent will be active for this visit and any virtual visit you may have with one of our providers in the next 365 days. If you have a MyChart account, a copy of this consent can be sent to you electronically.  As this is a virtual visit, video technology does not allow for your provider to perform a traditional examination. This may limit your provider's ability to fully assess your condition. If your provider identifies any concerns that need to be evaluated in person or the need to arrange testing (such as labs, EKG, etc.), we will make arrangements to do so. Although advances in technology are sophisticated, we cannot ensure that it will always work on either your end or our end. If the connection with a video visit is poor, the visit may have to be switched to a telephone visit. With either a video or telephone visit, we are not always able to ensure that we have a secure connection.  By engaging in this virtual visit, you consent to the provision of healthcare and authorize for your insurance to be billed (if applicable) for the services provided during this visit. Depending on your insurance coverage, you may receive a charge related to this service.  I need to obtain your verbal consent now. Are you willing to proceed with your visit today? Edward Mckenzie has provided verbal consent on 08/17/2023 for a virtual visit (video or telephone). [name], PA-C  Date: 08/17/2023 11:25 AM  Virtual Visit via Video Note   I, [name], connected with  Edward Mckenzie  ([id number], Jun 29, 1971) on 08/17/23 at 11:30 AM EST by a video-enabled telemedicine application and verified that I am speaking with the correct person using two identifiers.  Location: Patient:  Virtual Visit Location Patient: Home Provider: Virtual Visit Location Provider: Home Office   I discussed the limitations of evaluation and management by telemedicine and the availability of in person appointments. The patient expressed understanding and agreed to proceed.    History of Present Illness: Edward Mckenzie is a 53 y.o. who identifies as a male who was assigned male at birth, and is being seen today for URI symptoms.  HPI: URI  This is a new problem. The current episode started 1 to 4 weeks ago (Had similar symptoms a month ago and treated conservatively with OTC medications, symptoms recurred on Saturday). The problem has been gradually worsening. Maximum temperature: slight reported \ Associated symptoms include congestion, coughing, ear pain, headaches, a plugged ear sensation, rhinorrhea, sinus pain, a sore throat and wheezing (mild). Pertinent negatives include no chest pain, diarrhea, nausea or vomiting. Associated symptoms comments: Chills, eyes watering, decreased appetite. He has tried acetaminophen  (dayquil, benadryl) for the symptoms. The treatment provided no relief.     Problems:  Patient Active Problem List   Diagnosis Date Noted   Cervical radiculopathy 06/04/2023   ED (erectile dysfunction) 04/22/2023   Non-recurrent unilateral inguinal hernia without obstruction or gangrene 12/19/2019   Rosacea 05/21/2017   HTN (hypertension) 03/20/2016   Insomnia 01/24/2016   Allergic rhinitis 04/02/2015    Allergies: No Known Allergies Medications:  Current Outpatient Medications:    amoxicillin -clavulanate (AUGMENTIN ) 875-125 MG tablet, Take 1 tablet by mouth 2 (two) times daily., Disp: 20 tablet, Rfl: 0   benzonatate  (TESSALON )  100 MG capsule, Take 1-2 capsules (100-200 mg total) by mouth 3 (three) times daily as needed., Disp: 30 capsule, Rfl: 0   predniSONE  (DELTASONE ) 20 MG tablet, Take 2 tablets (40 mg total) by mouth daily with breakfast., Disp: 10 tablet, Rfl:  0   promethazine -dextromethorphan (PROMETHAZINE -DM) 6.25-15 MG/5ML syrup, Take 5 mLs by mouth 4 (four) times daily as needed., Disp: 118 mL, Rfl: 0   Ascorbic Acid (VITAMIN C) 100 MG tablet, Take 100 mg by mouth daily., Disp: , Rfl:    calcium carbonate (TUMS - DOSED IN MG ELEMENTAL CALCIUM) 500 MG chewable tablet, Chew 1 tablet by mouth as needed for indigestion or heartburn., Disp: , Rfl:    Dihydroxyaluminum Sod Carb (ROLAIDS PO), Take 1 tablet by mouth as needed., Disp: , Rfl:    fexofenadine  (ALLEGRA  ALLERGY) 180 MG tablet, Take 1 tablet (180 mg total) by mouth daily. (Patient taking differently: Take 180 mg by mouth at bedtime.), Disp: 100 tablet, Rfl: 1   lisinopril  (ZESTRIL ) 10 MG tablet, Take 1 tablet (10 mg total) by mouth daily. (Patient taking differently: Take 10 mg by mouth every morning.), Disp: 90 tablet, Rfl: 1   metroNIDAZOLE  (METROGEL ) 1 % gel, Apply topically daily. (Patient taking differently: Apply 1 Application topically as needed.), Disp: 60 g, Rfl: 12   montelukast  (SINGULAIR ) 10 MG tablet, TAKE 1 TABLET BY MOUTH EVERYDAY AT BEDTIME (Patient taking differently: Take 10 mg by mouth every morning. TAKE 1 TABLET BY MOUTH EVERYDAY AT BEDTIME), Disp: 90 tablet, Rfl: 1   mupirocin  ointment (BACTROBAN ) 2 %, Apply 1 Application topically 2 (two) times daily., Disp: 22 g, Rfl: 0   naproxen  (NAPROSYN ) 500 MG tablet, Take 1 tablet (500 mg total) by mouth 2 (two) times daily with a meal., Disp: 180 tablet, Rfl: 1   tadalafil  (CIALIS ) 20 MG tablet, Take 0.5-1 tablets (10-20 mg total) by mouth every other day as needed for erectile dysfunction., Disp: 10 tablet, Rfl: 11   traZODone  (DESYREL ) 100 MG tablet, Take 1 tablet (100 mg total) by mouth at bedtime as needed. for sleep (Patient taking differently: Take 100 mg by mouth at bedtime. for sleep), Disp: 90 tablet, Rfl: 1   triamcinolone  ointment (KENALOG ) 0.5 %, Apply 1 application topically 2 (two) times daily. (Patient taking differently:  Apply 1 application  topically as needed.), Disp: 30 g, Rfl: 3  Observations/Objective: Patient is well-developed, well-nourished in no acute distress.  Resting comfortably at home.  Head is normocephalic, atraumatic.  No labored breathing.  Speech is clear and coherent with logical content.  Patient is alert and oriented at baseline.    Assessment and Plan: 1. Bacterial upper respiratory infection (Primary) - amoxicillin -clavulanate (AUGMENTIN ) 875-125 MG tablet; Take 1 tablet by mouth 2 (two) times daily.  Dispense: 20 tablet; Refill: 0 - promethazine -dextromethorphan (PROMETHAZINE -DM) 6.25-15 MG/5ML syrup; Take 5 mLs by mouth 4 (four) times daily as needed.  Dispense: 118 mL; Refill: 0 - benzonatate  (TESSALON ) 100 MG capsule; Take 1-2 capsules (100-200 mg total) by mouth 3 (three) times daily as needed.  Dispense: 30 capsule; Refill: 0 - predniSONE  (DELTASONE ) 20 MG tablet; Take 2 tablets (40 mg total) by mouth daily with breakfast.  Dispense: 10 tablet; Refill: 0  - Worsening over a week despite OTC medications - Will treat with Augmentin  - Add Promethazine  DM and Tessalon  perles for cough - Prednisone  for inflammation and chest tightness - Can continue Mucinex  - Push fluids.  - Rest.  - Steam and humidifier can help -  Seek in person evaluation if worsening or symptoms fail to improve    Follow Up Instructions: I discussed the assessment and treatment plan with the patient. The patient was provided an opportunity to ask questions and all were answered. The patient agreed with the plan and demonstrated an understanding of the instructions.  A copy of instructions were sent to the patient via MyChart unless otherwise noted below.    The patient was advised to call back or seek an in-person evaluation if the symptoms worsen or if the condition fails to improve as anticipated.    [name], PA-C

## 2023-08-17 NOTE — Telephone Encounter (Signed)
 Reason for Disposition  Earache  Answer Assessment - Initial Assessment Questions 1. ONSET: When did the cough begin?      Second time- Saturday 2. SEVERITY: How bad is the cough today?      Coughing sputum 3. SPUTUM: Describe the color of your sputum (none, dry cough; clear, white, yellow, green)     Little green - not consistent  4. HEMOPTYSIS: Are you coughing up any blood? If so ask: How much? (flecks, streaks, tablespoons, etc.)     no 5. DIFFICULTY BREATHING: Are you having difficulty breathing? If Yes, ask: How bad is it? (e.g., mild, moderate, severe)    - MILD: No SOB at rest, mild SOB with walking, speaks normally in sentences, can lie down, no retractions, pulse < 100.    - MODERATE: SOB at rest, SOB with minimal exertion and prefers to sit, cannot lie down flat, speaks in phrases, mild retractions, audible wheezing, pulse 100-120.    - SEVERE: Very SOB at rest, speaks in single words, struggling to breathe, sitting hunched forward, retractions, pulse > 120      no 6. FEVER: Do you have a fever? If Yes, ask: What is your temperature, how was it measured, and when did it start?     Slight- low grade- went away   10. OTHER SYMPTOMS: Do you have any other symptoms? (e.g., runny nose, wheezing, chest pain)       Sore throat, nasal congestion, ear pressure,  Protocols used: Cough - Acute Productive-A-AH

## 2023-08-17 NOTE — Patient Instructions (Signed)
 Edward Mckenzie, thank you for joining [name], PA-C for today's virtual visit.  While this provider is not your primary care provider (PCP), if your PCP is located in our provider database this encounter information will be shared with them immediately following your visit.   A [facility] MyChart account gives you access to today's visit and all your visits, tests, and labs performed at [facility]  click here if you don't have a [facility] MyChart account or go to [email or web address]  Consent: (Patient) Edward Mckenzie provided verbal consent for this virtual visit at the beginning of the encounter.  Current Medications:  Current Outpatient Medications:    amoxicillin -clavulanate (AUGMENTIN ) 875-125 MG tablet, Take 1 tablet by mouth 2 (two) times daily., Disp: 20 tablet, Rfl: 0   benzonatate  (TESSALON ) 100 MG capsule, Take 1-2 capsules (100-200 mg total) by mouth 3 (three) times daily as needed., Disp: 30 capsule, Rfl: 0   predniSONE  (DELTASONE ) 20 MG tablet, Take 2 tablets (40 mg total) by mouth daily with breakfast., Disp: 10 tablet, Rfl: 0   promethazine -dextromethorphan (PROMETHAZINE -DM) 6.25-15 MG/5ML syrup, Take 5 mLs by mouth 4 (four) times daily as needed., Disp: 118 mL, Rfl: 0   Ascorbic Acid (VITAMIN C) 100 MG tablet, Take 100 mg by mouth daily., Disp: , Rfl:    calcium carbonate (TUMS - DOSED IN MG ELEMENTAL CALCIUM) 500 MG chewable tablet, Chew 1 tablet by mouth as needed for indigestion or heartburn., Disp: , Rfl:    Dihydroxyaluminum Sod Carb (ROLAIDS PO), Take 1 tablet by mouth as needed., Disp: , Rfl:    fexofenadine  (ALLEGRA  ALLERGY) 180 MG tablet, Take 1 tablet (180 mg total) by mouth daily. (Patient taking differently: Take 180 mg by mouth at bedtime.), Disp: 100 tablet, Rfl: 1   lisinopril  (ZESTRIL ) 10 MG tablet, Take 1 tablet (10 mg total) by mouth daily. (Patient taking differently: Take 10 mg by mouth every morning.), Disp:  90 tablet, Rfl: 1   metroNIDAZOLE  (METROGEL ) 1 % gel, Apply topically daily. (Patient taking differently: Apply 1 Application topically as needed.), Disp: 60 g, Rfl: 12   montelukast  (SINGULAIR ) 10 MG tablet, TAKE 1 TABLET BY MOUTH EVERYDAY AT BEDTIME (Patient taking differently: Take 10 mg by mouth every morning. TAKE 1 TABLET BY MOUTH EVERYDAY AT BEDTIME), Disp: 90 tablet, Rfl: 1   mupirocin  ointment (BACTROBAN ) 2 %, Apply 1 Application topically 2 (two) times daily., Disp: 22 g, Rfl: 0   naproxen  (NAPROSYN ) 500 MG tablet, Take 1 tablet (500 mg total) by mouth 2 (two) times daily with a meal., Disp: 180 tablet, Rfl: 1   tadalafil  (CIALIS ) 20 MG tablet, Take 0.5-1 tablets (10-20 mg total) by mouth every other day as needed for erectile dysfunction., Disp: 10 tablet, Rfl: 11   traZODone  (DESYREL ) 100 MG tablet, Take 1 tablet (100 mg total) by mouth at bedtime as needed. for sleep (Patient taking differently: Take 100 mg by mouth at bedtime. for sleep), Disp: 90 tablet, Rfl: 1   triamcinolone  ointment (KENALOG ) 0.5 %, Apply 1 application topically 2 (two) times daily. (Patient taking differently: Apply 1 application  topically as needed.), Disp: 30 g, Rfl: 3   Medications ordered in this encounter:  Meds ordered this encounter  Medications   amoxicillin -clavulanate (AUGMENTIN ) 875-125 MG tablet    Sig: Take 1 tablet by mouth 2 (two) times daily.    Dispense:  20 tablet    Refill:  0    Supervising Provider:   BLAISE ALEENE KIDD [  [id number]]   promethazine -dextromethorphan (PROMETHAZINE -DM) 6.25-15 MG/5ML syrup    Sig: Take 5 mLs by mouth 4 (four) times daily as needed.    Dispense:  118 mL    Refill:  0    Supervising Provider:   BLAISE ALEENE KIDD [id number]   benzonatate  (TESSALON ) 100 MG capsule    Sig: Take 1-2 capsules (100-200 mg total) by mouth 3 (three) times daily as needed.    Dispense:  30 capsule    Refill:  0    Supervising Provider:   LAMPTEY, PHILIP O [[id number]]   predniSONE   (DELTASONE ) 20 MG tablet    Sig: Take 2 tablets (40 mg total) by mouth daily with breakfast.    Dispense:  10 tablet    Refill:  0    Supervising Provider:   BLAISE ALEENE KIDD [[id number]]     *If you need refills on other medications prior to your next appointment, please contact your pharmacy*  Follow-Up: Call back or seek an in-person evaluation if the symptoms worsen or if the condition fails to improve as anticipated.  [facility] [phone]  Other Instructions Upper Respiratory Infection, Adult An upper respiratory infection (URI) is a common viral infection of the nose, throat, and upper air passages that lead to the lungs. The most common type of URI is the common cold. URIs usually get better on their own, without medical treatment. What are the causes? A URI is caused by a virus. You may catch a virus by: Breathing in droplets from an infected person's cough or sneeze. Touching something that has been exposed to the virus (is contaminated) and then touching your mouth, nose, or eyes. What increases the risk? You are more likely to get a URI if: You are very young or very old. You have close contact with others, such as at work, school, or a health care facility. You smoke. You have long-term (chronic) heart or lung disease. You have a weakened disease-fighting system (immune system). You have nasal allergies or asthma. You are experiencing a lot of stress. You have poor nutrition. What are the signs or symptoms? A URI usually involves some of the following symptoms: Runny or stuffy (congested) nose. Cough. Sneezing. Sore throat. Headache. Fatigue. Fever. Loss of appetite. Pain in your forehead, behind your eyes, and over your cheekbones (sinus pain). Muscle aches. Redness or irritation of the eyes. Pressure in the ears or face. How is this diagnosed? This condition may be diagnosed based on your medical history and symptoms, and a physical exam.  Your health care provider may use a swab to take a mucus sample from your nose (nasal swab). This sample can be tested to determine what virus is causing the illness. How is this treated? URIs usually get better on their own within 7-10 days. Medicines cannot cure URIs, but your health care provider may recommend certain medicines to help relieve symptoms, such as: Over-the-counter cold medicines. Cough suppressants. Coughing is a type of defense against infection that helps to clear the respiratory system, so take these medicines only as recommended by your health care provider. Fever-reducing medicines. Follow these instructions at home: Activity Rest as needed. If you have a fever, stay home from work or school until your fever is gone or until your health care provider says your URI cannot spread to other people (is no longer contagious). Your health care provider may have you wear a face mask to prevent your infection from spreading. Relieving symptoms Gargle with a  mixture of salt and water 3-4 times a day or as needed. To make salt water, completely dissolve -1 tsp (3-6 g) of salt in 1 cup (237 mL) of warm water. Use a cool-mist humidifier to add moisture to the air. This can help you breathe more easily. Eating and drinking  Drink enough fluid to keep your urine pale yellow. Eat soups and other clear broths. General instructions  Take over-the-counter and prescription medicines only as told by your health care provider. These include cold medicines, fever reducers, and cough suppressants. Do not use any products that contain nicotine or tobacco. These products include cigarettes, chewing tobacco, and vaping devices, such as e-cigarettes. If you need help quitting, ask your health care provider. Stay away from secondhand smoke. Stay up to date on all immunizations, including the yearly (annual) flu vaccine. Keep all follow-up visits. This is important. How to prevent the spread of  infection to others URIs can be contagious. To prevent the infection from spreading: Wash your hands with soap and water for at least 20 seconds. If soap and water are not available, use hand sanitizer. Avoid touching your mouth, face, eyes, or nose. Cough or sneeze into a tissue or your sleeve or elbow instead of into your hand or into the air.  Contact a health care provider if: You are getting worse instead of better. You have a fever or chills. Your mucus is brown or red. You have yellow or brown discharge coming from your nose. You have pain in your face, especially when you bend forward. You have swollen neck glands. You have pain while swallowing. You have white areas in the back of your throat. Get help right away if: You have shortness of breath that gets worse. You have severe or persistent: Headache. Ear pain. Sinus pain. Chest pain. You have chronic lung disease along with any of the following: Making high-pitched whistling sounds when you breathe, most often when you breathe out (wheezing). Prolonged cough (more than 14 days). Coughing up blood. A change in your usual mucus. You have a stiff neck. You have changes in your: Vision. Hearing. Thinking. Mood. These symptoms may be an emergency. Get help right away. Call 911. Do not wait to see if the symptoms will go away. Do not drive yourself to the hospital. Summary An upper respiratory infection (URI) is a common infection of the nose, throat, and upper air passages that lead to the lungs. A URI is caused by a virus. URIs usually get better on their own within 7-10 days. Medicines cannot cure URIs, but your health care provider may recommend certain medicines to help relieve symptoms. This information is not intended to replace advice given to you by your health care provider. Make sure you discuss any questions you have with your health care provider. Document Revised: 02/27/2021 Document Reviewed:  02/27/2021 Elsevier Patient Education  2024 [employer]    If you have been instructed to have an in-person evaluation today at a local Urgent Care facility, please use the link below. It will take you to a list of all of our available [facility], including address, phone number and hours of operation. Please do not delay care.  [facility]  If you or a family member do not have a primary care provider, use the link below to schedule a visit and establish care. When you choose a [facility] primary care physician or advanced practice provider, you gain a long-term partner in health.  Find a Primary Care Provider  Learn more about [facility]'s in-office and virtual care options: [facility] - Get Care Now

## 2023-08-24 ENCOUNTER — Telehealth: Payer: Self-pay | Admitting: Family Medicine

## 2023-08-24 ENCOUNTER — Encounter: Payer: Self-pay | Admitting: Family Medicine

## 2023-08-24 NOTE — Telephone Encounter (Signed)
 Patient called back and stated that he needs the note today and asked if it could be sent to his MyChart.

## 2023-08-24 NOTE — Telephone Encounter (Signed)
 Note on his chart

## 2023-08-24 NOTE — Telephone Encounter (Signed)
 Called patient. He stated he did receive note through his mychart.

## 2023-08-24 NOTE — Telephone Encounter (Signed)
 Copied from CRM [id number]. Topic: General - Inquiry >> Aug 24, 2023  8:25 AM Myrick T wrote: Reason for CRM: patient called stated he was out of work all week, had telehealth appt with Delon Dickinson PA on Friday but forgot to get a work note. Please f/u with patient to let him know if he can get a note for work

## 2023-11-19 ENCOUNTER — Other Ambulatory Visit: Payer: Self-pay | Admitting: Family Medicine

## 2023-11-19 ENCOUNTER — Ambulatory Visit: Admitting: Family Medicine

## 2023-11-19 ENCOUNTER — Encounter: Payer: Self-pay | Admitting: Family Medicine

## 2023-11-19 VITALS — BP 121/71 | HR 72 | Ht 69.5 in | Wt 184.4 lb

## 2023-11-19 DIAGNOSIS — Z0000 Encounter for general adult medical examination without abnormal findings: Secondary | ICD-10-CM

## 2023-11-19 DIAGNOSIS — G4700 Insomnia, unspecified: Secondary | ICD-10-CM | POA: Diagnosis not present

## 2023-11-19 DIAGNOSIS — I10 Essential (primary) hypertension: Secondary | ICD-10-CM | POA: Diagnosis not present

## 2023-11-19 DIAGNOSIS — M5412 Radiculopathy, cervical region: Secondary | ICD-10-CM

## 2023-11-19 LAB — MICROALBUMIN, URINE WAIVED
Creatinine, Urine Waived: 10 mg/dL (ref 10–300)
Microalb, Ur Waived: 10 mg/L (ref 0–19)

## 2023-11-19 MED ORDER — NAPROXEN 500 MG PO TABS: 500.0000 mg | ORAL_TABLET | Freq: Two times a day (BID) | ORAL | 1 refills | Status: DC

## 2023-11-19 MED ORDER — FEXOFENADINE HCL 180 MG PO TABS: 180.0000 mg | ORAL_TABLET | Freq: Every day | ORAL | 1 refills | Status: DC

## 2023-11-19 MED ORDER — METRONIDAZOLE 1 % EX GEL: Freq: Every day | CUTANEOUS | 12 refills | Status: DC

## 2023-11-19 MED ORDER — LISINOPRIL 10 MG PO TABS: 10.0000 mg | ORAL_TABLET | Freq: Every day | ORAL | 1 refills | Status: DC

## 2023-11-19 MED ORDER — TRIAMCINOLONE ACETONIDE 0.5 % EX OINT: 1.0000 | TOPICAL_OINTMENT | Freq: Two times a day (BID) | CUTANEOUS | 3 refills | Status: DC

## 2023-11-19 MED ORDER — TRAZODONE HCL 100 MG PO TABS: 100.0000 mg | ORAL_TABLET | Freq: Every evening | ORAL | 1 refills | Status: DC | PRN

## 2023-11-19 MED ORDER — TADALAFIL 20 MG PO TABS: 10.0000 mg | ORAL_TABLET | ORAL | 11 refills | Status: AC | PRN

## 2023-11-19 MED ORDER — MONTELUKAST SODIUM 10 MG PO TABS: ORAL_TABLET | ORAL | 1 refills | Status: DC

## 2023-11-19 NOTE — Assessment & Plan Note (Signed)
 Under good control on current regimen. Continue current regimen. Continue to monitor. Call with any concerns. Refills given. Labs drawn today.

## 2023-11-19 NOTE — Assessment & Plan Note (Signed)
 No better. Moving to [place]. Will put in referral to PM[facility]R in [place]. Call with any concerns.

## 2023-11-19 NOTE — Assessment & Plan Note (Signed)
 Under good control on current regimen. Continue current regimen. Continue to monitor. Call with any concerns. Refills given.

## 2023-11-19 NOTE — Progress Notes (Signed)
 BP 121/71 (BP Location: Left Arm, Patient Position: Sitting, Cuff Size: Normal)   Pulse 72   Ht 5' 9.5" (1.765 m)   Wt 184 lb 6.4 oz (83.6 kg)   SpO2 98%   BMI 26.84 kg/m    Subjective:    Patient ID: Edward Mckenzie, male    DOB: Apr 22, 1971, 53 y.o.   MRN: [id number]  HPI: Edward Mckenzie is a 53 y.o. male presenting on 11/19/2023 for comprehensive medical examination. Current medical complaints include:  HYPERTENSION  Hypertension status: controlled  Satisfied with current treatment? yes Duration of hypertension: chronic BP monitoring frequency:  not checking BP medication side effects:  no Medication compliance: excellent compliance Previous BP meds:lisinopril Aspirin: no Recurrent headaches: no Visual changes: no Palpitations: no Dyspnea: no Chest pain: no Lower extremity edema: no Dizzy/lightheaded: no  INSOMNIA Duration: chronic Satisfied with sleep quality: yes Difficulty falling asleep: no Difficulty staying asleep: no Waking a few hours after sleep onset: no Early morning awakenings: no Daytime hypersomnolence: no Wakes feeling refreshed: yes Good sleep hygiene: yes Apnea: no Snoring: no Depressed/anxious mood: no Recent stress: yes Restless legs/nocturnal leg cramps: no Chronic pain/arthritis: no History of sleep study: no Treatments attempted:  trazadone, melatonin, uinsom, and benadryl   Did 2 PT visits and didn't feel any better so stopped going. His neck is still not any better. Has continued with numbness and tingling and weakness.   Interim Problems from his last visit: no  Depression Screen done today and results listed below:     11/19/2023   11:03 AM 06/04/2023    8:36 AM 04/22/2023    8:10 AM 10/16/2022    8:04 AM 01/28/2022    8:24 AM  Depression screen PHQ 2/9  Decreased Interest 0 0 0 0 0  Down, Depressed, Hopeless 0 0 0 0 0  PHQ - 2 Score 0 0 0 0 0  Altered sleeping 0  1 1 0  Tired, decreased energy 0  1 0 0   Change in appetite 0  1 0 0  Feeling bad or failure about yourself  0  0 0 0  Trouble concentrating 0  0 0 0  Moving slowly or fidgety/restless 0  0 0 0  Suicidal thoughts 0  0 0 0  PHQ-9 Score 0  3 1 0  Difficult doing work/chores   Not difficult at all Not difficult at all Not difficult at all   Past Medical History:  Past Medical History:  Diagnosis Date   Carpal tunnel syndrome of right wrist    ED (erectile dysfunction)    GERD (gastroesophageal reflux disease)    History of dysplastic nevus 01/03/2021   left posterior ear, moderate atypia   Hypertension    Inguinal hernia, bilateral    Insomnia    Jones fracture    left foot   Rosacea    Sleep apnea    had UPPP surgery   Tobacco use    Ventral hernia    Wears contact lenses     Surgical History:  Past Surgical History:  Procedure Laterality Date   BACK SURGERY     lumbar   BREAST BIOPSY Left 06/22/2018   [place] bx BENIGN FIBROFATTY TISSUE WITH DEGENERATIVE CHANGES   HERNIA REPAIR     KNEE SURGERY     NASAL SINUS SURGERY  02/22/1998   UPPP, tonguepexy, septo.  Dr. Elenore Rota. [facility]   ORIF TOE FRACTURE Left 12/20/2015   Procedure: OPEN REDUCTION INTERNAL FIXATION (ORIF)  5TH METATARSAL (TOE) FRACTURE LEFT;  Surgeon: Recardo Evangelist, DPM;  Location: [facility];  Service: Podiatry;  Laterality: Left;  LMA WITH POPLITEAL   TONSILLECTOMY AND ADENOIDECTOMY     UVULOPALATOPHARYNGOPLASTY     VENTRAL HERNIA REPAIR N/A 05/14/2023   Procedure: HERNIA REPAIR VENTRAL ADULT, open;  Surgeon: Leafy Ro, MD;  Location: [facility];  Service: General;  Laterality: N/A;   XI ROBOTIC ASSISTED INGUINAL HERNIA REPAIR WITH MESH Bilateral 01/06/2020   Procedure: XI ROBOTIC ASSISTED INGUINAL HERNIA REPAIR WITH MESH;  Surgeon: Leafy Ro, MD;  Location: [facility];  Service: General;  Laterality: Bilateral;    Medications:  Current Outpatient Medications on File Prior to Visit  Medication Sig   Ascorbic Acid (VITAMIN C) 100 MG  tablet Take 100 mg by mouth daily.   calcium carbonate (TUMS - DOSED IN MG ELEMENTAL CALCIUM) 500 MG chewable tablet Chew 1 tablet by mouth as needed for indigestion or heartburn.   Dihydroxyaluminum Sod Carb (ROLAIDS PO) Take 1 tablet by mouth as needed.   No current facility-administered medications on file prior to visit.    Allergies:  No Known Allergies  Social History:  Social History   Socioeconomic History   Marital status: Single    Spouse name: Not on file   Number of children: Not on file   Years of education: Not on file   Highest education level: Not on file  Occupational History   Not on file  Tobacco Use   Smoking status: Every Day    Current packs/day: 1.50    Average packs/day: 1.5 packs/day for 15.0 years (22.5 ttl pk-yrs)    Types: Cigarettes    Passive exposure: Past   Smokeless tobacco: Former  Building services engineer status: Never Used  Substance and Sexual Activity   Alcohol use: Not Currently    Alcohol/week: 0.0 standard drinks of alcohol   Drug use: No   Sexual activity: Yes  Other Topics Concern   Not on file  Social History Narrative   Not on file   Social Drivers of Health   Financial Resource Strain: Not on file  Food Insecurity: Not on file  Transportation Needs: Not on file  Physical Activity: Not on file  Stress: Not on file  Social Connections: Not on file  Intimate Partner Violence: Not on file   Social History   Tobacco Use  Smoking Status Every Day   Current packs/day: 1.50   Average packs/day: 1.5 packs/day for 15.0 years (22.5 ttl pk-yrs)   Types: Cigarettes   Passive exposure: Past  Smokeless Tobacco Former   Social History   Substance and Sexual Activity  Alcohol Use Not Currently   Alcohol/week: 0.0 standard drinks of alcohol    Family History:  Family History  Problem Relation Age of Onset   Heart disease Father    Diabetes Paternal Grandfather    Heart disease Paternal Grandfather    Cancer Paternal  Grandfather        unknown location   Breast cancer Neg Hx    Colon cancer Neg Hx     Past medical history, surgical history, medications, allergies, family history and social history reviewed with patient today and changes made to appropriate areas of the chart.   Review of Systems  Constitutional: Negative.   HENT:  Positive for congestion. Negative for ear discharge, ear pain, hearing loss, nosebleeds, sinus pain, sore throat and tinnitus.   Eyes: Negative.   Respiratory: Negative.  Negative  for stridor.   Cardiovascular: Negative.   Gastrointestinal:  Positive for diarrhea. Negative for abdominal pain, blood in stool, constipation, heartburn, melena, nausea and vomiting.  Genitourinary:  Positive for frequency.  Musculoskeletal:  Positive for neck pain. Negative for back pain, falls, joint pain and myalgias.  Skin: Negative.   Neurological:  Positive for tingling. Negative for dizziness, tremors, sensory change, speech change, focal weakness, seizures, loss of consciousness, weakness and headaches.  Endo/Heme/Allergies:  Positive for environmental allergies. Negative for polydipsia. Bruises/bleeds easily.  Psychiatric/Behavioral: Negative.     All other ROS negative except what is listed above and in the HPI.      Objective:    BP 121/71 (BP Location: Left Arm, Patient Position: Sitting, Cuff Size: Normal)   Pulse 72   Ht 5' 9.5" (1.765 m)   Wt 184 lb 6.4 oz (83.6 kg)   SpO2 98%   BMI 26.84 kg/m   Wt Readings from Last 3 Encounters:  11/19/23 184 lb 6.4 oz (83.6 kg)  06/04/23 159 lb 9.6 oz (72.4 kg)  05/27/23 161 lb 6.4 oz (73.2 kg)    Physical Exam Vitals and nursing note reviewed.  Constitutional:      General: He is not in acute distress.    Appearance: Normal appearance. He is not ill-appearing, toxic-appearing or diaphoretic.  HENT:     Head: Normocephalic and atraumatic.     Right Ear: Tympanic membrane, ear canal and external ear normal. There is no impacted  cerumen.     Left Ear: Tympanic membrane, ear canal and external ear normal. There is no impacted cerumen.     Nose: Nose normal. No congestion or rhinorrhea.     Mouth/Throat:     Mouth: Mucous membranes are moist.     Pharynx: Oropharynx is clear. No oropharyngeal exudate or posterior oropharyngeal erythema.  Eyes:     General: No scleral icterus.       Right eye: No discharge.        Left eye: No discharge.     Extraocular Movements: Extraocular movements intact.     Conjunctiva/sclera: Conjunctivae normal.     Pupils: Pupils are equal, round, and reactive to light.  Neck:     Vascular: No carotid bruit.  Cardiovascular:     Rate and Rhythm: Normal rate and regular rhythm.     Pulses: Normal pulses.     Heart sounds: No murmur heard.    No friction rub. No gallop.  Pulmonary:     Effort: Pulmonary effort is normal. No respiratory distress.     Breath sounds: Normal breath sounds. No stridor. No wheezing, rhonchi or rales.  Chest:     Chest wall: No tenderness.  Abdominal:     General: Abdomen is flat. Bowel sounds are normal. There is no distension.     Palpations: Abdomen is soft. There is no mass.     Tenderness: There is no abdominal tenderness. There is no right CVA tenderness, left CVA tenderness, guarding or rebound.     Hernia: No hernia is present.  Genitourinary:    Comments: Genital exam deferred with shared decision making Musculoskeletal:        General: No swelling, tenderness, deformity or signs of injury.     Cervical back: Normal range of motion and neck supple. No rigidity. No muscular tenderness.     Right lower leg: No edema.     Left lower leg: No edema.  Lymphadenopathy:     Cervical: No cervical adenopathy.  Skin:    General: Skin is warm and dry.     Capillary Refill: Capillary refill takes less than 2 seconds.     Coloration: Skin is not jaundiced or pale.     Findings: No bruising, erythema, lesion or rash.  Neurological:     General: No focal  deficit present.     Mental Status: He is alert and oriented to person, place, and time.     Cranial Nerves: No cranial nerve deficit.     Sensory: No sensory deficit.     Motor: No weakness.     Coordination: Coordination normal.     Gait: Gait normal.     Deep Tendon Reflexes: Reflexes normal.  Psychiatric:        Mood and Affect: Mood normal.        Behavior: Behavior normal.        Thought Content: Thought content normal.        Judgment: Judgment normal.     Results for orders placed or performed in visit on 11/19/23  Microalbumin, Urine Waived   Collection Time: 11/19/23 11:18 AM  Result Value Ref Range   Microalb, Ur Waived 10 0 - 19 mg/L   Creatinine, Urine Waived 10 10 - 300 mg/dL   Microalb/Creat Ratio 30-300 (H) <30 mg/g      Assessment & Plan:   Problem List Items Addressed This Visit       Cardiovascular and Mediastinum   HTN (hypertension)   Under good control on current regimen. Continue current regimen. Continue to monitor. Call with any concerns. Refills given. Labs drawn today.        Relevant Medications   lisinopril (ZESTRIL) 10 MG tablet   tadalafil (CIALIS) 20 MG tablet   Other Relevant Orders   Microalbumin, Urine Waived (Completed)     Nervous and Auditory   Cervical radiculopathy   No better. Moving to [place]. Will put in referral to PM&R in [place]. Call with any concerns.       Relevant Medications   traZODone (DESYREL) 100 MG tablet   Other Relevant Orders   Ambulatory referral to Physical Medicine Rehab     Other   Insomnia   Under good control on current regimen. Continue current regimen. Continue to monitor. Call with any concerns. Refills given.        Other Visit Diagnoses       Routine general medical examination at a health care facility    -  Primary   Vaccines up to date/declined. Screening labs checked today. Cologuard declined. Continue diet and exercise. Call with any concerns.   Relevant Orders   Comprehensive  metabolic panel with GFR   CBC with Differential/Platelet   Lipid Panel w/o Chol/HDL Ratio   PSA   TSH        LABORATORY TESTING:  Health maintenance labs ordered today as discussed above.   The natural history of prostate cancer and ongoing controversy regarding screening and potential treatment outcomes of prostate cancer has been discussed with the patient. The meaning of a false positive PSA and a false negative PSA has been discussed. He indicates understanding of the limitations of this screening test and wishes to proceed with screening PSA testing.   IMMUNIZATIONS:   - Tdap: Tetanus vaccination status reviewed: last tetanus booster within 10 years. - Influenza: Refused - Pneumovax: Refused - Prevnar: Refused - COVID: Refused - HPV: Not applicable - Shingrix vaccine: Up to date  SCREENING: - Colonoscopy: Ordered today  Discussed with patient purpose of the colonoscopy is to detect colon cancer at curable precancerous or early stages   PATIENT COUNSELING:    Sexuality: Discussed sexually transmitted diseases, partner selection, use of condoms, avoidance of unintended pregnancy  and contraceptive alternatives.   Advised to avoid cigarette smoking.  I discussed with the patient that most people either abstain from alcohol or drink within safe limits (<=14/week and <=4 drinks/occasion for males, <=7/weeks and <= 3 drinks/occasion for females) and that the risk for alcohol disorders and other health effects rises proportionally with the number of drinks per week and how often a drinker exceeds daily limits.  Discussed cessation/primary prevention of drug use and availability of treatment for abuse.   Diet: Encouraged to adjust caloric intake to maintain  or achieve ideal body weight, to reduce intake of dietary saturated fat and total fat, to limit sodium intake by avoiding high sodium foods and not adding table salt, and to maintain adequate dietary potassium and calcium  preferably from fresh fruits, vegetables, and low-fat dairy products.    stressed the importance of regular exercise  Injury prevention: Discussed safety belts, safety helmets, smoke detector, smoking near bedding or upholstery.   Dental health: Discussed importance of regular tooth brushing, flossing, and dental visits.   Follow up plan: NEXT PREVENTATIVE PHYSICAL DUE IN 1 YEAR. Return Patient is moving to [facility]- follow up as needed.

## 2023-11-20 LAB — PSA: Prostate Specific Ag, Serum: 1 ng/mL (ref 0.0–4.0)

## 2023-11-20 LAB — CBC WITH DIFFERENTIAL/PLATELET
Basophils Absolute: 0 10*3/uL (ref 0.0–0.2)
Basos: 0 %
EOS (ABSOLUTE): 0.1 10*3/uL (ref 0.0–0.4)
Eos: 1 %
Hematocrit: 39 % (ref 37.5–51.0)
Hemoglobin: 13.1 g/dL (ref 13.0–17.7)
Immature Grans (Abs): 0 10*3/uL (ref 0.0–0.1)
Immature Granulocytes: 0 %
Lymphocytes Absolute: 2.3 10*3/uL (ref 0.7–3.1)
Lymphs: 31 %
MCH: 32.4 pg (ref 26.6–33.0)
MCHC: 33.6 g/dL (ref 31.5–35.7)
MCV: 97 fL (ref 79–97)
Monocytes Absolute: 0.5 10*3/uL (ref 0.1–0.9)
Monocytes: 7 %
Neutrophils Absolute: 4.3 10*3/uL (ref 1.4–7.0)
Neutrophils: 61 %
Platelets: 284 10*3/uL (ref 150–450)
RBC: 4.04 x10E6/uL — ABNORMAL LOW (ref 4.14–5.80)
RDW: 12.2 % (ref 11.6–15.4)
WBC: 7.2 10*3/uL (ref 3.4–10.8)

## 2023-11-20 LAB — COMPREHENSIVE METABOLIC PANEL WITH GFR
ALT: 15 IU/L (ref 0–44)
AST: 15 IU/L (ref 0–40)
Albumin: 4.5 g/dL (ref 3.8–4.9)
Alkaline Phosphatase: 58 IU/L (ref 44–121)
BUN/Creatinine Ratio: 11 (ref 9–20)
BUN: 9 mg/dL (ref 6–24)
Bilirubin Total: 0.3 mg/dL (ref 0.0–1.2)
CO2: 20 mmol/L (ref 20–29)
Calcium: 9.8 mg/dL (ref 8.7–10.2)
Chloride: 103 mmol/L (ref 96–106)
Creatinine, Ser: 0.84 mg/dL (ref 0.76–1.27)
Globulin, Total: 2.1 g/dL (ref 1.5–4.5)
Glucose: 127 mg/dL — ABNORMAL HIGH (ref 70–99)
Potassium: 4.4 mmol/L (ref 3.5–5.2)
Sodium: 139 mmol/L (ref 134–144)
Total Protein: 6.6 g/dL (ref 6.0–8.5)
eGFR: 105 mL/min/{1.73_m2} (ref >59)

## 2023-11-20 LAB — LIPID PANEL W/O CHOL/HDL RATIO
Cholesterol, Total: 219 mg/dL — ABNORMAL HIGH (ref 100–199)
HDL: 35 mg/dL — ABNORMAL LOW (ref >39)
LDL Chol Calc (NIH): 160 mg/dL — ABNORMAL HIGH (ref 0–99)
Triglycerides: 130 mg/dL (ref 0–149)
VLDL Cholesterol Cal: 24 mg/dL (ref 5–40)

## 2023-11-20 LAB — TSH: TSH: 0.579 u[IU]/mL (ref 0.450–4.500)

## 2023-11-24 ENCOUNTER — Encounter: Payer: Self-pay | Admitting: Family Medicine

## 2023-12-03 ENCOUNTER — Ambulatory Visit: Payer: Self-pay | Admitting: Family Medicine

## 2024-03-13 ENCOUNTER — Other Ambulatory Visit: Payer: Self-pay | Admitting: Family Medicine

## 2024-03-15 NOTE — Telephone Encounter (Signed)
 Unable to refill per protocol, Rx expired. Discontinued 01/19/23, dose change.  Requested Prescriptions  Pending Prescriptions Disp Refills   lisinopril  (ZESTRIL ) 5 MG tablet [Pharmacy Med Name: LISINOPRIL  5 MG TABLET] 90 tablet 1    Sig: TAKE 1 TABLET (5 MG TOTAL) BY MOUTH DAILY.     Cardiovascular:  ACE Inhibitors Passed - 03/15/2024 10:51 AM      Passed - Cr in normal range and within 180 days    Creatinine, Ser  Date Value Ref Range Status  11/19/2023 0.84 0.76 - 1.27 mg/dL Final         Passed - K in normal range and within 180 days    Potassium  Date Value Ref Range Status  11/19/2023 4.4 3.5 - 5.2 mmol/L Final         Passed - Patient is not pregnant      Passed - Last BP in normal range    BP Readings from Last 1 Encounters:  11/19/23 121/71         Passed - Valid encounter within last 6 months    Recent Outpatient Visits           3 months ago Routine general medical examination at a health care facility   [facility], Plover P, DO

## 2024-07-16 ENCOUNTER — Other Ambulatory Visit: Payer: Self-pay | Admitting: Family Medicine

## 2024-07-19 ENCOUNTER — Other Ambulatory Visit: Payer: Self-pay | Admitting: Family Medicine

## 2024-07-20 NOTE — Telephone Encounter (Signed)
 Requested Prescriptions  Pending Prescriptions Disp Refills   montelukast  (SINGULAIR ) 10 MG tablet [Pharmacy Med Name: MONTELUKAST  SOD 10 MG TABLET] 90 tablet 0    Sig: TAKE 1 TABLET BY MOUTH EVERYDAY AT BEDTIME     Pulmonology:  Leukotriene Inhibitors Passed - 07/20/2024  4:01 PM      Passed - Valid encounter within last 12 months    Recent Outpatient Visits           8 months ago Routine general medical examination at a health care facility   [facility], Megan P, DO               lisinopril  (ZESTRIL ) 10 MG tablet [Pharmacy Med Name: LISINOPRIL  10 MG TABLET] 90 tablet 0    Sig: TAKE 1 TABLET BY MOUTH EVERY DAY     Cardiovascular:  ACE Inhibitors Failed - 07/20/2024  4:01 PM      Failed - Cr in normal range and within 180 days    Creatinine, Ser  Date Value Ref Range Status  11/19/2023 0.84 0.76 - 1.27 mg/dL Final         Failed - K in normal range and within 180 days    Potassium  Date Value Ref Range Status  11/19/2023 4.4 3.5 - 5.2 mmol/L Final         Failed - Valid encounter within last 6 months    Recent Outpatient Visits           8 months ago Routine general medical examination at a health care facility   [facility], Pana, DO              Passed - Patient is not pregnant      Passed - Last BP in normal range    BP Readings from Last 1 Encounters:  11/19/23 121/71          naproxen  (NAPROSYN ) 500 MG tablet [Pharmacy Med Name: NAPROXEN  500 MG TABLET] 180 tablet 0    Sig: TAKE 1 TABLET BY MOUTH 2 TIMES DAILY WITH A MEAL.     Analgesics:  NSAIDS Failed - 07/20/2024  4:01 PM      Failed - Manual Review: Labs are only required if the patient has taken medication for more than 8 weeks.      Passed - Cr in normal range and within 360 days    Creatinine, Ser  Date Value Ref Range Status  11/19/2023 0.84 0.76 - 1.27 mg/dL Final         Passed - HGB in normal range and within 360  days    Hemoglobin  Date Value Ref Range Status  11/19/2023 13.1 13.0 - 17.7 g/dL Final         Passed - PLT in normal range and within 360 days    Platelets  Date Value Ref Range Status  11/19/2023 284 150 - 450 x10E3/uL Final         Passed - HCT in normal range and within 360 days    Hematocrit  Date Value Ref Range Status  11/19/2023 39.0 37.5 - 51.0 % Final         Passed - eGFR is 30 or above and within 360 days    GFR calc Af Amer  Date Value Ref Range Status  07/10/2020 132 >59 mL/min/1.73 Final    Comment:    **In accordance with recommendations from the NKF-ASN Task force,**  [facility] is in the process of updating its eGFR calculation to the   2021 CKD-EPI creatinine equation that estimates kidney function   without a race variable.    GFR calc non Af Amer  Date Value Ref Range Status  07/10/2020 114 >59 mL/min/1.73 Final   eGFR  Date Value Ref Range Status  11/19/2023 105 >59 mL/min/1.73 Final         Passed - Patient is not pregnant      Passed - Valid encounter within last 12 months    Recent Outpatient Visits           8 months ago Routine general medical examination at a health care facility   [facility], Megan P, DO

## 2024-09-09 ENCOUNTER — Encounter: Payer: Self-pay | Admitting: Certified Registered"

## 2024-09-09 ENCOUNTER — Encounter: Payer: Self-pay | Admitting: Vascular Surgery

## 2024-09-09 ENCOUNTER — Encounter: Admission: EM | Disposition: A | Payer: Self-pay | Source: Home / Self Care | Attending: Pulmonary Disease

## 2024-09-09 ENCOUNTER — Inpatient Hospital Stay

## 2024-09-09 ENCOUNTER — Other Ambulatory Visit: Payer: Self-pay

## 2024-09-09 ENCOUNTER — Telehealth: Payer: Self-pay | Admitting: Surgery

## 2024-09-09 ENCOUNTER — Inpatient Hospital Stay
Admission: EM | Admit: 2024-09-09 | Discharge: 2024-09-16 | Disposition: A | Source: Home / Self Care | Attending: Pulmonary Disease | Admitting: Pulmonary Disease

## 2024-09-09 ENCOUNTER — Emergency Department

## 2024-09-09 DIAGNOSIS — R531 Weakness: Secondary | ICD-10-CM

## 2024-09-09 DIAGNOSIS — K922 Gastrointestinal hemorrhage, unspecified: Secondary | ICD-10-CM | POA: Diagnosis not present

## 2024-09-09 DIAGNOSIS — R571 Hypovolemic shock: Secondary | ICD-10-CM | POA: Diagnosis not present

## 2024-09-09 DIAGNOSIS — N179 Acute kidney failure, unspecified: Secondary | ICD-10-CM

## 2024-09-09 DIAGNOSIS — E8721 Acute metabolic acidosis: Secondary | ICD-10-CM | POA: Diagnosis not present

## 2024-09-09 DIAGNOSIS — D62 Acute posthemorrhagic anemia: Secondary | ICD-10-CM

## 2024-09-09 DIAGNOSIS — D696 Thrombocytopenia, unspecified: Secondary | ICD-10-CM

## 2024-09-09 DIAGNOSIS — F1010 Alcohol abuse, uncomplicated: Secondary | ICD-10-CM

## 2024-09-09 DIAGNOSIS — R739 Hyperglycemia, unspecified: Secondary | ICD-10-CM | POA: Diagnosis not present

## 2024-09-09 DIAGNOSIS — J9601 Acute respiratory failure with hypoxia: Secondary | ICD-10-CM | POA: Diagnosis not present

## 2024-09-09 DIAGNOSIS — F17200 Nicotine dependence, unspecified, uncomplicated: Secondary | ICD-10-CM

## 2024-09-09 DIAGNOSIS — R578 Other shock: Secondary | ICD-10-CM

## 2024-09-09 LAB — GLUCOSE, CAPILLARY
Glucose-Capillary: 187 mg/dL — ABNORMAL HIGH (ref 70–99)
Glucose-Capillary: 220 mg/dL — ABNORMAL HIGH (ref 70–99)
Glucose-Capillary: 228 mg/dL — ABNORMAL HIGH (ref 70–99)
Glucose-Capillary: 312 mg/dL — ABNORMAL HIGH (ref 70–99)
Glucose-Capillary: 350 mg/dL — ABNORMAL HIGH (ref 70–99)
Glucose-Capillary: 407 mg/dL — ABNORMAL HIGH (ref 70–99)
Glucose-Capillary: 487 mg/dL — ABNORMAL HIGH (ref 70–99)

## 2024-09-09 LAB — COMPREHENSIVE METABOLIC PANEL WITH GFR
ALT: 12 U/L (ref 0–44)
ALT: 18 U/L (ref 0–44)
AST: 21 U/L (ref 15–41)
AST: 31 U/L (ref 15–41)
Albumin: 3.1 g/dL — ABNORMAL LOW (ref 3.5–5.0)
Albumin: 1.8 g/dL — ABNORMAL LOW (ref 3.5–5.0)
Alkaline Phosphatase: 42 U/L (ref 38–126)
Alkaline Phosphatase: 28 U/L — ABNORMAL LOW (ref 38–126)
Anion gap: 16 — ABNORMAL HIGH (ref 5–15)
Anion gap: 22 — ABNORMAL HIGH (ref 5–15)
BUN: 37 mg/dL — ABNORMAL HIGH (ref 6–20)
BUN: 38 mg/dL — ABNORMAL HIGH (ref 6–20)
CO2: 15 mmol/L — ABNORMAL LOW (ref 22–32)
CO2: 20 mmol/L — ABNORMAL LOW (ref 22–32)
Calcium: 8.2 mg/dL — ABNORMAL LOW (ref 8.9–10.3)
Calcium: 8.5 mg/dL — ABNORMAL LOW (ref 8.9–10.3)
Chloride: 97 mmol/L — ABNORMAL LOW (ref 98–111)
Chloride: 95 mmol/L — ABNORMAL LOW (ref 98–111)
Creatinine, Ser: 1.07 mg/dL (ref 0.61–1.24)
Creatinine, Ser: 1.37 mg/dL — ABNORMAL HIGH (ref 0.61–1.24)
GFR, Estimated: >60 mL/min
GFR, Estimated: >60 mL/min
Glucose, Bld: 164 mg/dL — ABNORMAL HIGH (ref 70–99)
Glucose, Bld: 437 mg/dL — ABNORMAL HIGH (ref 70–99)
Potassium: 4.2 mmol/L (ref 3.5–5.1)
Potassium: 4.4 mmol/L (ref 3.5–5.1)
Sodium: 128 mmol/L — ABNORMAL LOW (ref 135–145)
Sodium: 138 mmol/L (ref 135–145)
Total Bilirubin: 0.2 mg/dL (ref 0.0–1.2)
Total Bilirubin: 0.4 mg/dL (ref 0.0–1.2)
Total Protein: 5.3 g/dL — ABNORMAL LOW (ref 6.5–8.1)
Total Protein: <3 g/dL — ABNORMAL LOW (ref 6.5–8.1)

## 2024-09-09 LAB — CBC
HCT: 32.9 % — ABNORMAL LOW (ref 39.0–52.0)
HCT: 34.6 % — ABNORMAL LOW (ref 39.0–52.0)
Hemoglobin: 10.8 g/dL — ABNORMAL LOW (ref 13.0–17.0)
Hemoglobin: 12.4 g/dL — ABNORMAL LOW (ref 13.0–17.0)
MCH: 29.6 pg (ref 26.0–34.0)
MCH: 30.2 pg (ref 26.0–34.0)
MCHC: 32.8 g/dL (ref 30.0–36.0)
MCHC: 35.8 g/dL (ref 30.0–36.0)
MCV: 90.1 fL (ref 80.0–100.0)
MCV: 84.4 fL (ref 80.0–100.0)
Platelets: 26 10*3/uL — CL (ref 150–400)
Platelets: 48 10*3/uL — ABNORMAL LOW (ref 150–400)
RBC: 3.65 MIL/uL — ABNORMAL LOW (ref 4.22–5.81)
RBC: 4.1 MIL/uL — ABNORMAL LOW (ref 4.22–5.81)
RDW: 14.9 % (ref 11.5–15.5)
RDW: 14.4 % (ref 11.5–15.5)
WBC: 8 10*3/uL (ref 4.0–10.5)
WBC: 8.3 10*3/uL (ref 4.0–10.5)
nRBC: 0 % (ref 0.0–0.2)
nRBC: 0 % (ref 0.0–0.2)

## 2024-09-09 LAB — CBC WITH DIFFERENTIAL/PLATELET
Abs Immature Granulocytes: 0.1 10*3/uL — ABNORMAL HIGH (ref 0.00–0.07)
Basophils Absolute: 0 10*3/uL (ref 0.0–0.1)
Basophils Relative: 0 %
Eosinophils Absolute: 0 10*3/uL (ref 0.0–0.5)
Eosinophils Relative: 0 %
HCT: 18.7 % — ABNORMAL LOW (ref 39.0–52.0)
Hemoglobin: 6.2 g/dL — ABNORMAL LOW (ref 13.0–17.0)
Immature Granulocytes: 1 %
Lymphocytes Relative: 12 %
Lymphs Abs: 1.6 10*3/uL (ref 0.7–4.0)
MCH: 35.2 pg — ABNORMAL HIGH (ref 26.0–34.0)
MCHC: 33.2 g/dL (ref 30.0–36.0)
MCV: 106.3 fL — ABNORMAL HIGH (ref 80.0–100.0)
Monocytes Absolute: 0.7 10*3/uL (ref 0.1–1.0)
Monocytes Relative: 6 %
Neutro Abs: 10.9 10*3/uL — ABNORMAL HIGH (ref 1.7–7.7)
Neutrophils Relative %: 81 %
Platelets: 249 10*3/uL (ref 150–400)
RBC: 1.76 MIL/uL — ABNORMAL LOW (ref 4.22–5.81)
RDW: 13.4 % (ref 11.5–15.5)
WBC: 13.3 10*3/uL — ABNORMAL HIGH (ref 4.0–10.5)
nRBC: 0 % (ref 0.0–0.2)

## 2024-09-09 LAB — BLOOD GAS, ARTERIAL
Acid-base deficit: 7 mmol/L — ABNORMAL HIGH (ref 0.0–2.0)
Bicarbonate: 19.7 mmol/L — ABNORMAL LOW (ref 20.0–28.0)
FIO2: 40 %
MECHVT: 450 mL
O2 Saturation: 99.3 %
PEEP: 5 cmH2O
Patient temperature: 37
RATE: 18 {breaths}/min
pCO2 arterial: 43 mmHg (ref 32–48)
pH, Arterial: 7.27 — ABNORMAL LOW (ref 7.35–7.45)
pO2, Arterial: 158 mmHg — ABNORMAL HIGH (ref 83–108)

## 2024-09-09 LAB — DIC (DISSEMINATED INTRAVASCULAR COAGULATION)PANEL
D-Dimer, Quant: 0.54 ug{FEU}/mL — ABNORMAL HIGH (ref 0.00–0.50)
D-Dimer, Quant: 0.61 ug{FEU}/mL — ABNORMAL HIGH (ref 0.00–0.50)
Fibrinogen: 269 mg/dL (ref 210–475)
Fibrinogen: 227 mg/dL (ref 210–475)
INR: 1.2 (ref 0.8–1.2)
INR: 1.2 (ref 0.8–1.2)
Platelets: 115 10*3/uL — ABNORMAL LOW (ref 150–400)
Platelets: 102 10*3/uL — ABNORMAL LOW (ref 150–400)
Prothrombin Time: 15.5 s — ABNORMAL HIGH (ref 11.4–15.2)
Prothrombin Time: 15.4 s — ABNORMAL HIGH (ref 11.4–15.2)
Smear Review: NONE SEEN
Smear Review: NONE SEEN
aPTT: 32 s (ref 24–36)
aPTT: 32 s (ref 24–36)

## 2024-09-09 LAB — PREPARE RBC (CROSSMATCH)

## 2024-09-09 LAB — DIFFERENTIAL
Abs Immature Granulocytes: 0.2 10*3/uL — ABNORMAL HIGH (ref 0.00–0.07)
Basophils Absolute: 0 10*3/uL (ref 0.0–0.1)
Basophils Relative: 0 %
Eosinophils Absolute: 0 10*3/uL (ref 0.0–0.5)
Eosinophils Relative: 0 %
Immature Granulocytes: 3 %
Lymphocytes Relative: 13 %
Lymphs Abs: 1 10*3/uL (ref 0.7–4.0)
Monocytes Absolute: 0.8 10*3/uL (ref 0.1–1.0)
Monocytes Relative: 9 %
Neutro Abs: 6 10*3/uL (ref 1.7–7.7)
Neutrophils Relative %: 75 %

## 2024-09-09 LAB — BASIC METABOLIC PANEL WITH GFR
Anion gap: 8 (ref 5–15)
BUN: 44 mg/dL — ABNORMAL HIGH (ref 6–20)
CO2: 27 mmol/L (ref 22–32)
Calcium: 7.8 mg/dL — ABNORMAL LOW (ref 8.9–10.3)
Chloride: 103 mmol/L (ref 98–111)
Creatinine, Ser: 1.15 mg/dL (ref 0.61–1.24)
GFR, Estimated: >60 mL/min
Glucose, Bld: 231 mg/dL — ABNORMAL HIGH (ref 70–99)
Potassium: 4.8 mmol/L (ref 3.5–5.1)
Sodium: 138 mmol/L (ref 135–145)

## 2024-09-09 LAB — TECHNOLOGIST SMEAR REVIEW: Plt Morphology: DECREASED

## 2024-09-09 LAB — PROTIME-INR
INR: 1.1 (ref 0.8–1.2)
INR: 2 — ABNORMAL HIGH (ref 0.8–1.2)
INR: 1.2 (ref 0.8–1.2)
Prothrombin Time: 14.3 s (ref 11.4–15.2)
Prothrombin Time: 23.5 s — ABNORMAL HIGH (ref 11.4–15.2)
Prothrombin Time: 16 s — ABNORMAL HIGH (ref 11.4–15.2)

## 2024-09-09 LAB — CBG MONITORING, ED: Glucose-Capillary: 145 mg/dL — ABNORMAL HIGH (ref 70–99)

## 2024-09-09 LAB — MAGNESIUM: Magnesium: 2 mg/dL (ref 1.7–2.4)

## 2024-09-09 LAB — HEMOGLOBIN AND HEMATOCRIT, BLOOD
HCT: 29 % — ABNORMAL LOW (ref 39.0–52.0)
HCT: 25 % — ABNORMAL LOW (ref 39.0–52.0)
HCT: 30 % — ABNORMAL LOW (ref 39.0–52.0)
Hemoglobin: 9.6 g/dL — ABNORMAL LOW (ref 13.0–17.0)
Hemoglobin: 8.4 g/dL — ABNORMAL LOW (ref 13.0–17.0)
Hemoglobin: 10 g/dL — ABNORMAL LOW (ref 13.0–17.0)

## 2024-09-09 LAB — MRSA NEXT GEN BY PCR, NASAL: MRSA by PCR Next Gen: DETECTED — AB

## 2024-09-09 LAB — APTT: aPTT: 27 s (ref 24–36)

## 2024-09-09 LAB — LACTIC ACID, PLASMA
Lactic Acid, Venous: >9 mmol/L (ref 0.5–1.9)
Lactic Acid, Venous: 3.7 mmol/L (ref 0.5–1.9)

## 2024-09-09 LAB — FIBRINOGEN
Fibrinogen: 112 mg/dL — ABNORMAL LOW (ref 210–475)
Fibrinogen: 237 mg/dL (ref 210–475)

## 2024-09-09 LAB — ABO/RH: ABO/RH(D): A POS

## 2024-09-09 LAB — MASSIVE TRANSFUSION PROTOCOL ORDER (BLOOD BANK NOTIFICATION)

## 2024-09-09 LAB — LIPASE, BLOOD: Lipase: 58 U/L — ABNORMAL HIGH (ref 11–51)

## 2024-09-09 MED ORDER — PROPOFOL 1000 MG/100ML IV EMUL
0.0000 ug/kg/min | INTRAVENOUS | Status: DC
  Administered 2024-09-09 – 2024-09-10 (×3): 40 ug/kg/min via INTRAVENOUS
  Filled 2024-09-09 (×3): qty 100

## 2024-09-09 MED ORDER — VASOPRESSIN 20 UNITS/100 ML INFUSION FOR SHOCK: 0.0000 [IU]/min | INTRAVENOUS | Status: DC

## 2024-09-09 MED ORDER — EPINEPHRINE 1 MG/10ML IV SOSY
0.5000 mg | PREFILLED_SYRINGE | Freq: Once | INTRAVENOUS | Status: AC
  Administered 2024-09-09: 0.5 mg via INTRAVENOUS

## 2024-09-09 MED ORDER — SODIUM CHLORIDE 0.9 % IV SOLN: 250.0000 mL | INTRAVENOUS | Status: AC | PRN

## 2024-09-09 MED ORDER — SODIUM BICARBONATE 8.4 % IV SOLN
50.0000 meq | Freq: Once | INTRAVENOUS | Status: AC
  Administered 2024-09-09: 50 meq via INTRAVENOUS

## 2024-09-09 MED ORDER — IOHEXOL 350 MG/ML SOLN
100.0000 mL | Freq: Once | INTRAVENOUS | Status: AC | PRN
  Administered 2024-09-09: 100 mL via INTRAVENOUS

## 2024-09-09 MED ORDER — PANTOPRAZOLE SODIUM 40 MG IV SOLR
40.0000 mg | Freq: Two times a day (BID) | INTRAVENOUS | Status: DC
  Administered 2024-09-09 – 2024-09-15 (×12): 40 mg via INTRAVENOUS
  Filled 2024-09-09 (×12): qty 10

## 2024-09-09 MED ORDER — SODIUM BICARBONATE 8.4 % IV SOLN
INTRAVENOUS | Status: AC
  Administered 2024-09-09: 50 meq via INTRAVENOUS
  Filled 2024-09-09: qty 100

## 2024-09-09 MED ORDER — EPINEPHRINE HCL 5 MG/250ML IV SOLN IN NS
0.5000 ug/min | INTRAVENOUS | Status: DC
  Administered 2024-09-09: 0.5 ug/min via INTRAVENOUS
  Filled 2024-09-09 (×3): qty 250

## 2024-09-09 MED ORDER — SODIUM CHLORIDE 0.9% IV SOLUTION: Freq: Once | INTRAVENOUS | Status: DC

## 2024-09-09 MED ORDER — SODIUM CHLORIDE 0.9 % IV BOLUS
1000.0000 mL | Freq: Once | INTRAVENOUS | Status: AC
  Administered 2024-09-09: 1000 mL via INTRAVENOUS

## 2024-09-09 MED ORDER — SODIUM CHLORIDE 0.9% FLUSH: 3.0000 mL | INTRAVENOUS | Status: DC | PRN

## 2024-09-09 MED ORDER — IOHEXOL 300 MG/ML  SOLN
100.0000 mL | Freq: Once | INTRAMUSCULAR | Status: AC | PRN
  Administered 2024-09-09: 100 mL via INTRAVENOUS

## 2024-09-09 MED ORDER — FENTANYL CITRATE (PF) 50 MCG/ML IJ SOSY
50.0000 ug | PREFILLED_SYRINGE | INTRAMUSCULAR | Status: DC | PRN
  Administered 2024-09-09 (×2): 50 ug via INTRAVENOUS
  Filled 2024-09-09: qty 1

## 2024-09-09 MED ORDER — FENTANYL CITRATE (PF) 50 MCG/ML IJ SOSY
50.0000 ug | PREFILLED_SYRINGE | INTRAMUSCULAR | Status: DC | PRN
  Administered 2024-09-09: 50 ug via INTRAVENOUS
  Filled 2024-09-09: qty 2

## 2024-09-09 MED ORDER — EPINEPHRINE 1 MG/10ML IV SOSY
PREFILLED_SYRINGE | INTRAVENOUS | Status: AC
  Administered 2024-09-09: 0.5 mg via INTRAVENOUS
  Filled 2024-09-09: qty 10

## 2024-09-09 MED ORDER — ORAL CARE MOUTH RINSE
15.0000 mL | OROMUCOSAL | Status: DC
  Administered 2024-09-09 – 2024-09-10 (×8): 15 mL via OROMUCOSAL

## 2024-09-09 MED ORDER — VASOPRESSIN 20 UNIT/ML IV SOLN
0.0000 [IU]/min | INTRAVENOUS | Status: DC
  Filled 2024-09-09: qty 2.5

## 2024-09-09 MED ORDER — HYDROCORTISONE SOD SUC (PF) 100 MG IJ SOLR
100.0000 mg | Freq: Once | INTRAMUSCULAR | Status: AC
  Administered 2024-09-09: 100 mg via INTRAVENOUS
  Filled 2024-09-09: qty 2

## 2024-09-09 MED ORDER — SODIUM CHLORIDE 0.9% FLUSH
3.0000 mL | Freq: Two times a day (BID) | INTRAVENOUS | Status: DC
  Administered 2024-09-09 – 2024-09-16 (×14): 3 mL via INTRAVENOUS

## 2024-09-09 MED ORDER — VASOPRESSIN 20 UNIT/ML IV SOLN
40.0000 [IU] | Freq: Once | INTRAVENOUS | Status: DC
  Filled 2024-09-09: qty 2

## 2024-09-09 MED ORDER — HEPARIN (PORCINE) IN NACL 2000-0.9 UNIT/L-% IV SOLN
INTRAVENOUS | Status: DC | PRN
  Administered 2024-09-09: 1000 mL

## 2024-09-09 MED ORDER — ORAL CARE MOUTH RINSE: 15.0000 mL | OROMUCOSAL | Status: DC | PRN

## 2024-09-09 MED ORDER — OCTREOTIDE LOAD VIA INFUSION
50.0000 ug | Freq: Once | INTRAVENOUS | Status: AC
  Administered 2024-09-09: 50 ug via INTRAVENOUS
  Filled 2024-09-09: qty 25

## 2024-09-09 MED ORDER — POLYETHYLENE GLYCOL 3350 17 G PO PACK: 17.0000 g | PACK | Freq: Every day | ORAL | Status: DC

## 2024-09-09 MED ORDER — IODIXANOL 320 MG/ML IV SOLN
INTRAVENOUS | Status: DC | PRN
  Administered 2024-09-09: 35 mL

## 2024-09-09 MED ORDER — SODIUM CHLORIDE 0.9 % IV SOLN
50.0000 ug/h | INTRAVENOUS | Status: DC
  Administered 2024-09-09: 50 ug/h via INTRAVENOUS
  Filled 2024-09-09 (×4): qty 1

## 2024-09-09 MED ORDER — CHLORHEXIDINE GLUCONATE CLOTH 2 % EX PADS
6.0000 | MEDICATED_PAD | Freq: Every day | CUTANEOUS | Status: DC
  Administered 2024-09-10 – 2024-09-13 (×4): 6 via TOPICAL
  Filled 2024-09-09: qty 6

## 2024-09-09 MED ORDER — FENTANYL 2500MCG IN NS 250ML (10MCG/ML) PREMIX INFUSION
0.0000 ug/h | INTRAVENOUS | Status: DC
  Administered 2024-09-09: 25 ug/h via INTRAVENOUS
  Administered 2024-09-10: 100 ug/h via INTRAVENOUS
  Filled 2024-09-09: qty 250

## 2024-09-09 MED ORDER — NOREPINEPHRINE 4 MG/250ML-% IV SOLN
INTRAVENOUS | Status: AC
  Filled 2024-09-09: qty 250

## 2024-09-09 MED ORDER — SENNA 8.6 MG PO TABS: 1.0000 | ORAL_TABLET | Freq: Two times a day (BID) | ORAL | Status: DC | PRN

## 2024-09-09 MED ORDER — CALCIUM CHLORIDE 10 % IV SOLN
1.0000 g | Freq: Once | INTRAVENOUS | Status: AC
  Administered 2024-09-09: 1 g via INTRAVENOUS

## 2024-09-09 MED ORDER — PANTOPRAZOLE SODIUM 40 MG IV SOLR
40.0000 mg | Freq: Once | INTRAVENOUS | Status: AC
  Administered 2024-09-09: 40 mg via INTRAVENOUS
  Filled 2024-09-09: qty 10

## 2024-09-09 MED ORDER — INSULIN REGULAR(HUMAN) IN NACL 100-0.9 UT/100ML-% IV SOLN
INTRAVENOUS | Status: DC
  Administered 2024-09-09: 9.5 [IU]/h via INTRAVENOUS
  Filled 2024-09-09: qty 100

## 2024-09-09 MED ORDER — EPINEPHRINE PF 1 MG/ML IJ SOLN
INTRAMUSCULAR | Status: AC
  Filled 2024-09-09: qty 1

## 2024-09-09 MED ORDER — INSULIN ASPART 100 UNIT/ML IJ SOLN: 0.0000 [IU] | INTRAMUSCULAR | Status: DC

## 2024-09-09 MED ORDER — LEVETIRACETAM (KEPPRA) 500 MG/5 ML ADULT IV PUSH: 1500.0000 mg | Freq: Once | INTRAVENOUS | Status: DC

## 2024-09-09 MED ORDER — VASOPRESSIN 20 UNIT/ML IV SOLN: 0.0000 [IU]/min | INTRAVENOUS | Status: DC

## 2024-09-09 MED ORDER — PROPOFOL 1000 MG/100ML IV EMUL
INTRAVENOUS | Status: AC
  Administered 2024-09-09: 20 ug/kg/min via INTRAVENOUS
  Filled 2024-09-09: qty 100

## 2024-09-09 MED ORDER — SENNA 8.6 MG PO TABS
1.0000 | ORAL_TABLET | Freq: Two times a day (BID) | ORAL | Status: DC
  Administered 2024-09-10: 8.6 mg
  Filled 2024-09-09: qty 1

## 2024-09-09 MED ORDER — SODIUM CHLORIDE 0.9 % IV SOLN
1.0000 g | Freq: Once | INTRAVENOUS | Status: AC
  Administered 2024-09-09: 1 g via INTRAVENOUS
  Filled 2024-09-09: qty 10

## 2024-09-09 MED ORDER — SODIUM CHLORIDE 0.9 % IV SOLN
2.0000 g | INTRAVENOUS | Status: AC
  Administered 2024-09-10 – 2024-09-14 (×5): 2 g via INTRAVENOUS
  Filled 2024-09-09 (×5): qty 20

## 2024-09-09 MED ORDER — MIDAZOLAM HCL (PF) 2 MG/2ML IJ SOLN
1.0000 mg | INTRAMUSCULAR | Status: DC | PRN
  Administered 2024-09-09 – 2024-09-10 (×4): 2 mg via INTRAVENOUS
  Filled 2024-09-09 (×5): qty 2

## 2024-09-09 MED ORDER — PANTOPRAZOLE SODIUM 40 MG IV SOLR
40.0000 mg | Freq: Once | INTRAVENOUS | Status: AC
  Administered 2024-09-10: 40 mg via INTRAVENOUS

## 2024-09-09 MED ORDER — VASOPRESSIN 20 UNITS/100 ML INFUSION FOR SHOCK
0.0000 [IU]/min | INTRAVENOUS | Status: DC
  Administered 2024-09-09 – 2024-09-10 (×2): 0.04 [IU]/min via INTRAVENOUS
  Filled 2024-09-09 (×2): qty 100

## 2024-09-09 MED ORDER — VASOPRESSIN 20 UNIT/ML IV SOLN
0.0000 [IU]/min | INTRAVENOUS | Status: DC
  Filled 2024-09-09 (×4): qty 2.5

## 2024-09-09 MED ORDER — LEVETIRACETAM (KEPPRA) 500 MG/5 ML ADULT IV PUSH
500.0000 mg | Freq: Two times a day (BID) | INTRAVENOUS | Status: DC
  Administered 2024-09-09 – 2024-09-10 (×2): 500 mg via INTRAVENOUS
  Filled 2024-09-09 (×2): qty 5

## 2024-09-09 MED ORDER — SODIUM CHLORIDE 0.9 % IV SOLN: INTRAVENOUS | Status: AC

## 2024-09-09 MED ORDER — NOREPINEPHRINE 4 MG/250ML-% IV SOLN
0.0000 ug/min | INTRAVENOUS | Status: DC
  Administered 2024-09-09: 40 ug/min via INTRAVENOUS
  Administered 2024-09-09: 5 ug/min via INTRAVENOUS
  Administered 2024-09-09: 32 ug/min via INTRAVENOUS
  Administered 2024-09-09: 16 ug/min via INTRAVENOUS
  Administered 2024-09-10: 10 ug/min via INTRAVENOUS
  Administered 2024-09-10: 8 ug/min via INTRAVENOUS
  Administered 2024-09-10: 16 ug/min via INTRAVENOUS
  Filled 2024-09-09 (×4): qty 250

## 2024-09-09 MED ORDER — ETOMIDATE 2 MG/ML IV SOLN
INTRAVENOUS | Status: AC
  Filled 2024-09-09: qty 10

## 2024-09-09 MED ORDER — ROCURONIUM BROMIDE 10 MG/ML (PF) SYRINGE
PREFILLED_SYRINGE | INTRAVENOUS | Status: AC
  Filled 2024-09-09: qty 10

## 2024-09-09 MED ORDER — POLYETHYLENE GLYCOL 3350 17 G PO PACK: 17.0000 g | PACK | Freq: Every day | ORAL | Status: DC | PRN

## 2024-09-09 MED ORDER — VASOPRESSIN 20 UNITS/100 ML INFUSION FOR SHOCK
0.0000 [IU]/min | INTRAVENOUS | Status: DC
  Administered 2024-09-09: 0.4 [IU]/min via INTRAVENOUS
  Filled 2024-09-09 (×2): qty 100

## 2024-09-09 MED ORDER — SODIUM CHLORIDE 0.9 % IV SOLN: 10.0000 mL/h | Freq: Once | INTRAVENOUS | Status: DC

## 2024-09-09 MED ORDER — LIDOCAINE HCL (PF) 1 % IJ SOLN
INTRAMUSCULAR | Status: DC | PRN
  Administered 2024-09-09 (×2): 10 mL

## 2024-09-09 MED ORDER — DEXTROSE 50 % IV SOLN: 0.0000 mL | INTRAVENOUS | Status: DC | PRN

## 2024-09-09 MED ORDER — CALCIUM CHLORIDE 10 % IV SOLN
INTRAVENOUS | Status: AC
  Filled 2024-09-09: qty 20

## 2024-09-09 MED ORDER — STERILE WATER FOR INJECTION IV SOLN
INTRAVENOUS | Status: DC
  Filled 2024-09-09 (×3): qty 1000

## 2024-09-09 MED ORDER — FENTANYL BOLUS VIA INFUSION
25.0000 ug | INTRAVENOUS | Status: DC | PRN
  Administered 2024-09-09: 50 ug via INTRAVENOUS

## 2024-09-09 MED ORDER — DEXMEDETOMIDINE HCL IN NACL 400 MCG/100ML IV SOLN
0.0000 ug/kg/h | INTRAVENOUS | Status: DC
  Administered 2024-09-09: 0.4 ug/kg/h via INTRAVENOUS
  Administered 2024-09-09: 1 ug/kg/h via INTRAVENOUS
  Filled 2024-09-09 (×2): qty 100

## 2024-09-09 MED FILL — Sodium Chloride IV Soln 0.9%: INTRAVENOUS | Qty: 250 | Status: AC

## 2024-09-09 MED FILL — Fentanyl Citrate Preservative Free (PF) Inj 2500 MCG/50ML: INTRAMUSCULAR | Qty: 50 | Status: AC

## 2024-09-09 NOTE — ED Notes (Signed)
 Versed  2 mg given at 1205

## 2024-09-09 NOTE — Procedures (Signed)
 Arterial Catheter Insertion Procedure Note  [name]  [id number]  [birth year]  Date:09/09/24  Time:4:21 PM    Provider Performing: Inge JONETTA Lecher    Procedure: Insertion of Arterial Line (63379) with US  guidance (23062)   Indication(s) Blood pressure monitoring and/or need for frequent ABGs  Consent Unable to obtain consent due to emergent nature of procedure.  Anesthesia None   Time Out Verified patient identification, verified procedure, site/side was marked, verified correct patient position, special equipment/implants available, medications/allergies/relevant history reviewed, required imaging and test results available.   Sterile Technique Maximal sterile technique including full sterile barrier drape, hand hygiene, sterile gown, sterile gloves, mask, hair covering, sterile ultrasound probe cover (if used).   Procedure Description Area of catheter insertion was cleaned with chlorhexidine  and draped in sterile fashion. With real-time ultrasound guidance an arterial catheter was placed into the left femoral artery.  Appropriate arterial tracings confirmed on monitor.     Complications/Tolerance None; patient tolerated the procedure well.   EBL Minimal   Specimen(s) None   BIOPATCH applied to the insertion site.    Inge Lecher, AGACNP-BC [facility] Prefer epic messenger for cross cover needs If after hours, please call E-link

## 2024-09-09 NOTE — ED Notes (Signed)
 Pt to CT with ICU charge RN, ICU MD, NP, RT, and Victory, RN

## 2024-09-09 NOTE — Anesthesia Preprocedure Evaluation (Signed)
"                                    Anesthesia Evaluation  Patient identified by MRN, date of birth, ID band Patient awake    Reviewed: Allergy & Precautions, H&P , NPO status , Patient's Chart, lab work & pertinent test results  Airway Mallampati: II  TM Distance: >3 FB Neck ROM: Full    Dental no notable dental hx. (+) Teeth Intact   Pulmonary neg pulmonary ROS, Current Smoker and Patient abstained from smoking.   Pulmonary exam normal breath sounds clear to auscultation       Cardiovascular hypertension, negative cardio ROS Normal cardiovascular exam Rhythm:Regular Rate:Normal     Neuro/Psych negative neurological ROS  negative psych ROS   GI/Hepatic negative GI ROS, Neg liver ROS,,,  Endo/Other  negative endocrine ROS    Renal/GU negative Renal ROS  negative genitourinary   Musculoskeletal negative musculoskeletal ROS (+)    Abdominal   Peds negative pediatric ROS (+)  Hematology negative hematology ROS (+)   Anesthesia Other Findings   Reproductive/Obstetrics negative OB ROS                              Anesthesia Physical Anesthesia Plan  ASA: 2  Anesthesia Plan: General   Post-op Pain Management:    Induction: Intravenous  PONV Risk Score and Plan:   Airway Management Planned:   Additional Equipment:   Intra-op Plan:   Post-operative Plan: Extubation in OR  Informed Consent: I have reviewed the patients History and Physical, chart, labs and discussed the procedure including the risks, benefits and alternatives for the proposed anesthesia with the patient or authorized representative who has indicated his/her understanding and acceptance.     Dental advisory given  Plan Discussed with: CRNA  Anesthesia Plan Comments:         Anesthesia Quick Evaluation  "

## 2024-09-09 NOTE — Telephone Encounter (Signed)
 NA

## 2024-09-09 NOTE — Progress Notes (Signed)
 PM labs reviewed, lactic acid greater than 9 which is expected after his prolonged hypotension possibly shock liver and decreased clearance.  Also fibrinogen is low at 121, platelets significantly decreased at 26 and his INR increased to 2.0.  Possibly developing DIC.  Will plan to transfuse for hemoglobin greater than 7 g/dL, platelets greater than 50,000 and transfuse cryo for fibrinogen greater than 150.  Will need to repeat labs every 6 hours including CBC, BMP and lactic acid.  Continue weaning pressors as tolerated once Nor epi less than 10 mcg/min can turn off vasopressin  and then turn off epinephrine .  Will plan for an SAT in the morning.  Additional critical care time: 30 minutes.  Darrin Barn, MD [facility] 09/09/2024 6:03 PM

## 2024-09-09 NOTE — H&P (Signed)
 "  NAME:  Edward Mckenzie, MRN:  [id number], DOB:  Jan 14, 1971, LOS: 0 ADMISSION DATE:  09/09/2024, CONSULTATION DATE:  09/09/2024 REFERRING MD:  Dr. Willo, CHIEF COMPLAINT:  Acute GI Bleeding   Brief Pt Description / Synopsis:  54 y.o. male with PMHx significant for hypertension and GERD who is admitted with Hemorrhagic Shock due to massive Acute Upper GI Bleed requiring massive transfusion protocol and Coil Embolization of the Gastroduodenal Artery.  History of Present Illness:  Edward Mckenzie is a 54 y.o. male with past medical history of hypertension and GERD who presents to the ED complaining of GI bleeding.  Patient reports that over the past 36 hours he has been having regular dark black bowel movements as well as multiple episodes of vomiting blood.  He states that the vomiting blood had been more severe this morning and so he eventually decided to call EMS.  He denies any history of similar symptoms, denies any history of liver disease but does drink 12-15 beers daily.  He does not take a blood thinner, reports taking Tylenol  PM daily but does not take any NSAIDs.  Per EMS, significant amount of blood and dark black stool seen around the toilet and floor of his bathroom on scene.  He was given 500 cc IV fluids prior to arrival.   ED Course: Initial Vital Signs: Temperature 96.3 F, blood pressure 82/27, respiratory rate 17, pulse 108, SpO2 100% on room air Significant Labs: Sodium 128, bicarb 15, glucose 164, BUN 37, creatinine 1.07, anion gap 16, WBC 13.3, hemoglobin 6.2, hematocrit 18.7 Imaging CT Abdomen & Pelvis>>IMPRESSION: 1. No acute findings in the abdomen or pelvis. 2. Bilateral adrenal nodules. These probably represent benign adenomas. According to consensus criteria, more definitive characterization with no-emergent adrenal washout CT or MRI is advised. Medications Administered: Octreotide  drip, ceftriaxone , Protonix  IV, 1 L fluid bolus,  While in the ED was  severely hypotensive requiring initiation of the massive transfusion protocol.  OG tube was placed with copious amounts of frank blood coming from OG tube.  CT angio obtained and showed large volume active extravasation from the gastroduodenal artery with contrast in the gastric antrum/proximal duodenum extending both retrograde in the gastric body and anterograde into the proximal duodenum.  He was taken for emergent coil embolization of the gastroduodenal artery.  PCCM asked to admit for further workup and treatment.    Please see Significant Hospital Events section below for full detailed hospital course.   Pertinent  Medical History   Past Medical History:  Diagnosis Date   Carpal tunnel syndrome of right wrist    ED (erectile dysfunction)    GERD (gastroesophageal reflux disease)    History of dysplastic nevus 01/03/2021   left posterior ear, moderate atypia   Hypertension    Inguinal hernia, bilateral    Insomnia    Jones fracture    left foot   Rosacea    Sleep apnea    had UPPP surgery   Tobacco use    Ventral hernia    Wears contact lenses     Micro Data:  N/A  Antimicrobials:   Anti-infectives (From admission, onward)    Start     Dose/Rate Route Frequency Ordered Stop   09/10/24 1000  cefTRIAXone  (ROCEPHIN ) 2 g in sodium chloride  0.9 % 100 mL IVPB        2 g 200 mL/hr over 30 Minutes Intravenous Every 24 hours 09/09/24 1108     09/09/24 0830  cefTRIAXone  (ROCEPHIN ) 1  g in sodium chloride  0.9 % 100 mL IVPB        1 g 200 mL/hr over 30 Minutes Intravenous  Once 09/09/24 0817 09/09/24 0902       Significant Hospital Events: Including procedures, antibiotic start and stop dates in addition to other pertinent events   1/30: Presented to ED with acute GI bleed and hemorrhagic shock.  Ultimately required intubation in the ED for airway protection.  GI consulted, required massive transfusion protocol.  Found to have active extravasation from the gastroduodenal  artery on CTA angio, underwent coil embolization.  Admitted to ICU remains critically ill and intubated.  Interim History / Subjective:  As outlined above under Significant Hospital Events section  Objective   Blood pressure 105/72, pulse (!) 141, temperature 97.6 F (36.4 C), temperature source Axillary, resp. rate 18, height 5' 9 (1.753 m), weight 80.4 kg, SpO2 100%.    Vent Mode: PRVC FiO2 (%):  [40 %] 40 % Set Rate:  [18 bmp] 18 bmp Vt Set:  [450 mL] 450 mL PEEP:  [5 cmH20] 5 cmH20   Intake/Output Summary (Last 24 hours) at 09/09/2024 1116 Last data filed at 09/09/2024 0902 Gross per 24 hour  Intake 100 ml  Output --  Net 100 ml   Filed Weights   09/09/24 0811  Weight: 80.4 kg    Examination: General: Critically ill-appearing male, laying in bed, intubated sedated, no acute distress HENT: Atraumatic, normocephalic, neck supple, no JVD Lungs: Coarse breath sounds, even, nonlabored, synchronous with the vent Cardiovascular: Tachycardia, regular rhythm, S1-S2, no murmurs, rubs, gallops Abdomen: Slightly distended, soft, nontender, no guarding or rebound tenderness Extremities: Normal bulk and tone, no deformities, no edema Neuro: Sedated, withdraws to pain, currently not following commands, PERRLA GU: Foley catheter in place  Resolved Hospital Problem list     Assessment & Plan:   #Hemorrhagic Shock PMHx: HTN  -Continuous cardiac monitoring -Maintain MAP >65 -IV fluids -Transfusions as indicated -Vasopressors as needed to maintain MAP goal -Trend lactic acid until normalized -Trend HS Troponin until peaked -Consider Echocardiogram   #Massive Acute Upper GI Bleed, s/p Coil Embolization to Gastroduodenal Artery on 1/30 #Acute Blood Loss Anemia #Thrombocytopenia  #DIC -Monitor for S/Sx of bleeding -Trend CBC -SCD's for VTE Prophylaxis  -Transfuse for Hgb <7 ~ underwent massive transfusion protocol -Transfuse Platelets for Platelet count < 10K; < 50K with  active bleeding; < 100K with Neurosurgical procedures/processes  -Will give FFP, Platelets and Cryoprecipitate  -GI and Vascular Surgery following, appreciate input -NPO -Protonix  BID -Will continue Ceftriaxone  for now given ETOH hx  #Intubated for airway protection and hemodynamic instability -Full vent support, implement lung protective strategies -Plateau pressures less than 30 cm H20 -Wean FiO2 & PEEP as tolerated to maintain O2 sats >92% -Follow intermittent Chest X-ray & ABG as needed -Spontaneous Breathing Trials when respiratory parameters met and mental status permits -Implement VAP Bundle -Prn Bronchodilators  #Acute Kidney Injury #Anion Gap Metabolic Acidosis due to lactic acidosis -Monitor I&O's / urinary output -Follow BMP -Ensure adequate renal perfusion -Avoid nephrotoxic agents as able -Replace electrolytes as indicated ~ Pharmacy following for assistance with electrolyte replacement -IV fluids -Trend lactic  #Hyperglycemia -CBG's q4h; Target range of 140 to 180 -SSI -Follow ICU Hypo/Hyperglycemia protocol  #Sedation needs in setting of mechanical ventilation #Questionable seizure activity  #High risk for development of Alcohol Withdrawal PMHx: ETOH abuse  -Maintain a RASS goal of 0 to -1 -Fentanyl  and Precedex  to maintain RASS goal -Prn Versed  for seizure -Avoid  sedating medications as able -Daily wake up assessment -Loaded with Keppra  followed by 500 mg BID -EEG pending  -Low threshold for Neurology consultation         Pt is critically ill with multiorgan failure. Prognosis is guarded, high risk for further decompensation, cardiac arrest, and death.  Given current critical illness superimposed on multiple chronic co-morbidities and advanced age, overall long term prognosis is poor.  Recommend consideration of DNR/DNI status.  Will consult Palliative Care to assist with GOC discussions.   Best Practice (right click and Reselect all SmartList  Selections daily)   Diet/type: NPO DVT prophylaxis: SCD GI prophylaxis: PPI Lines: Central line, Dialysis Catheter, Arterial Line, and yes and it is still needed Foley:  Yes, and it is still needed Code Status:  DNR Last date of multidisciplinary goals of care discussion [N/A]  1/30: Pt's son and father updated at bedside on plan of care.  See IPAL note for full details.   Labs   CBC: Recent Labs  Lab 09/09/24 0815  WBC 13.3*  NEUTROABS 10.9*  HGB 6.2*  HCT 18.7*  MCV 106.3*  PLT 249    Basic Metabolic Panel: Recent Labs  Lab 09/09/24 0815  NA 128*  K 4.2  CL 97*  CO2 15*  GLUCOSE 164*  BUN 37*  CREATININE 1.07  CALCIUM  8.2*  MG 2.0   GFR: Estimated Creatinine Clearance: 79.8 mL/min (by C-G formula based on SCr of 1.07 mg/dL). Recent Labs  Lab 09/09/24 0815  WBC 13.3*    Liver Function Tests: Recent Labs  Lab 09/09/24 0815  AST 21  ALT 12  ALKPHOS 42  BILITOT 0.2  PROT 5.3*  ALBUMIN 3.1*   Recent Labs  Lab 09/09/24 0815  LIPASE 58*   No results for input(s): AMMONIA in the last 168 hours.  ABG No results found for: PHART, PCO2ART, PO2ART, HCO3, TCO2, ACIDBASEDEF, O2SAT   Coagulation Profile: Recent Labs  Lab 09/09/24 0815  INR 1.1    Cardiac Enzymes: No results for input(s): CKTOTAL, CKMB, CKMBINDEX, TROPONINI in the last 168 hours.  HbA1C: No results found for: HGBA1C  CBG: Recent Labs  Lab 09/09/24 1016  GLUCAP 145*    Review of Systems:   Unable to assess due to intubation/sedation/critical illness    Past Medical History:  He,  has a past medical history of Carpal tunnel syndrome of right wrist, ED (erectile dysfunction), GERD (gastroesophageal reflux disease), History of dysplastic nevus (01/03/2021), Hypertension, Inguinal hernia, bilateral, Insomnia, Jones fracture, Rosacea, Sleep apnea, Tobacco use, Ventral hernia, and Wears contact lenses.   Surgical History:   Past Surgical History:   Procedure Laterality Date   BACK SURGERY     lumbar   BREAST BIOPSY Left 06/22/2018   us  bx BENIGN FIBROFATTY TISSUE WITH DEGENERATIVE CHANGES   HERNIA REPAIR     KNEE SURGERY     NASAL SINUS SURGERY  02/22/1998   UPPP, tonguepexy, septo.  Dr. Juengel. [facility]   ORIF TOE FRACTURE Left 12/20/2015   Procedure: OPEN REDUCTION INTERNAL FIXATION (ORIF) 5TH METATARSAL (TOE) FRACTURE LEFT;  Surgeon: Donnice Cory, DPM;  Location: [facility];  Service: Podiatry;  Laterality: Left;  LMA WITH POPLITEAL   TONSILLECTOMY AND ADENOIDECTOMY     UVULOPALATOPHARYNGOPLASTY     VENTRAL HERNIA REPAIR N/A 05/14/2023   Procedure: HERNIA REPAIR VENTRAL ADULT, open;  Surgeon: Jordis Laneta FALCON, MD;  Location: [facility];  Service: General;  Laterality: N/A;   XI ROBOTIC ASSISTED INGUINAL HERNIA REPAIR WITH MESH  Bilateral 01/06/2020   Procedure: XI ROBOTIC ASSISTED INGUINAL HERNIA REPAIR WITH MESH;  Surgeon: Jordis Laneta FALCON, MD;  Location: [facility];  Service: General;  Laterality: Bilateral;     Social History:   reports that he has been smoking cigarettes. He has a 22.5 pack-year smoking history. He has been exposed to tobacco smoke. He has quit using smokeless tobacco. He reports that he does not currently use alcohol. He reports that he does not use drugs.   Family History:  His family history includes Cancer in his paternal grandfather; Diabetes in his paternal grandfather; Heart disease in his father and paternal grandfather. There is no history of Breast cancer or Colon cancer.   Allergies Allergies[1]   Home Medications  Prior to Admission medications  Medication Sig Start Date End Date Taking? Authorizing Provider  Ascorbic Acid (VITAMIN C) 100 MG tablet Take 100 mg by mouth daily.   Yes [provider]  calcium  carbonate (TUMS - DOSED IN MG ELEMENTAL CALCIUM ) 500 MG chewable tablet Chew 1 tablet by mouth as needed for indigestion or heartburn.   Yes [provider]   Dihydroxyaluminum Sod Carb (ROLAIDS PO) Take 1 tablet by mouth as needed.   Yes [provider]  fexofenadine  (ALLEGRA ) 180 MG tablet TAKE 1 TABLET BY MOUTH EVERY DAY 07/18/24  Yes Johnson, Megan P, DO  lisinopril  (ZESTRIL ) 10 MG tablet TAKE 1 TABLET BY MOUTH EVERY DAY 07/20/24  Yes Johnson, Megan P, DO  montelukast  (SINGULAIR ) 10 MG tablet TAKE 1 TABLET BY MOUTH EVERYDAY AT BEDTIME 07/20/24  Yes Johnson, Megan P, DO  naproxen  (NAPROSYN ) 500 MG tablet TAKE 1 TABLET BY MOUTH 2 TIMES DAILY WITH A MEAL. 07/20/24  Yes Johnson, Megan P, DO  tadalafil  (CIALIS ) 20 MG tablet Take 0.5-1 tablets (10-20 mg total) by mouth every other day as needed for erectile dysfunction. 11/19/23  Yes Johnson, Megan P, DO  traZODone  (DESYREL ) 100 MG tablet Take 1 tablet (100 mg total) by mouth at bedtime as needed. for sleep 11/19/23  Yes Johnson, Megan P, DO  metroNIDAZOLE  (METROGEL ) 1 % gel Apply topically daily. Patient not taking: Reported on 09/09/2024 11/19/23   Vicci Bouchard P, DO  triamcinolone  ointment (KENALOG ) 0.5 % Apply 1 Application topically 2 (two) times daily. Patient not taking: Reported on 09/09/2024 11/19/23   Vicci Bouchard SQUIBB, DO     Critical care time: 25 minutes     Inge Lecher, AGACNP-BC [facility] Prefer epic messenger for cross cover needs If after hours, please call E-link       [1] No Known Allergies  "

## 2024-09-09 NOTE — Progress Notes (Incomplete)
 Pt arrived to ICU with last available unit of PRBCs from blood lab infusing.  PT initial bp was 54/16.  LR bolused and awaiting more units of PRBCs from blood lab and massive transfuser from ED.

## 2024-09-09 NOTE — Progress Notes (Signed)
 Pt transported to Vascular on vent for procedure and back to CCU with no issues.

## 2024-09-09 NOTE — Op Note (Signed)
 [facility]  Percutaneous Study/Intervention Procedural Note     Surgeon(s): Best Boy: none  Pre-operative Diagnosis: 1.  Upper GI bleed 2.  Hemorrhagic shock  Post-operative diagnosis:  Same  Procedure(s) Performed:             1.  Ultrasound guidance for vascular access right femoral artery             2.  Catheter placement into gastroduodenal artery via the celiac approach             3.  Aortogram and selective angiogram of the hepatic artery and the gastroduodenal artery             4.  Coil embolization of the gastroduodenal artery using five 3 mm Ruby coils             5.  StarClose closure device right femoral artery             6.  Placement of a right common femoral venous triple-lumen catheter  Anesthesia: No sedation was given patient on a fentanyl  drip coming from the ICU           EBL: Less than 5 cc secondary to the interventional portion  Fluoro Time: 8.5 minutes  Contrast: 35 cc              Indications:  Patient is a 54 y.o.male with massive upper GI bleeding with resultant hemorrhagic shock. The patient has a CT angiogram study showing massive bleeding from the gastroduodenal artery. The patient is brought in for angiography for further evaluation and potential treatment. Risks and benefits are discussed and informed consent is obtained  Procedure:  The patient was identified and appropriate procedural time out was performed.  The patient was then placed supine on the table and prepped and draped in the usual sterile fashion. Moderate conscious sedation was administered during a face to face encounter with the patient throughout the procedure with my supervision of the RN administering medicines and monitoring the patient's vital signs, pulse oximetry, telemetry and mental status throughout from the start of the procedure until the patient was taken to the recovery room.  Ultrasound was used to evaluate the right common  femoral artery.  It was patent .  A digital ultrasound image was acquired.  A Seldinger needle was used to access the right common femoral artery under direct ultrasound guidance and a permanent image was performed.  A 0.035 J wire was advanced without resistance and a 5Fr sheath was placed.  Pigtail catheter was placed into the aorta and an AP aortogram was performed. This demonstrated the approximate location of the celiac we transitioned to the lateral projection to cannulate the celiac artery. A V S1 catheter was used to selectively cannulate the celiac artery.  This demonstrated the bleeding from the gastroduodenal as well as the anatomy.  A Progreat catheter was then advanced and using the Progreat catheter and wire combination it was negotiated into the gastroduodenal artery.  Magnified imaging of the gastroduodenal lip was then performed showing the obvious bleed in the distal anatomy and the Progreat catheter was negotiated across the bleeding site and into the distal gastroduodenal artery.  I then placed two 3 mm x 5 cm Ruby coils a soft and then a standard.  I then pulled the catheter back and placed a second 3 mm x 5 cm soft.  Then readjusting slightly to more 3 mm coils 1 soft and  1 standard within deployed bringing the coiled segment of the gastroduodenal artery proximal to the bleed.  At this point we now had embolized proximally and distally to the bleeding site.  Hand-injection of contrast from the hepatic artery demonstrated complete cessation of extravasation and I elected to terminate the embolization portion of the case.  The diagnostic catheter was removed. StarClose closure device was deployed in usual fashion with excellent hemostatic result.  The patient was returned to the ICU in critical condition.  Ultrasound was then returned to the sterile field in a sterile sleeve.  Imaging of the right common femoral vein was then performed and was echolucent compressible indicating patency.  Under  direct ultrasound visualization after an image was recorded a micro needle was inserted into the jugular vein followed by microwire followed by the J-wire.  Dilator was passed over the J-wire and the triple-lumen catheter was fed over the J-wire.  All 3 lm aspirated and flushed well and the catheter was secured to the skin of the thigh with a 2-0 silk suture.  Sterile dressing was applied to both sites.     Findings: Massive extravasation was noted from the gastroduodenal artery.  Otherwise the anatomy appeared fairly standard.  Once the Progreat was past the site of extravasation hand-injection through the Progreat catheter confirmed distal placement and coils were then placed beginning distally and continuing until the proximal segment had been embolized.  Triple-lumen catheter was inserted in standard fashion.  Disposition: Patient was taken to the recovery room in stable condition having tolerated the procedure well.  Complications:  None  Cordella Shawl 09/09/2024 3:20 PM   This note was created with Dragon Medical transcription system. Any errors in dictation are purely unintentional.

## 2024-09-09 NOTE — IPAL (Signed)
" °  Interdisciplinary Goals of Care Family Meeting   Date carried out: 09/09/2024  Location of the meeting: Conference room  Member's involved: Nurse Practitioner and Family Member or next of kin  Durable Power of Attorney or acting medical decision maker: Pt's son Jaeveon Ashland (via telephone call) and pt's father Yann Biehn in conference room   Discussion: We discussed goals of care for [employer] .  Discussed current critical illness including hemorrhagic shock given severe acute massive upper GI bleed.  Currently with difficulty keeping up with blood loss, currently requiring 3 vasopressors with blood pressures in the 50s to 60s. Attempted to elicit goals of care. We discussed code status, Full Code versus Do Not Resuscitate, Encouraged patient to consider DNR/DNI status understanding evidenced based poor outcomes in similar hospitalized patients, as the cause of the arrest is likely associated with chronic/terminal disease rather than a reversible acute cardio-pulmonary event.  Patient's father and son elect to change CODE STATUS to DNR, however would like to continue current aggressive measures and allow time for outcomes.   Code status: DNR  Disposition: Continue current acute care  Time spent for the meeting: 15 minutes   Inge Lecher, AGACNP-BC [facility] Prefer epic messenger for cross cover needs If after hours, please call E-link  Inge JONETTA Lecher, NP  09/09/2024, 1:11 PM   "

## 2024-09-09 NOTE — Progress Notes (Signed)
 Patient transported from ED to CT on transport ventilator without complications. Once CT completed patient transported to ICU on transport ventilator and switched to bedside ventilator. ETT remains secure, patent, bilateral breath sounds present, and chest rise/fall. No complications noted at this time.

## 2024-09-09 NOTE — Procedures (Signed)
 Central Venous Catheter Insertion Procedure Note  Edward Mckenzie  [id number]  04-Oct-1970  Date:09/09/24  Upfz:87:75 PM   Provider Performing:Nicolet Griffy [name]   Procedure: Insertion of Non-tunneled Central Venous [phone]) with US  guidance (23062)  Trialysis catheter placed due to need for large volume resuscitation   Indication(s) Medication administration and Difficult access  Consent Unable to obtain consent due to emergent nature of procedure.  Anesthesia Topical only with 1% lidocaine    Timeout Verified patient identification, verified procedure, site/side was marked, verified correct patient position, special equipment/implants available, medications/allergies/relevant history reviewed, required imaging and test results available.  Sterile Technique Maximal sterile technique including full sterile barrier drape, hand hygiene, sterile gown, sterile gloves, mask, hair covering, sterile ultrasound probe cover (if used).  Procedure Description Area of catheter insertion was cleaned with chlorhexidine  and draped in sterile fashion.  With real-time ultrasound guidance a HD catheter was placed into the left femoral vein. Nonpulsatile blood flow and easy flushing noted in all ports.  The catheter was sutured in place and sterile dressing applied.  Complications/Tolerance None; patient tolerated the procedure well. Chest X-ray is ordered to verify placement for internal jugular or subclavian cannulation.   Chest x-ray is not ordered for femoral cannulation.  EBL Minimal  Specimen(s) None    Line inserted to the 30 cm mark BIOPATCH applied to the insertion site.    Edward Mckenzie, AGACNP-BC [facility] Prefer epic messenger for cross cover needs If after hours, please call E-link

## 2024-09-09 NOTE — ED Triage Notes (Signed)
 Pt arrives from home via EMS for GIB that started Wednesday. Pt drinks alcohol daily. Pt reports vomiting bright red blood today. Pt reports it was coffee ground emesis on Wednesday. Pt also endorses dark stools.

## 2024-09-09 NOTE — ED Notes (Signed)
 Patient transported to CT

## 2024-09-09 NOTE — Consult Note (Signed)
 "                                                                      MRN : [id number]  Edward Mckenzie is a 54 y.o. (07-18-1971) male who presents with chief complaint of check circulation.  History of Present Illness:   I am asked to see the patient by Dr. Malka and Dr Jordis.  Patient is a 54 year old gentleman with a massive upper GI bleed in hypotensive shock.  At this point he has received approximately 14 units of blood.  CT angiogram was performed just moments ago and clearly demonstrates a bleed into the distal stomach/duodenum likely from the gastroduodenal artery.  At this point he is on maximal pressor support and receiving massive blood transfusions.  I do not believe he is an open surgical candidate and I believe if we are able to embolize the gastroduodenal that he may have a chance at survival.  I have personally reviewed the CT angiogram and concur massive upper GI bleed is is identified with ongoing active extravasation  Active Medications[1]  Past Medical History:  Diagnosis Date   Carpal tunnel syndrome of right wrist    ED (erectile dysfunction)    GERD (gastroesophageal reflux disease)    History of dysplastic nevus 01/03/2021   left posterior ear, moderate atypia   Hypertension    Inguinal hernia, bilateral    Insomnia    Jones fracture    left foot   Rosacea    Sleep apnea    had UPPP surgery   Tobacco use    Ventral hernia    Wears contact lenses     Past Surgical History:  Procedure Laterality Date   BACK SURGERY     lumbar   BREAST BIOPSY Left 06/22/2018   us  bx BENIGN FIBROFATTY TISSUE WITH DEGENERATIVE CHANGES   HERNIA REPAIR     KNEE SURGERY     NASAL SINUS SURGERY  02/22/1998   UPPP, tonguepexy, septo.  Dr. Juengel. [facility]   ORIF TOE FRACTURE Left 12/20/2015   Procedure: OPEN REDUCTION INTERNAL FIXATION (ORIF) 5TH METATARSAL (TOE) FRACTURE LEFT;  Surgeon: Donnice Cory, DPM;  Location: [facility];  Service: Podiatry;   Laterality: Left;  LMA WITH POPLITEAL   TONSILLECTOMY AND ADENOIDECTOMY     UVULOPALATOPHARYNGOPLASTY     VENTRAL HERNIA REPAIR N/A 05/14/2023   Procedure: HERNIA REPAIR VENTRAL ADULT, open;  Surgeon: Jordis Laneta FALCON, MD;  Location: [facility];  Service: General;  Laterality: N/A;   XI ROBOTIC ASSISTED INGUINAL HERNIA REPAIR WITH MESH Bilateral 01/06/2020   Procedure: XI ROBOTIC ASSISTED INGUINAL HERNIA REPAIR WITH MESH;  Surgeon: Jordis Laneta FALCON, MD;  Location: [facility];  Service: General;  Laterality: Bilateral;    Social History Social History[2]  Family History Family History  Problem Relation Age of Onset   Heart disease Father    Diabetes Paternal Grandfather    Heart disease Paternal Grandfather    Cancer Paternal Grandfather        unknown location   Breast cancer Neg Hx    Colon cancer Neg Hx     Allergies[3]   REVIEW OF SYSTEMS (Negative unless checked)  Constitutional: [] Weight loss  [] Fever  [] Chills Cardiac: [] Chest pain   []   Chest pressure   [] Palpitations   [] Shortness of breath when laying flat   [] Shortness of breath with exertion. Vascular:  [x] Pain in legs with walking   [] Pain in legs at rest  [] History of DVT   [] Phlebitis   [] Swelling in legs   [] Varicose veins   [] Non-healing ulcers Pulmonary:   [] Uses home oxygen   [] Productive cough   [] Hemoptysis   [] Wheeze  [] COPD   [] Asthma Neurologic:  [] Dizziness   [] Seizures   [] History of stroke   [] History of TIA  [] Aphasia   [] Vissual changes   [] Weakness or numbness in arm   [] Weakness or numbness in leg Musculoskeletal:   [] Joint swelling   [] Joint pain   [] Low back pain Hematologic:  [] Easy bruising  [] Easy bleeding   [] Hypercoagulable state   [] Anemic Gastrointestinal:  [] Diarrhea   [] Vomiting  [] Gastroesophageal reflux/heartburn   [] Difficulty swallowing. Genitourinary:  [] Chronic kidney disease   [] Difficult urination  [] Frequent urination   [] Blood in urine Skin:  [] Rashes   [] Ulcers  Psychological:  [] History  of anxiety   []  History of major depression.  Physical Examination  Vitals:   09/09/24 1410 09/09/24 1411 09/09/24 1412 09/09/24 1413  BP: (!) 83/65   (!) 82/27  Pulse:      Resp: 18 17 17 16   Temp: (!) 96.4 F (35.8 C) (!) 96.4 F (35.8 C) (!) 96.3 F (35.7 C) (!) 96.3 F (35.7 C)  TempSrc:      SpO2:      Weight:      Height:       Body mass index is 26.18 kg/m. Gen: WD/WN, profound distress with hemodynamic instability Head: [place]/AT, No temporalis wasting.  Ear/Nose/Throat: Hearing grossly intact, nares w/o erythema or drainage Eyes: PER, EOMI, sclera nonicteric.  Neck: Supple, no masses.  No bruit or JVD.  Pulmonary: Intubated ventilated, no audible wheezing, no use of accessory muscles.  Cardiac: Tachycardic, no Murmurs. Vascular:   Vessel Right Left  Radial Not palpable Not palpable  Gastrointestinal: soft, non-distended. No guarding/no peritoneal signs.  Musculoskeletal: M/S 5/5 throughout.  No visible deformity.  Neurologic: CN 2-12 on assessable pain  Psychiatric: Obtunded  dermatologic: No rashes or ulcers noted.  No changes consistent with cellulitis.   CBC Lab Results  Component Value Date   WBC 13.3 (H) 09/09/2024   HGB 10.0 (L) 09/09/2024   HCT 30.0 (L) 09/09/2024   MCV 106.3 (H) 09/09/2024   PLT 76 (L) 09/09/2024    BMET    Component Value Date/Time   NA 128 (L) 09/09/2024 0815   NA 139 11/19/2023 1119   K 4.2 09/09/2024 0815   CL 97 (L) 09/09/2024 0815   CO2 15 (L) 09/09/2024 0815   GLUCOSE 164 (H) 09/09/2024 0815   BUN 37 (H) 09/09/2024 0815   BUN 9 11/19/2023 1119   CREATININE 1.07 09/09/2024 0815   CALCIUM  8.2 (L) 09/09/2024 0815   GFRNONAA >60 09/09/2024 0815   GFRAA 132 07/10/2020 1042   Estimated Creatinine Clearance: 79.8 mL/min (by C-G formula based on SCr of 1.07 mg/dL).  COAG Lab Results  Component Value Date   INR 1.4 (H) 09/09/2024   INR 1.2 09/09/2024   INR 1.2 09/09/2024    Radiology DG Chest Port 1 View Result  Date: 09/09/2024 EXAM: 1 VIEW(S) XRAY OF THE CHEST 09/09/2024 11:34:47 AM COMPARISON: None available. CLINICAL HISTORY: Encounter for intubation. FINDINGS: LINES, TUBES AND DEVICES: Endotracheal tube in place with tip just below the level of the clavicular  heads. LUNGS AND PLEURA: No focal pulmonary opacity. No pleural effusion. No pneumothorax. HEART AND MEDIASTINUM: No acute abnormality of the cardiac and mediastinal silhouettes. BONES AND SOFT TISSUES: No acute osseous abnormality. IMPRESSION: 1. Endotracheal tube in place with tip just below the level of the clavicular heads. Electronically signed by: Waddell Calk MD 09/09/2024 12:07 PM EST RP Workstation: [id number]   CT ABDOMEN PELVIS W CONTRAST Result Date: 09/09/2024 EXAM: CT ABDOMEN AND PELVIS WITH CONTRAST 09/09/2024 09:52:50 AM TECHNIQUE: CT of the abdomen and pelvis was performed with the administration of 100 mL of iohexol  (OMNIPAQUE ) 300 MG/ML solution. Multiplanar reformatted images are provided for review. Automated exposure control, iterative reconstruction, and/or weight-based adjustment of the mA/kV was utilized to reduce the radiation dose to as low as reasonably achievable. COMPARISON: None available. CLINICAL HISTORY: Abdominal pain, acute, nonlocalized. FINDINGS: LOWER CHEST: No acute abnormality. LIVER: The liver is unremarkable. GALLBLADDER AND BILE DUCTS: Gallbladder is unremarkable. No biliary ductal dilatation. SPLEEN: No acute abnormality. PANCREAS: Dense calcification identified along the anterior aspect of the pancreatic head measuring 0.8 cm, image 33/3. No main duct dilatation, inflammation, or mass identified. Differential diagnosis for pancreatic head calcification includes chronic pancreatitis. Follow-up: Consider further evaluation for chronic pancreatitis. ADRENAL GLANDS: Right adrenal nodule measures 3.3 x 2.2 cm and has 28 Hounsfield units. Left adrenal nodule measures 3.3x2.2 cm and 28 hounsfield units. KIDNEYS, URETERS  AND BLADDER: 8 mm, too small to characterize, Bosniak class 2 cyst arises off the inferior pole of the right kidney. Per consensus, no follow-up is needed for simple Bosniak type 1 and 2 renal cysts, unless the patient has a malignancy history or risk factors. No stones in the kidneys or ureters. No hydronephrosis. No perinephric or periureteral stranding. Urinary bladder appears normal. GI AND BOWEL: The stomach is within normal limits. There is no pathologic dilatation of the large or small bowel loops to indicate a bowel obstruction. No bowel wall thickening or inflammation identified. Scattered nonpathologically dilated air-filled loops of proximal small bowel noted. The appendix is visualized and appears normal. Mild colonic stool burden identified. PERITONEUM AND RETROPERITONEUM: No ascites. No free air. No free fluid or fluid collections. No signs of pneumoperitoneum. VASCULATURE: Aorta is normal in caliber. Aortic atherosclerotic calcification. LYMPH NODES: No lymphadenopathy. No signs of abdominal or pelvic adenopathy. REPRODUCTIVE ORGANS: Prostate gland enlargement. Follow-up: Clinical. BONES AND SOFT TISSUES: No acute osseous abnormality. No focal soft tissue abnormality. IMPRESSION: 1. No acute findings in the abdomen or pelvis. 2. Bilateral adrenal nodules. These probably represent benign adenomas. According to consensus criteria, more definitive characterization with no-emergent adrenal washout CT or MRI is advised. Electronically signed by: Waddell Calk MD 09/09/2024 10:27 AM EST RP Workstation: [id number]     Assessment/Plan Massive upper GI bleed:  I believe that embolization for control of his hemorrhage is his only option for survival.  At this point he is not a open surgical candidate.  I have discussed the grave situation with the family and even in the face of embolization the high risk of death given the situation.  The family wishes that anything be done if it potentially could save  his life and if his asked me to move forward with embolization.  We will continue massive fluid resuscitation and move expeditiously to the angio suite for visceral angiography with the hope of embolization likely of the gastroduodenal artery.   Cordella Shawl, MD  09/09/2024 2:20 PM      [1]  Current Meds  Medication Sig  Ascorbic Acid (VITAMIN C) 100 MG tablet Take 100 mg by mouth daily.   calcium  carbonate (TUMS - DOSED IN MG ELEMENTAL CALCIUM ) 500 MG chewable tablet Chew 1 tablet by mouth as needed for indigestion or heartburn.   Dihydroxyaluminum Sod Carb (ROLAIDS PO) Take 1 tablet by mouth as needed.   fexofenadine  (ALLEGRA ) 180 MG tablet TAKE 1 TABLET BY MOUTH EVERY DAY   lisinopril  (ZESTRIL ) 10 MG tablet TAKE 1 TABLET BY MOUTH EVERY DAY   montelukast  (SINGULAIR ) 10 MG tablet TAKE 1 TABLET BY MOUTH EVERYDAY AT BEDTIME   naproxen  (NAPROSYN ) 500 MG tablet TAKE 1 TABLET BY MOUTH 2 TIMES DAILY WITH A MEAL.   tadalafil  (CIALIS ) 20 MG tablet Take 0.5-1 tablets (10-20 mg total) by mouth every other day as needed for erectile dysfunction.   traZODone  (DESYREL ) 100 MG tablet Take 1 tablet (100 mg total) by mouth at bedtime as needed. for sleep  [2]  Social History Tobacco Use   Smoking status: Every Day    Current packs/day: 1.50    Average packs/day: 1.5 packs/day for 15.0 years (22.5 ttl pk-yrs)    Types: Cigarettes    Passive exposure: Past   Smokeless tobacco: Former  Building Services Engineer status: Never Used  Substance Use Topics   Alcohol use: Not Currently    Alcohol/week: 0.0 standard drinks of alcohol   Drug use: No  [3] No Known Allergies  "

## 2024-09-09 NOTE — Consult Note (Signed)
 "      Rogelia Copping, MD [facility]  [address] [place], [place] [address] Phone: [phone] Fax : [phone]  Consultation  Referring Provider:     Dr. Willo Primary Care Physician:  Vicci Duwaine SQUIBB, DO Primary Gastroenterologist:  [place]         Reason for Consultation:     GI bleed  Date of Admission:  09/09/2024 Date of Consultation:  09/09/2024         HPI:   Edward Mckenzie is a 54 y.o. male who presented to the emergency department with a history of GERD and hypertension and reported GI bleed.  The patient had reported having black stools over the last 36 hours.  The patient is unable to give any history at this time since he is intubated and sedated and the history is gotten through the chart.  The patient was reported to have vomiting blood and having dark stools over the last 2 days.  He eventually decided to call EMS and has had a history of alcohol abuse.  The patient states that he drinks 12-15 beers daily.  There is no report of the patient being on any blood thinners.  After being admitted to the ICU the patient started to drop his blood pressure and was intubated and sedated. Upon arriving in the ICU to evaluate the patient his blood pressures were systolics between 40s and 50s and diastolics in the 20s and 30s.  The patient has already received 4 units of blood and 2 more units of blood have been ordered.  The patient has also been put on 30 mics of Levophed  and is significantly tachycardic at 130. The patient's CMP showed him to have a increased BUN to creatinine ratio consistent with an upper GI bleed and normal liver enzymes.  The patient's hemoglobin on admission was 6.2.  The patient's platelets were noted to be 249. He had reported to the ED staff that he takes Tylenol  PM but does not take any other NSAIDs and has not been told he has any history of liver disease.  Past Medical History:  Diagnosis Date   Carpal tunnel syndrome of right wrist    ED  (erectile dysfunction)    GERD (gastroesophageal reflux disease)    History of dysplastic nevus 01/03/2021   left posterior ear, moderate atypia   Hypertension    Inguinal hernia, bilateral    Insomnia    Jones fracture    left foot   Rosacea    Sleep apnea    had UPPP surgery   Tobacco use    Ventral hernia    Wears contact lenses     Past Surgical History:  Procedure Laterality Date   BACK SURGERY     lumbar   BREAST BIOPSY Left 06/22/2018   us  bx BENIGN FIBROFATTY TISSUE WITH DEGENERATIVE CHANGES   HERNIA REPAIR     KNEE SURGERY     NASAL SINUS SURGERY  02/22/1998   UPPP, tonguepexy, septo.  Dr. Juengel. [facility]   ORIF TOE FRACTURE Left 12/20/2015   Procedure: OPEN REDUCTION INTERNAL FIXATION (ORIF) 5TH METATARSAL (TOE) FRACTURE LEFT;  Surgeon: Donnice Cory, DPM;  Location[facility];  Service: Podiatry;  Laterality: Left;  LMA WITH POPLITEAL   TONSILLECTOMY AND ADENOIDECTOMY     UVULOPALATOPHARYNGOPLASTY     VENTRAL HERNIA REPAIR N/A 05/14/2023   Procedure: HERNIA REPAIR VENTRAL ADULT, open;  Surgeon: Jordis Laneta FALCON, MD;  Location: [facility];  Service: General;  Laterality:  N/A;   XI ROBOTIC ASSISTED INGUINAL HERNIA REPAIR WITH MESH Bilateral 01/06/2020   Procedure: XI ROBOTIC ASSISTED INGUINAL HERNIA REPAIR WITH MESH;  Surgeon: Jordis Laneta FALCON, MD;  Location: [facility];  Service: General;  Laterality: Bilateral;    Prior to Admission medications  Medication Sig Start Date End Date Taking? Authorizing Provider  Ascorbic Acid (VITAMIN C) 100 MG tablet Take 100 mg by mouth daily.   Yes [provider]  calcium  carbonate (TUMS - DOSED IN MG ELEMENTAL CALCIUM ) 500 MG chewable tablet Chew 1 tablet by mouth as needed for indigestion or heartburn.   Yes [provider]  Dihydroxyaluminum Sod Carb (ROLAIDS PO) Take 1 tablet by mouth as needed.   Yes [provider]  fexofenadine  (ALLEGRA ) 180 MG tablet TAKE 1 TABLET BY MOUTH EVERY DAY 07/18/24   Yes Johnson, Megan P, DO  lisinopril  (ZESTRIL ) 10 MG tablet TAKE 1 TABLET BY MOUTH EVERY DAY 07/20/24  Yes Johnson, Megan P, DO  montelukast  (SINGULAIR ) 10 MG tablet TAKE 1 TABLET BY MOUTH EVERYDAY AT BEDTIME 07/20/24  Yes Johnson, Megan P, DO  naproxen  (NAPROSYN ) 500 MG tablet TAKE 1 TABLET BY MOUTH 2 TIMES DAILY WITH A MEAL. 07/20/24  Yes Johnson, Megan P, DO  tadalafil  (CIALIS ) 20 MG tablet Take 0.5-1 tablets (10-20 mg total) by mouth every other day as needed for erectile dysfunction. 11/19/23  Yes Johnson, Megan P, DO  traZODone  (DESYREL ) 100 MG tablet Take 1 tablet (100 mg total) by mouth at bedtime as needed. for sleep 11/19/23  Yes Johnson, Megan P, DO  metroNIDAZOLE  (METROGEL ) 1 % gel Apply topically daily. Patient not taking: Reported on 09/09/2024 11/19/23   Vicci Bouchard P, DO  triamcinolone  ointment (KENALOG ) 0.5 % Apply 1 Application topically 2 (two) times daily. Patient not taking: Reported on 09/09/2024 11/19/23   Vicci Bouchard SQUIBB, DO    Family History  Problem Relation Age of Onset   Heart disease Father    Diabetes Paternal Grandfather    Heart disease Paternal Grandfather    Cancer Paternal Grandfather        unknown location   Breast cancer Neg Hx    Colon cancer Neg Hx      Social History[1]  Allergies as of 09/09/2024   (No Known Allergies)    Review of Systems:    All systems reviewed and negative except where noted in HPI.   Physical Exam:  Vital signs in last 24 hours: Temp:  [97.6 F (36.4 C)] 97.6 F (36.4 C) (01/30 0813) Pulse Rate:  [97-184] 141 (01/30 1100) Resp:  [14-18] 18 (01/30 1100) BP: (95-106)/(59-72) 105/72 (01/30 1100) SpO2:  [99 %-100 %] 100 % (01/30 1100) FiO2 (%):  [40 %] 40 % (01/30 1035) Weight:  [80.4 kg] 80.4 kg (01/30 0811)   General:   Critically ill-appearing Head:  Normocephalic and atraumatic. Eyes:   No icterus.   Conjunctiva pink.  Neck:  Supple; no masses or thyroidomegaly Lungs: Lungs clear to auscultation bilaterally.    No wheezes, crackles, or rhonchi.  Heart: Tachycardic;  Without murmur, clicks, rubs or gallops Abdomen:  Soft, nondistended, nontender. Normal bowel sounds. No appreciable masses or hepatomegaly.  No rebound or guarding.  Rectal:  Not performed. Msk:  Symmetrical without gross deformities.  Extremities:  Without edema, cyanosis or clubbing. Neurologic: Unable to be assessed. Skin:  Intact without significant lesions or rashes. Cervical Nodes:  No significant cervical adenopathy. Psych: Intubated and sedated.  LAB RESULTS: Recent Labs  09/09/24 0815  WBC 13.3*  HGB 6.2*  HCT 18.7*  PLT 249   BMET Recent Labs    09/09/24 0815  NA 128*  K 4.2  CL 97*  CO2 15*  GLUCOSE 164*  BUN 37*  CREATININE 1.07  CALCIUM  8.2*   LFT Recent Labs    09/09/24 0815  PROT 5.3*  ALBUMIN 3.1*  AST 21  ALT 12  ALKPHOS 42  BILITOT 0.2   PT/INR Recent Labs    09/09/24 0815  LABPROT 14.3  INR 1.1    STUDIES: CT ABDOMEN PELVIS W CONTRAST Result Date: 09/09/2024 EXAM: CT ABDOMEN AND PELVIS WITH CONTRAST 09/09/2024 09:52:50 AM TECHNIQUE: CT of the abdomen and pelvis was performed with the administration of 100 mL of iohexol  (OMNIPAQUE ) 300 MG/ML solution. Multiplanar reformatted images are provided for review. Automated exposure control, iterative reconstruction, and/or weight-based adjustment of the mA/kV was utilized to reduce the radiation dose to as low as reasonably achievable. COMPARISON: None available. CLINICAL HISTORY: Abdominal pain, acute, nonlocalized. FINDINGS: LOWER CHEST: No acute abnormality. LIVER: The liver is unremarkable. GALLBLADDER AND BILE DUCTS: Gallbladder is unremarkable. No biliary ductal dilatation. SPLEEN: No acute abnormality. PANCREAS: Dense calcification identified along the anterior aspect of the pancreatic head measuring 0.8 cm, image 33/3. No main duct dilatation, inflammation, or mass identified. Differential diagnosis for pancreatic head calcification  includes chronic pancreatitis. Follow-up: Consider further evaluation for chronic pancreatitis. ADRENAL GLANDS: Right adrenal nodule measures 3.3 x 2.2 cm and has 28 Hounsfield units. Left adrenal nodule measures 3.3x2.2 cm and 28 hounsfield units. KIDNEYS, URETERS AND BLADDER: 8 mm, too small to characterize, Bosniak class 2 cyst arises off the inferior pole of the right kidney. Per consensus, no follow-up is needed for simple Bosniak type 1 and 2 renal cysts, unless the patient has a malignancy history or risk factors. No stones in the kidneys or ureters. No hydronephrosis. No perinephric or periureteral stranding. Urinary bladder appears normal. GI AND BOWEL: The stomach is within normal limits. There is no pathologic dilatation of the large or small bowel loops to indicate a bowel obstruction. No bowel wall thickening or inflammation identified. Scattered nonpathologically dilated air-filled loops of proximal small bowel noted. The appendix is visualized and appears normal. Mild colonic stool burden identified. PERITONEUM AND RETROPERITONEUM: No ascites. No free air. No free fluid or fluid collections. No signs of pneumoperitoneum. VASCULATURE: Aorta is normal in caliber. Aortic atherosclerotic calcification. LYMPH NODES: No lymphadenopathy. No signs of abdominal or pelvic adenopathy. REPRODUCTIVE ORGANS: Prostate gland enlargement. Follow-up: Clinical. BONES AND SOFT TISSUES: No acute osseous abnormality. No focal soft tissue abnormality. IMPRESSION: 1. No acute findings in the abdomen or pelvis. 2. Bilateral adrenal nodules. These probably represent benign adenomas. According to consensus criteria, more definitive characterization with no-emergent adrenal washout CT or MRI is advised. Electronically signed by: Waddell Calk MD 09/09/2024 10:27 AM EST RP Workstation: [id number]      Impression / Plan:   Assessment: Principal Problem:   Acute GI bleeding   KRYSTLE OBERMAN is a 54 y.o. y/o male  with admission with acute GI bleed.  The patient has a history of significant alcohol abuse and has been started on octreotide .  The patient has been transfused multiple units of blood and is also receiving plasma.  The patient is on pressors at the present time and has also received epinephrine . The CT scan did not show any signs of portal hypertension or esophageal varices.  Plan:  This patient has had upper GI  bleed as a likely source of his presenting symptoms.  The patient is being resuscitated at the present time.  The patient will need to be stabilized prior to any endoluminal visualization.  UPTODATE: In patients with ongoing hemodynamic instability or concern for severe, active bleeding, other diagnostic tests include CT angiography (CTA using an overt GI bleeding protocol) and conventional angiography, both of which can detect active bleeding. When an arterial source of bleeding is localized, angiography with embolization can be performed to control bleeding.   Consider vascular surgery consult.   Thank you for involving me in the care of this patient.      LOS: 0 days   Rogelia Copping, MD, MD. NOLIA 09/09/2024, 11:33 AM,  Pager [phone] 7am-5pm  Check AMION for 5pm -7am coverage and on weekends   Note: This dictation was prepared with Dragon dictation along with smaller phrase technology. Any transcriptional errors that result from this process are unintentional.       [1]  Social History Tobacco Use   Smoking status: Every Day    Current packs/day: 1.50    Average packs/day: 1.5 packs/day for 15.0 years (22.5 ttl pk-yrs)    Types: Cigarettes    Passive exposure: Past   Smokeless tobacco: Former  Building Services Engineer status: Never Used  Substance Use Topics   Alcohol use: Not Currently    Alcohol/week: 0.0 standard drinks of alcohol   Drug use: No   "

## 2024-09-09 NOTE — ED Notes (Signed)
Pt vomited a large amount of blood

## 2024-09-10 DIAGNOSIS — K922 Gastrointestinal hemorrhage, unspecified: Secondary | ICD-10-CM | POA: Diagnosis not present

## 2024-09-10 DIAGNOSIS — R571 Hypovolemic shock: Secondary | ICD-10-CM | POA: Diagnosis not present

## 2024-09-10 DIAGNOSIS — E8721 Acute metabolic acidosis: Secondary | ICD-10-CM | POA: Diagnosis not present

## 2024-09-10 DIAGNOSIS — J9601 Acute respiratory failure with hypoxia: Secondary | ICD-10-CM

## 2024-09-10 DIAGNOSIS — D62 Acute posthemorrhagic anemia: Secondary | ICD-10-CM | POA: Diagnosis not present

## 2024-09-10 LAB — BPAM RBC
Blood Product Expiration Date: 202602272359
Blood Product Expiration Date: 202602272359
Blood Product Expiration Date: 202602272359
Blood Product Expiration Date: 202602272359
Blood Product Expiration Date: 202602272359
Blood Product Expiration Date: 202602282359
Blood Product Expiration Date: 202602282359
Blood Product Expiration Date: 202602282359
Blood Product Expiration Date: 202602282359
Blood Product Expiration Date: 202602282359
Blood Product Expiration Date: 202602282359
Blood Product Expiration Date: 202602282359
Blood Product Expiration Date: 202602282359
Blood Product Expiration Date: 202602282359
Blood Product Expiration Date: 202602282359
Blood Product Expiration Date: 202602282359
Blood Product Expiration Date: 202602282359
Blood Product Expiration Date: 202602282359
Blood Product Expiration Date: 202603062359
Blood Product Expiration Date: 202603062359
Blood Product Expiration Date: 202603062359
Blood Product Expiration Date: 202603062359
Blood Product Expiration Date: 202603062359
Blood Product Expiration Date: 202603062359
Blood Product Expiration Date: 202603062359
Blood Product Expiration Date: 202603062359
Blood Product Expiration Date: 202603062359
Blood Product Expiration Date: 202603072359
Blood Product Expiration Date: 202603072359
Blood Product Expiration Date: 202603092359
ISSUE DATE / TIME: 202601301008
ISSUE DATE / TIME: 202601301008
ISSUE DATE / TIME: 202601301035
ISSUE DATE / TIME: 202601301035
ISSUE DATE / TIME: 202601301123
ISSUE DATE / TIME: 202601301123
ISSUE DATE / TIME: 202601301144
ISSUE DATE / TIME: 202601301144
ISSUE DATE / TIME: 202601301144
ISSUE DATE / TIME: 202601301144
ISSUE DATE / TIME: 202601301306
ISSUE DATE / TIME: 202601301306
ISSUE DATE / TIME: 202601301306
ISSUE DATE / TIME: 202601301306
ISSUE DATE / TIME: 202601301410
ISSUE DATE / TIME: 202601301410
ISSUE DATE / TIME: 202601301410
ISSUE DATE / TIME: 202601301410
ISSUE DATE / TIME: 202601301444
ISSUE DATE / TIME: 202601301444
ISSUE DATE / TIME: 202601301444
ISSUE DATE / TIME: 202601301444
Unit Type and Rh: 5100
Unit Type and Rh: 5100
Unit Type and Rh: 5100
Unit Type and Rh: 5100
Unit Type and Rh: 5100
Unit Type and Rh: 5100
Unit Type and Rh: 5100
Unit Type and Rh: 5100
Unit Type and Rh: 5100
Unit Type and Rh: 5100
Unit Type and Rh: 5100
Unit Type and Rh: 5100
Unit Type and Rh: 5100
Unit Type and Rh: 5100
Unit Type and Rh: 5100
Unit Type and Rh: 5100
Unit Type and Rh: 5100
Unit Type and Rh: 5100
Unit Type and Rh: 5100
Unit Type and Rh: 5100
Unit Type and Rh: 5100
Unit Type and Rh: 5100
Unit Type and Rh: 5100
Unit Type and Rh: 5100
Unit Type and Rh: 5100
Unit Type and Rh: 5100
Unit Type and Rh: 6200
Unit Type and Rh: 6200
Unit Type and Rh: 6200
Unit Type and Rh: 6200

## 2024-09-10 LAB — TYPE AND SCREEN
ABO/RH(D): A POS
Antibody Screen: NEGATIVE
Unit division: 0
Unit division: 0
Unit division: 0
Unit division: 0
Unit division: 0
Unit division: 0
Unit division: 0
Unit division: 0
Unit division: 0
Unit division: 0
Unit division: 0
Unit division: 0
Unit division: 0
Unit division: 0
Unit division: 0
Unit division: 0
Unit division: 0
Unit division: 0
Unit division: 0
Unit division: 0
Unit division: 0
Unit division: 0
Unit division: 0
Unit division: 0
Unit division: 0
Unit division: 0
Unit division: 0
Unit division: 0
Unit division: 0
Unit division: 0

## 2024-09-10 LAB — PREPARE CRYOPRECIPITATE: Unit division: 0

## 2024-09-10 LAB — CBC
HCT: 32.9 % — ABNORMAL LOW (ref 39.0–52.0)
HCT: 31.4 % — ABNORMAL LOW (ref 39.0–52.0)
HCT: 24.7 % — ABNORMAL LOW (ref 39.0–52.0)
Hemoglobin: 11.8 g/dL — ABNORMAL LOW (ref 13.0–17.0)
Hemoglobin: 11.4 g/dL — ABNORMAL LOW (ref 13.0–17.0)
Hemoglobin: 8.7 g/dL — ABNORMAL LOW (ref 13.0–17.0)
MCH: 30.1 pg (ref 26.0–34.0)
MCH: 30 pg (ref 26.0–34.0)
MCH: 30.3 pg (ref 26.0–34.0)
MCHC: 35.9 g/dL (ref 30.0–36.0)
MCHC: 36.3 g/dL — ABNORMAL HIGH (ref 30.0–36.0)
MCHC: 35.2 g/dL (ref 30.0–36.0)
MCV: 83.9 fL (ref 80.0–100.0)
MCV: 82.6 fL (ref 80.0–100.0)
MCV: 86.1 fL (ref 80.0–100.0)
Platelets: 59 10*3/uL — ABNORMAL LOW (ref 150–400)
Platelets: 68 10*3/uL — ABNORMAL LOW (ref 150–400)
Platelets: 72 10*3/uL — ABNORMAL LOW (ref 150–400)
RBC: 3.92 MIL/uL — ABNORMAL LOW (ref 4.22–5.81)
RBC: 3.8 MIL/uL — ABNORMAL LOW (ref 4.22–5.81)
RBC: 2.87 MIL/uL — ABNORMAL LOW (ref 4.22–5.81)
RDW: 14.6 % (ref 11.5–15.5)
RDW: 14.6 % (ref 11.5–15.5)
RDW: 15 % (ref 11.5–15.5)
WBC: 7.7 10*3/uL (ref 4.0–10.5)
WBC: 8.7 10*3/uL (ref 4.0–10.5)
WBC: 8.3 10*3/uL (ref 4.0–10.5)
nRBC: 0 % (ref 0.0–0.2)
nRBC: 0 % (ref 0.0–0.2)
nRBC: 0 % (ref 0.0–0.2)

## 2024-09-10 LAB — BPAM PLATELET PHERESIS
Blood Product Expiration Date: 202602022359
ISSUE DATE / TIME: 202601301757
Unit Type and Rh: 7300

## 2024-09-10 LAB — GLUCOSE, CAPILLARY
Glucose-Capillary: 107 mg/dL — ABNORMAL HIGH (ref 70–99)
Glucose-Capillary: 112 mg/dL — ABNORMAL HIGH (ref 70–99)
Glucose-Capillary: 130 mg/dL — ABNORMAL HIGH (ref 70–99)
Glucose-Capillary: 147 mg/dL — ABNORMAL HIGH (ref 70–99)
Glucose-Capillary: 86 mg/dL (ref 70–99)
Glucose-Capillary: 86 mg/dL (ref 70–99)
Glucose-Capillary: 87 mg/dL (ref 70–99)

## 2024-09-10 LAB — BPAM FFP
Blood Product Expiration Date: 202602042359
Unit Type and Rh: 6200

## 2024-09-10 LAB — HEPATIC FUNCTION PANEL
ALT: 28 U/L (ref 0–44)
AST: 46 U/L — ABNORMAL HIGH (ref 15–41)
Albumin: 2.4 g/dL — ABNORMAL LOW (ref 3.5–5.0)
Alkaline Phosphatase: 35 U/L — ABNORMAL LOW (ref 38–126)
Bilirubin, Direct: 0.1 mg/dL (ref 0.0–0.2)
Total Bilirubin: <0.2 mg/dL (ref 0.0–1.2)
Total Protein: 3.8 g/dL — ABNORMAL LOW (ref 6.5–8.1)

## 2024-09-10 LAB — RENAL FUNCTION PANEL
Albumin: 2.3 g/dL — ABNORMAL LOW (ref 3.5–5.0)
Anion gap: 8 (ref 5–15)
BUN: 48 mg/dL — ABNORMAL HIGH (ref 6–20)
CO2: 29 mmol/L (ref 22–32)
Calcium: 8 mg/dL — ABNORMAL LOW (ref 8.9–10.3)
Chloride: 101 mmol/L (ref 98–111)
Creatinine, Ser: 1.3 mg/dL — ABNORMAL HIGH (ref 0.61–1.24)
GFR, Estimated: >60 mL/min
Glucose, Bld: 122 mg/dL — ABNORMAL HIGH (ref 70–99)
Phosphorus: 7 mg/dL — ABNORMAL HIGH (ref 2.5–4.6)
Potassium: 5.4 mmol/L — ABNORMAL HIGH (ref 3.5–5.1)
Sodium: 137 mmol/L (ref 135–145)

## 2024-09-10 LAB — PREPARE FRESH FROZEN PLASMA

## 2024-09-10 LAB — HIV ANTIBODY (ROUTINE TESTING W REFLEX): HIV Screen 4th Generation wRfx: NONREACTIVE

## 2024-09-10 LAB — CBC WITH DIFFERENTIAL/PLATELET
Abs Immature Granulocytes: 0.03 10*3/uL (ref 0.00–0.07)
Basophils Absolute: 0 10*3/uL (ref 0.0–0.1)
Basophils Relative: 0 %
Eosinophils Absolute: 0 10*3/uL (ref 0.0–0.5)
Eosinophils Relative: 0 %
HCT: 22.2 % — ABNORMAL LOW (ref 39.0–52.0)
Hemoglobin: 7.7 g/dL — ABNORMAL LOW (ref 13.0–17.0)
Immature Granulocytes: 0 %
Lymphocytes Relative: 22 %
Lymphs Abs: 2 10*3/uL (ref 0.7–4.0)
MCH: 29.8 pg (ref 26.0–34.0)
MCHC: 34.7 g/dL (ref 30.0–36.0)
MCV: 86 fL (ref 80.0–100.0)
Monocytes Absolute: 0.9 10*3/uL (ref 0.1–1.0)
Monocytes Relative: 11 %
Neutro Abs: 5.9 10*3/uL (ref 1.7–7.7)
Neutrophils Relative %: 67 %
Platelets: 70 10*3/uL — ABNORMAL LOW (ref 150–400)
RBC: 2.58 MIL/uL — ABNORMAL LOW (ref 4.22–5.81)
RDW: 15 % (ref 11.5–15.5)
WBC: 8.9 10*3/uL (ref 4.0–10.5)
nRBC: 0 % (ref 0.0–0.2)

## 2024-09-10 LAB — BASIC METABOLIC PANEL WITH GFR
Anion gap: 5 (ref 5–15)
BUN: 37 mg/dL — ABNORMAL HIGH (ref 6–20)
CO2: 29 mmol/L (ref 22–32)
Calcium: 7.5 mg/dL — ABNORMAL LOW (ref 8.9–10.3)
Chloride: 105 mmol/L (ref 98–111)
Creatinine, Ser: 0.91 mg/dL (ref 0.61–1.24)
GFR, Estimated: >60 mL/min
Glucose, Bld: 100 mg/dL — ABNORMAL HIGH (ref 70–99)
Potassium: 4.1 mmol/L (ref 3.5–5.1)
Sodium: 139 mmol/L (ref 135–145)

## 2024-09-10 LAB — TRIGLYCERIDES: Triglycerides: 157 mg/dL — ABNORMAL HIGH

## 2024-09-10 LAB — PREPARE PLATELET PHERESIS: Unit division: 0

## 2024-09-10 LAB — LACTIC ACID, PLASMA: Lactic Acid, Venous: 1 mmol/L (ref 0.5–1.9)

## 2024-09-10 LAB — BPAM CRYOPRECIPITATE
Blood Product Expiration Date: 202601302246
ISSUE DATE / TIME: 202601301817
Unit Type and Rh: 6200

## 2024-09-10 LAB — FIBRINOGEN: Fibrinogen: 242 mg/dL (ref 210–475)

## 2024-09-10 LAB — HEMOGLOBIN A1C
Hgb A1c MFr Bld: 5.4 % (ref 4.8–5.6)
Mean Plasma Glucose: 108.28 mg/dL

## 2024-09-10 LAB — PROTIME-INR
INR: 1.2 (ref 0.8–1.2)
Prothrombin Time: 15.9 s — ABNORMAL HIGH (ref 11.4–15.2)

## 2024-09-10 LAB — BLOOD GAS, ARTERIAL: Mechanical Rate: 18

## 2024-09-10 LAB — MAGNESIUM: Magnesium: 1.7 mg/dL (ref 1.7–2.4)

## 2024-09-10 MED ORDER — MAGNESIUM SULFATE 2 GM/50ML IV SOLN
2.0000 g | Freq: Once | INTRAVENOUS | Status: AC
  Administered 2024-09-10: 2 g via INTRAVENOUS
  Filled 2024-09-10: qty 50

## 2024-09-10 MED ORDER — LORAZEPAM 1 MG PO TABS: 1.0000 mg | ORAL_TABLET | ORAL | Status: AC | PRN

## 2024-09-10 MED ORDER — THIAMINE HCL 100 MG/ML IJ SOLN
100.0000 mg | Freq: Every day | INTRAMUSCULAR | Status: DC
  Filled 2024-09-10: qty 2

## 2024-09-10 MED ORDER — ORAL CARE MOUTH RINSE
15.0000 mL | OROMUCOSAL | Status: DC
  Administered 2024-09-10 – 2024-09-11 (×4): 15 mL via OROMUCOSAL

## 2024-09-10 MED ORDER — INSULIN GLARGINE-YFGN 100 UNIT/ML ~~LOC~~ SOLN
10.0000 [IU] | Freq: Every day | SUBCUTANEOUS | Status: DC
  Filled 2024-09-10: qty 0.1

## 2024-09-10 MED ORDER — INSULIN ASPART 100 UNIT/ML IJ SOLN
0.0000 [IU] | INTRAMUSCULAR | Status: DC
  Administered 2024-09-10 (×2): 2 [IU] via SUBCUTANEOUS
  Administered 2024-09-12 (×2): 3 [IU] via SUBCUTANEOUS
  Administered 2024-09-13 – 2024-09-14 (×5): 2 [IU] via SUBCUTANEOUS
  Filled 2024-09-10 (×5): qty 2
  Filled 2024-09-10: qty 3
  Filled 2024-09-10 (×2): qty 2
  Filled 2024-09-10: qty 3

## 2024-09-10 MED ORDER — THIAMINE MONONITRATE 100 MG PO TABS
100.0000 mg | ORAL_TABLET | Freq: Every day | ORAL | Status: DC
  Administered 2024-09-10 – 2024-09-16 (×7): 100 mg via ORAL
  Filled 2024-09-10 (×7): qty 1

## 2024-09-10 MED ORDER — FOLIC ACID 1 MG PO TABS
1.0000 mg | ORAL_TABLET | Freq: Every day | ORAL | Status: DC
  Administered 2024-09-10 – 2024-09-16 (×7): 1 mg via ORAL
  Filled 2024-09-10 (×7): qty 1

## 2024-09-10 MED ORDER — SODIUM ZIRCONIUM CYCLOSILICATE 5 G PO PACK
10.0000 g | PACK | Freq: Once | ORAL | Status: AC
  Administered 2024-09-10: 10 g
  Filled 2024-09-10: qty 2

## 2024-09-10 MED ORDER — ADULT MULTIVITAMIN W/MINERALS CH
1.0000 | ORAL_TABLET | Freq: Every day | ORAL | Status: DC
  Administered 2024-09-11 – 2024-09-16 (×6): 1 via ORAL
  Filled 2024-09-10 (×6): qty 1

## 2024-09-10 MED ORDER — ORAL CARE MOUTH RINSE: 15.0000 mL | OROMUCOSAL | Status: DC | PRN

## 2024-09-10 MED ORDER — LORAZEPAM 2 MG/ML IJ SOLN
1.0000 mg | INTRAMUSCULAR | Status: AC | PRN
  Administered 2024-09-10 – 2024-09-11 (×2): 2 mg via INTRAVENOUS
  Filled 2024-09-10 (×2): qty 1

## 2024-09-10 NOTE — Progress Notes (Signed)
 09/10/2024 Elink No s/s of active bleeding, HR Okay, BUN coming down H/H is drifting but hopefully just re-equilibrating Check CBC/fibrinogen/coags 3am Reach out if any additional issues.  Rolan Sharps MD PCCM

## 2024-09-10 NOTE — Discharge Instructions (Signed)
??   [facility] [place] [facility] Center[facility] Type: Inpatient rehab & detox (alcohol & drugs) ?? [address], [place], [place] [address] ?? [phone] A facility offering inpatient rehabilitation and detox services for alcohol and substance use disorders, including opioid and stimulant dependencies. Call to ask about program lengths and insurance/Medicaid acceptance. [facility] Address: [address] [place], [place] [address] ?? [phone]  Non-profit treatment center providing detox, short-term stabilization, and residential programs that help with addiction and mental health. Call to confirm current services and entry criteria.   ?? [facility]iction treatment centerOpen Type: Inpatient detoxification & residential rehab ?? [address] [place], [place] [address] ?? [phone] One of the larger and longest-established treatment providers in the area, offering detox, residential treatment, partial hospitalization, and supportive programming for adults.  [facility] ?? [address], [place], [place] [address] ?? [phone] Offers addiction treatment services including detox and longer-term recovery programs. Call to confirm specific inpatient care levels and insurance/Medicaid options. [facility] ?? [address] [place], [place] [address] ?? [phone]  Residential treatment facility providing high-intensity residential care for adults (18+) with substance use disorders; referrals accepted and intake interviews typically available weekdays.   ?? Tips Before Contacting Centers ? Ask about: Insurance/Medicaid acceptance and sliding scale fees Bed availability & waitlist process Program length (e.g., 30 days, 60 days,  longer) Detox services vs residential vs outpatient levels ? If immediate medically supervised detox is needed: You can also contact local hospitals or emergency departments for detox referrals and transfer arrangements. ? Crisis support: If someone is in crisis related to substance use or mental health right now, call 51 -- the Suicide & Crisis Lifeline available 24/7.

## 2024-09-10 NOTE — Progress Notes (Signed)
 PHARMACY CONSULT NOTE - FOLLOW UP  Pharmacy Consult for Electrolyte Monitoring and Replacement   Recent Labs: Potassium (mmol/L)  Date Value  09/10/2024 5.4 (H)   Magnesium  (mg/dL)  Date Value  98/68/7973 1.7   Calcium  (mg/dL)  Date Value  98/68/7973 8.0 (L)   Albumin (g/dL)  Date Value  98/68/7973 2.3 (L)  09/10/2024 2.4 (L)  11/19/2023 4.5   Phosphorus (mg/dL)  Date Value  98/68/7973 7.0 (H)   Sodium (mmol/L)  Date Value  09/10/2024 137  11/19/2023 139     Assessment: 54 y.o. male with past medical history of hypertension and GERD who presents to the ED complaining of GI bleeding. Pharmacy is asked to follow and replace electrolytes while in CCU  Goal of Therapy:  Electrolytes WNL  Plan: [place] a& mag --2 grams IV magnesium  sulfate x 1 --sodium zirconium cyclosilicate  10 grams per tube x 1 --recheck electrolytes at 1600 and again in am  Adriana JONETTA Bolster ,PharmD Clinical Pharmacist 09/10/2024 7:26 AM

## 2024-09-10 NOTE — Progress Notes (Signed)
 "  NAME:  Edward Mckenzie, MRN:  [id number], DOB:  09-25-1970, LOS: 1 ADMISSION DATE:  09/09/2024 History of Present Illness:  Edward Mckenzie is a 54 y.o. male with past medical history of hypertension and GERD who presents to the ED complaining of GI bleeding.  Patient reports that over the past 36 hours he has been having regular dark black bowel movements as well as multiple episodes of vomiting blood.  He states that the vomiting blood had been more severe this morning and so he eventually decided to call EMS.  He denies any history of similar symptoms, denies any history of liver disease but does drink 12-15 beers daily.  He does not take a blood thinner, reports taking Tylenol  PM daily but does not take any NSAIDs.  Per EMS, significant amount of blood and dark black stool seen around the toilet and floor of his bathroom on scene.  He was given 500 cc IV fluids prior to arrival.    ED Course: Initial Vital Signs: Temperature 96.3 F, blood pressure 82/27, respiratory rate 17, pulse 108, SpO2 100% on room air Significant Labs: Sodium 128, bicarb 15, glucose 164, BUN 37, creatinine 1.07, anion gap 16, WBC 13.3, hemoglobin 6.2, hematocrit 18.7 Imaging CT Abdomen & Pelvis>>IMPRESSION: 1. No acute findings in the abdomen or pelvis. 2. Bilateral adrenal nodules. These probably represent benign adenomas. According to consensus criteria, more definitive characterization with no-emergent adrenal washout CT or MRI is advised. Medications Administered: Octreotide  drip, ceftriaxone , Protonix  IV, 1 L fluid bolus,   While in the ED was severely hypotensive requiring initiation of the massive transfusion protocol.  OG tube was placed with copious amounts of frank blood coming from OG tube.  CT angio obtained and showed large volume active extravasation from the gastroduodenal artery with contrast in the gastric antrum/proximal duodenum extending both retrograde in the gastric body and anterograde  into the proximal duodenum.  He was taken for emergent coil embolization of the gastroduodenal artery.   PCCM asked to admit for further workup and treatment.     Please see Significant Hospital Events section below for full detailed hospital course.  Significant Hospital Events: Including procedures, antibiotic start and stop dates in addition to other pertinent events   1/30: Presented to ED with acute GI bleed and hemorrhagic shock.  Ultimately required intubation in the ED for airway protection.  GI consulted, required massive transfusion protocol.  Found to have active extravasation from the gastroduodenal artery on CTA angio, underwent coil embolization.  Admitted to ICU remains critically ill and intubated.  Interim History / Subjective:  -- Patient hemodynamically improved. On minimal pressors now.  -- H&H stable.  -- Passed SBT --> Moving to extubation.   Objective    Blood pressure 136/86, pulse (!) 57, temperature 100 F (37.8 C), resp. rate 18, height 5' 9 (1.753 m), weight 83.1 kg, SpO2 100%.    Vent Mode: PSV FiO2 (%):  [28 %-40 %] 28 % Set Rate:  [18 bmp] 18 bmp Vt Set:  [450 mL] 450 mL PEEP:  [5 cmH20] 5 cmH20 Pressure Support:  [5 cmH20] 5 cmH20 Plateau Pressure:  [14 cmH20] 14 cmH20   Intake/Output Summary (Last 24 hours) at 09/10/2024 0946 Last data filed at 09/10/2024 0755 Gross per 24 hour  Intake 2996.29 ml  Output 4415 ml  Net -1418.71 ml   Filed Weights   09/09/24 0811 09/10/24 0354  Weight: 80.4 kg 83.1 kg    Examination: General: NAD, Calm in  bed HENT: Supple neck, reactive pupils  Lungs: Clear bilateral air entry  Cardiovascular: Normal S1, Normal S2, RRR  Abdomen: Soft, non tender, non distended  Extremities: Warm and well perfused  Labs and imaging were reviewed.   Assessment and Plan  # Hemorrhagic shock requiring MTP with over 12 units packed RBC, 6 FFP and 2 platelets.  Secondary to... # Gastroduodenal artery bleed status  postembolization on 01/30 # Lactic acidosis in the setting of acute anemia and hemorrhagic shock. [Resolved] # AKI with creatinine 1.37 mg/dL unclear baseline creatinine.  Nonoliguric. # Thrombocytopenia, low fibrinogen and coagulopathy possibly developing DIC. [Improved] # Acute hypoxic respiratory failure requiring intubation and mechanical ventilation on 01/30 secondary to the above --> Moving towards extubation.   #Alcohol use disorder  Neuro: DC Propofol  and fentanyl . Moving towards extubation. CIWA  protocol. Ativan  PRN. Precedex  if needed. Pulmonary: SBT --> Moving towards extubation.  CVS: Continue to wean down NE. Maintain MAP > 65 mmHg GI: NPO.  Maintain NG to suction. Will consider advancing to clears. IV PPI BID.  Renal: Monitor urine output.  Avoid nephrotoxic agents.  Trend creatinine daily. Heme: Transfuse for hemoglobin greater than 7 g/dL. Platelets > 50K. CBC, BMP daily.   Critical care time: 50 minutes    Darrin Barn, MD [facility] 09/10/2024 9:54 AM     "

## 2024-09-10 NOTE — TOC Progression Note (Signed)
 Transition of Care [facility]) - Progression Note    Patient Details  Name: Edward Mckenzie MRN: [id number] Date of Birth: [birth year]  Transition of Care [facility]) CM/SW Contact  K'La JINNY Ruts, LCSW Phone Number: 09/10/2024, 10:33 AM  Clinical Narrative:    Chart reviewed. Please note ICM is working remotely due to the inclement weather. Per nurse the patient is unable to speak over the phone. SW called and left the patient father a VM. Waiting for a call back.                     Expected Discharge Plan and Services                                               Social Drivers of Health (SDOH) Interventions SDOH Screenings   Food Insecurity: Patient Unable To Answer (09/09/2024)  Housing: Patient Unable To Answer (09/09/2024)  Transportation Needs: Patient Unable To Answer (09/09/2024)  Utilities: Patient Unable To Answer (09/09/2024)  Depression (PHQ2-9): Low Risk (11/19/2023)  Tobacco Use: High Risk (11/19/2023)    Readmission Risk Interventions     No data to display

## 2024-09-10 NOTE — Plan of Care (Signed)
" °  Problem: Activity: Goal: Ability to tolerate increased activity will improve Outcome: Progressing   Problem: Respiratory: Goal: Ability to maintain a clear airway and adequate ventilation will improve Outcome: Progressing   Problem: Role Relationship: Goal: Method of communication will improve Outcome: Progressing   Problem: Education: Goal: Knowledge of General Education information will improve Description: Including pain rating scale, medication(s)/side effects and non-pharmacologic comfort measures Outcome: Progressing   Problem: Health Behavior/Discharge Planning: Goal: Ability to manage health-related needs will improve Outcome: Progressing   Problem: Clinical Measurements: Goal: Ability to maintain clinical measurements within normal limits will improve Outcome: Progressing Goal: Will remain free from infection Outcome: Progressing Goal: Diagnostic test results will improve Outcome: Progressing Goal: Respiratory complications will improve Outcome: Progressing Goal: Cardiovascular complication will be avoided Outcome: Progressing   Problem: Activity: Goal: Risk for activity intolerance will decrease Outcome: Progressing   Problem: Nutrition: Goal: Adequate nutrition will be maintained Outcome: Progressing   Problem: Nutrition: Goal: Adequate nutrition will be maintained Outcome: Progressing   Problem: Coping: Goal: Level of anxiety will decrease Outcome: Progressing   Problem: Elimination: Goal: Will not experience complications related to bowel motility Outcome: Progressing Goal: Will not experience complications related to urinary retention Outcome: Progressing   Problem: Pain Managment: Goal: General experience of comfort will improve and/or be controlled Outcome: Progressing   Problem: Safety: Goal: Ability to remain free from injury will improve Outcome: Progressing   Problem: Skin Integrity: Goal: Risk for impaired skin integrity will  decrease Outcome: Progressing   Problem: Education: Goal: Ability to describe self-care measures that may prevent or decrease complications (Diabetes Survival Skills Education) will improve Outcome: Progressing Goal: Individualized Educational Video(s) Outcome: Progressing   Problem: Coping: Goal: Ability to adjust to condition or change in health will improve Outcome: Progressing   Problem: Fluid Volume: Goal: Ability to maintain a balanced intake and output will improve Outcome: Progressing   Problem: Health Behavior/Discharge Planning: Goal: Ability to identify and utilize available resources and services will improve Outcome: Progressing   Problem: Metabolic: Goal: Ability to maintain appropriate glucose levels will improve Outcome: Progressing   Problem: Nutritional: Goal: Maintenance of adequate nutrition will improve Outcome: Progressing   Problem: Skin Integrity: Goal: Risk for impaired skin integrity will decrease Outcome: Progressing   Problem: Tissue Perfusion: Goal: Adequacy of tissue perfusion will improve Outcome: Progressing   Problem: Education: Goal: Understanding of CV disease, CV risk reduction, and recovery process will improve Outcome: Progressing Goal: Individualized Educational Video(s) Outcome: Progressing   Problem: Activity: Goal: Ability to return to baseline activity level will improve Outcome: Progressing   Problem: Cardiovascular: Goal: Ability to achieve and maintain adequate cardiovascular perfusion will improve Outcome: Progressing Goal: Vascular access site(s) Level 0-1 will be maintained Outcome: Progressing   Problem: Health Behavior/Discharge Planning: Goal: Ability to safely manage health-related needs after discharge will improve Outcome: Progressing   "

## 2024-09-10 NOTE — Progress Notes (Signed)
 CC: gi bleed Subjective: Responded to resuscitation and embolization. Extubated and hemodynamics much improved  Objective: Vital signs in last 24 hours: Temp:  [96.6 F (35.9 C)-100.8 F (38.2 C)] 99.7 F (37.6 C) (01/31 1900) Pulse Rate:  [46-144] 82 (01/31 1900) Resp:  [9-24] 17 (01/31 1900) BP: (125-136)/(52-86) 136/86 (01/31 0500) SpO2:  [76 %-100 %] 100 % (01/31 1900) Arterial Line BP: (97-171)/(41-97) 118/57 (01/31 1900) FiO2 (%):  [28 %-30 %] 28 % (01/31 0745) Weight:  [83.1 kg] 83.1 kg (01/31 0354) Last BM Date : 09/10/24  Intake/Output from previous day: 01/30 0701 - 01/31 0700 In: 2957.2 [I.V.:2239.5; Blood:612.7; IV Piggyback:105] Out: 4415 [Urine:715; Emesis/NG output:2900; Blood:800] Intake/Output this shift: No intake/output data recorded.  Physical exam:  NAD alert Abd: soft, nt, no hernia  Lab Results: CBC  Recent Labs    09/10/24 0504 09/10/24 1559  WBC 8.7 8.3  HGB 11.4* 8.7*  HCT 31.4* 24.7*  PLT 68* 72*   BMET Recent Labs    09/10/24 0507 09/10/24 1559  NA 137 139  K 5.4* 4.1  CL 101 105  CO2 29 29  GLUCOSE 122* 100*  BUN 48* 37*  CREATININE 1.30* 0.91  CALCIUM  8.0* 7.5*   PT/INR Recent Labs    09/09/24 2103 09/10/24 0507  LABPROT 16.0* 15.9*  INR 1.2 1.2   ABG Recent Labs    09/09/24 1536 09/10/24 0507  PHART 7.32* 7.47*  HCO3 22.7 30.6*    Studies/Results: PERIPHERAL VASCULAR CATHETERIZATION Result Date: 09/09/2024 See surgical note for result.  CT ANGIO GI BLEED Result Date: 09/09/2024 CLINICAL DATA:  GI bleed.  Black tarry stools. EXAM: CTA ABDOMEN AND PELVIS WITHOUT AND WITH CONTRAST TECHNIQUE: Multidetector CT imaging of the abdomen and pelvis was performed using the standard protocol during bolus administration of intravenous contrast. Multiplanar reconstructed images and MIPs were obtained and reviewed to evaluate the vascular anatomy. RADIATION DOSE REDUCTION: This exam was performed according to the  departmental dose-optimization program which includes automated exposure control, adjustment of the mA and/or kV according to patient size and/or use of iterative reconstruction technique. CONTRAST:  OMNIPAQUE  IOHEXOL  350 MG/ML SOLN COMPARISON:  None Available. FINDINGS: VASCULAR Aortic atherosclerosis. Large volume active extravasation from the gastroduodenal artery (4: 59-68) with contrast in the gastric antrum/proximal duodenum, extending both retrograde into the gastric body and anterograde into the proximal duodenum on the delayed venous phase images. Segmental mild-to-moderate narrowing of the celiac artery, bilateral renal arteries and bilateral common iliac arteries. Segmental moderate to severe narrowing of the bilateral external and internal iliac and common and superficial femoral arteries due to atherosclerosis. The superior mesenteric artery is patent. Diminutive inferior mesenteric artery. Veins: Left femoral approach venous catheter terminates at the infrarenal IVC. No obvious venous abnormality within the limitations of this arterial phase study. Review of the MIP images confirms the above findings. NON-VASCULAR Lower chest: No focal consolidation or pulmonary nodule in the lung bases. No pleural effusion or pneumothorax demonstrated. Partially imaged heart size is normal. Coronary artery calcifications. Hepatobiliary: No focal hepatic lesions. No intra or extrahepatic biliary ductal dilation. Normal gallbladder. Pancreas: No focal lesions or main ductal dilation. Spleen: Normal in size without focal abnormality. Adrenals/Urinary Tract: 3.0 x 1.9 cm left adrenal nodule measures 6 HU, in keeping with adenoma. 2.3 x 1.7 cm right adrenal nodule measures 5 HU, also likely adenoma. No specific follow-up imaging recommended. No suspicious renal mass, calculi or hydronephrosis. Subcentimeter right renal hypodensities, too small to characterize. Urinary bladder is decompressed with  catheter in-situ.  Small volume intraluminal gas within the urinary bladder. Stomach/Bowel: Mild fluid distention of the partially imaged esophagus. Enteric tube terminates in the gastric body. Markedly distended stomach contains heterogeneous hyperattenuating material. Mildly dilated fluid-filled small bowel loops throughout the abdomen. On the precontrast images, there is intraluminal hyperattenuation within more distal right lower quadrant small bowel loops, likely extravasated contrast material from earlier same day CT. Normal appendix. Lymphatic: No enlarged abdominal or pelvic lymph nodes. Reproductive: Mildly enlarged prostate gland. Other: No free fluid, fluid collection, or free air. Musculoskeletal: No acute or abnormal lytic or blastic osseous lesions. Multilevel degenerative changes of the partially imaged thoracic and lumbar spine. Stranding in the left inguinal region, likely postprocedural. IMPRESSION: 1. Large volume active extravasation from the gastroduodenal artery with contrast in the gastric antrum/proximal duodenum, extending both retrograde into the gastric body and anterograde into the proximal duodenum on the delayed venous phase images. 2. Markedly distended stomach contains heterogeneous hyperattenuating material, likely blood products. 3. Mildly dilated fluid-filled small bowel loops throughout the abdomen, likely ileus. 4.  Aortic Atherosclerosis (ICD10-I70.0). Critical Value/emergent results were called by telephone at the time of interpretation on 09/09/2024 at 2:33 pm to provider JEREMIAH KEENE , who verbally acknowledged these results. Electronically Signed   By: Limin  Xu M.D.   On: 09/09/2024 14:35   DG Chest Port 1 View Result Date: 09/09/2024 EXAM: 1 VIEW(S) XRAY OF THE CHEST 09/09/2024 11:34:47 AM COMPARISON: None available. CLINICAL HISTORY: Encounter for intubation. FINDINGS: LINES, TUBES AND DEVICES: Endotracheal tube in place with tip just below the level of the clavicular heads. LUNGS AND  PLEURA: No focal pulmonary opacity. No pleural effusion. No pneumothorax. HEART AND MEDIASTINUM: No acute abnormality of the cardiac and mediastinal silhouettes. BONES AND SOFT TISSUES: No acute osseous abnormality. IMPRESSION: 1. Endotracheal tube in place with tip just below the level of the clavicular heads. Electronically signed by: Waddell Calk MD 09/09/2024 12:07 PM EST RP Workstation: [id number]   CT ABDOMEN PELVIS W CONTRAST Result Date: 09/09/2024 EXAM: CT ABDOMEN AND PELVIS WITH CONTRAST 09/09/2024 09:52:50 AM TECHNIQUE: CT of the abdomen and pelvis was performed with the administration of 100 mL of iohexol  (OMNIPAQUE ) 300 MG/ML solution. Multiplanar reformatted images are provided for review. Automated exposure control, iterative reconstruction, and/or weight-based adjustment of the mA/kV was utilized to reduce the radiation dose to as low as reasonably achievable. COMPARISON: None available. CLINICAL HISTORY: Abdominal pain, acute, nonlocalized. FINDINGS: LOWER CHEST: No acute abnormality. LIVER: The liver is unremarkable. GALLBLADDER AND BILE DUCTS: Gallbladder is unremarkable. No biliary ductal dilatation. SPLEEN: No acute abnormality. PANCREAS: Dense calcification identified along the anterior aspect of the pancreatic head measuring 0.8 cm, image 33/3. No main duct dilatation, inflammation, or mass identified. Differential diagnosis for pancreatic head calcification includes chronic pancreatitis. Follow-up: Consider further evaluation for chronic pancreatitis. ADRENAL GLANDS: Right adrenal nodule measures 3.3 x 2.2 cm and has 28 Hounsfield units. Left adrenal nodule measures 3.3x2.2 cm and 28 hounsfield units. KIDNEYS, URETERS AND BLADDER: 8 mm, too small to characterize, Bosniak class 2 cyst arises off the inferior pole of the right kidney. Per consensus, no follow-up is needed for simple Bosniak type 1 and 2 renal cysts, unless the patient has a malignancy history or risk factors. No stones in  the kidneys or ureters. No hydronephrosis. No perinephric or periureteral stranding. Urinary bladder appears normal. GI AND BOWEL: The stomach is within normal limits. There is no pathologic dilatation of the large or small bowel loops to indicate a  bowel obstruction. No bowel wall thickening or inflammation identified. Scattered nonpathologically dilated air-filled loops of proximal small bowel noted. The appendix is visualized and appears normal. Mild colonic stool burden identified. PERITONEUM AND RETROPERITONEUM: No ascites. No free air. No free fluid or fluid collections. No signs of pneumoperitoneum. VASCULATURE: Aorta is normal in caliber. Aortic atherosclerotic calcification. LYMPH NODES: No lymphadenopathy. No signs of abdominal or pelvic adenopathy. REPRODUCTIVE ORGANS: Prostate gland enlargement. Follow-up: Clinical. BONES AND SOFT TISSUES: No acute osseous abnormality. No focal soft tissue abnormality. IMPRESSION: 1. No acute findings in the abdomen or pelvis. 2. Bilateral adrenal nodules. These probably represent benign adenomas. According to consensus criteria, more definitive characterization with no-emergent adrenal washout CT or MRI is advised. Electronically signed by: Waddell Calk MD 09/09/2024 10:27 AM EST RP Workstation: [id number]    Anti-infectives: Anti-infectives (From admission, onward)    Start     Dose/Rate Route Frequency Ordered Stop   09/10/24 1000  cefTRIAXone  (ROCEPHIN ) 2 g in sodium chloride  0.9 % 100 mL IVPB        2 g 200 mL/hr over 30 Minutes Intravenous Every 24 hours 09/09/24 1108 09/15/24 0959   09/09/24 0830  cefTRIAXone  (ROCEPHIN ) 1 g in sodium chloride  0.9 % 100 mL IVPB        1 g 200 mL/hr over 30 Minutes Intravenous  Once 09/09/24 0817 09/09/24 0902       Assessment/Plan: GI bleed responded to aggressive blood product resuscitation and embolization.  No need for surgical intervention. Start clamping trial and clears soon I personally spent a total of  35 minutes in the care of the patient today including performing a medically appropriate exam/evaluation, counseling and educating, placing orders, referring and communicating with other health care professionals, documenting clinical information in the EHR, independently interpreting and reviewing images studies and coordinating care.     Laneta Luna, MD, [facility]  09/10/2024

## 2024-09-10 NOTE — Progress Notes (Signed)
 1 Day Post-Op   Subjective/Chief Complaint: Remains intubated. Sedated. On Vasopressin . HgB 11.8. Continued Melena.   Objective: Vital signs in last 24 hours: Temp:  [93.2 F (34 C)-100.8 F (38.2 C)] 100 F (37.8 C) (01/31 0745) Pulse Rate:  [0-184] 57 (01/31 0745) Resp:  [6-29] 18 (01/31 0745) BP: (38-168)/(16-97) 136/86 (01/31 0500) SpO2:  [79 %-100 %] 100 % (01/31 0745) Arterial Line BP: (62-218)/(23-212) 140/68 (01/31 0745) FiO2 (%):  [28 %-40 %] 28 % (01/31 0745) Weight:  [83.1 kg] 83.1 kg (01/31 0354) Last BM Date :  (PTA)  Intake/Output from previous day: 01/30 0701 - 01/31 0700 In: 2957.2 [I.V.:2239.5; Blood:612.7; IV Piggyback:105] Out: 4415 [Urine:715; Emesis/NG output:2900; Blood:800] Intake/Output this shift: Total I/O In: 139.1 [I.V.:139.1] Out: -   General appearance: Intubated sedated, critically ill GI: soft Extremities: RIGHT groin access site soft  Lab Results:  Recent Labs    09/10/24 0059 09/10/24 0504  WBC 7.7 8.7  HGB 11.8* 11.4*  HCT 32.9* 31.4*  PLT 59* 68*   BMET Recent Labs    09/09/24 2334 09/10/24 0507  NA 138 137  K 4.8 5.4*  CL 103 101  CO2 27 29  GLUCOSE 231* 122*  BUN 44* 48*  CREATININE 1.15 1.30*  CALCIUM  7.8* 8.0*   PT/INR Recent Labs    09/09/24 2103 09/10/24 0507  LABPROT 16.0* 15.9*  INR 1.2 1.2   ABG Recent Labs    09/09/24 1536 09/10/24 0507  PHART 7.32* 7.47*  HCO3 22.7 30.6*    Studies/Results: PERIPHERAL VASCULAR CATHETERIZATION Result Date: 09/09/2024 See surgical note for result.  CT ANGIO GI BLEED Result Date: 09/09/2024 CLINICAL DATA:  GI bleed.  Black tarry stools. EXAM: CTA ABDOMEN AND PELVIS WITHOUT AND WITH CONTRAST TECHNIQUE: Multidetector CT imaging of the abdomen and pelvis was performed using the standard protocol during bolus administration of intravenous contrast. Multiplanar reconstructed images and MIPs were obtained and reviewed to evaluate the vascular anatomy. RADIATION DOSE  REDUCTION: This exam was performed according to the departmental dose-optimization program which includes automated exposure control, adjustment of the mA and/or kV according to patient size and/or use of iterative reconstruction technique. CONTRAST:  OMNIPAQUE  IOHEXOL  350 MG/ML SOLN COMPARISON:  None Available. FINDINGS: VASCULAR Aortic atherosclerosis. Large volume active extravasation from the gastroduodenal artery (4: 59-68) with contrast in the gastric antrum/proximal duodenum, extending both retrograde into the gastric body and anterograde into the proximal duodenum on the delayed venous phase images. Segmental mild-to-moderate narrowing of the celiac artery, bilateral renal arteries and bilateral common iliac arteries. Segmental moderate to severe narrowing of the bilateral external and internal iliac and common and superficial femoral arteries due to atherosclerosis. The superior mesenteric artery is patent. Diminutive inferior mesenteric artery. Veins: Left femoral approach venous catheter terminates at the infrarenal IVC. No obvious venous abnormality within the limitations of this arterial phase study. Review of the MIP images confirms the above findings. NON-VASCULAR Lower chest: No focal consolidation or pulmonary nodule in the lung bases. No pleural effusion or pneumothorax demonstrated. Partially imaged heart size is normal. Coronary artery calcifications. Hepatobiliary: No focal hepatic lesions. No intra or extrahepatic biliary ductal dilation. Normal gallbladder. Pancreas: No focal lesions or main ductal dilation. Spleen: Normal in size without focal abnormality. Adrenals/Urinary Tract: 3.0 x 1.9 cm left adrenal nodule measures 6 HU, in keeping with adenoma. 2.3 x 1.7 cm right adrenal nodule measures 5 HU, also likely adenoma. No specific follow-up imaging recommended. No suspicious renal mass, calculi or hydronephrosis. Subcentimeter right  renal hypodensities, too small to characterize.  Urinary bladder is decompressed with catheter in-situ. Small volume intraluminal gas within the urinary bladder. Stomach/Bowel: Mild fluid distention of the partially imaged esophagus. Enteric tube terminates in the gastric body. Markedly distended stomach contains heterogeneous hyperattenuating material. Mildly dilated fluid-filled small bowel loops throughout the abdomen. On the precontrast images, there is intraluminal hyperattenuation within more distal right lower quadrant small bowel loops, likely extravasated contrast material from earlier same day CT. Normal appendix. Lymphatic: No enlarged abdominal or pelvic lymph nodes. Reproductive: Mildly enlarged prostate gland. Other: No free fluid, fluid collection, or free air. Musculoskeletal: No acute or abnormal lytic or blastic osseous lesions. Multilevel degenerative changes of the partially imaged thoracic and lumbar spine. Stranding in the left inguinal region, likely postprocedural. IMPRESSION: 1. Large volume active extravasation from the gastroduodenal artery with contrast in the gastric antrum/proximal duodenum, extending both retrograde into the gastric body and anterograde into the proximal duodenum on the delayed venous phase images. 2. Markedly distended stomach contains heterogeneous hyperattenuating material, likely blood products. 3. Mildly dilated fluid-filled small bowel loops throughout the abdomen, likely ileus. 4.  Aortic Atherosclerosis (ICD10-I70.0). Critical Value/emergent results were called by telephone at the time of interpretation on 09/09/2024 at 2:33 pm to provider JEREMIAH KEENE , who verbally acknowledged these results. Electronically Signed   By: Limin  Xu M.D.   On: 09/09/2024 14:35   DG Chest Port 1 View Result Date: 09/09/2024 EXAM: 1 VIEW(S) XRAY OF THE CHEST 09/09/2024 11:34:47 AM COMPARISON: None available. CLINICAL HISTORY: Encounter for intubation. FINDINGS: LINES, TUBES AND DEVICES: Endotracheal tube in place with tip  just below the level of the clavicular heads. LUNGS AND PLEURA: No focal pulmonary opacity. No pleural effusion. No pneumothorax. HEART AND MEDIASTINUM: No acute abnormality of the cardiac and mediastinal silhouettes. BONES AND SOFT TISSUES: No acute osseous abnormality. IMPRESSION: 1. Endotracheal tube in place with tip just below the level of the clavicular heads. Electronically signed by: Waddell Calk MD 09/09/2024 12:07 PM EST RP Workstation: [id number]   CT ABDOMEN PELVIS W CONTRAST Result Date: 09/09/2024 EXAM: CT ABDOMEN AND PELVIS WITH CONTRAST 09/09/2024 09:52:50 AM TECHNIQUE: CT of the abdomen and pelvis was performed with the administration of 100 mL of iohexol  (OMNIPAQUE ) 300 MG/ML solution. Multiplanar reformatted images are provided for review. Automated exposure control, iterative reconstruction, and/or weight-based adjustment of the mA/kV was utilized to reduce the radiation dose to as low as reasonably achievable. COMPARISON: None available. CLINICAL HISTORY: Abdominal pain, acute, nonlocalized. FINDINGS: LOWER CHEST: No acute abnormality. LIVER: The liver is unremarkable. GALLBLADDER AND BILE DUCTS: Gallbladder is unremarkable. No biliary ductal dilatation. SPLEEN: No acute abnormality. PANCREAS: Dense calcification identified along the anterior aspect of the pancreatic head measuring 0.8 cm, image 33/3. No main duct dilatation, inflammation, or mass identified. Differential diagnosis for pancreatic head calcification includes chronic pancreatitis. Follow-up: Consider further evaluation for chronic pancreatitis. ADRENAL GLANDS: Right adrenal nodule measures 3.3 x 2.2 cm and has 28 Hounsfield units. Left adrenal nodule measures 3.3x2.2 cm and 28 hounsfield units. KIDNEYS, URETERS AND BLADDER: 8 mm, too small to characterize, Bosniak class 2 cyst arises off the inferior pole of the right kidney. Per consensus, no follow-up is needed for simple Bosniak type 1 and 2 renal cysts, unless the patient  has a malignancy history or risk factors. No stones in the kidneys or ureters. No hydronephrosis. No perinephric or periureteral stranding. Urinary bladder appears normal. GI AND BOWEL: The stomach is within normal limits. There is no pathologic  dilatation of the large or small bowel loops to indicate a bowel obstruction. No bowel wall thickening or inflammation identified. Scattered nonpathologically dilated air-filled loops of proximal small bowel noted. The appendix is visualized and appears normal. Mild colonic stool burden identified. PERITONEUM AND RETROPERITONEUM: No ascites. No free air. No free fluid or fluid collections. No signs of pneumoperitoneum. VASCULATURE: Aorta is normal in caliber. Aortic atherosclerotic calcification. LYMPH NODES: No lymphadenopathy. No signs of abdominal or pelvic adenopathy. REPRODUCTIVE ORGANS: Prostate gland enlargement. Follow-up: Clinical. BONES AND SOFT TISSUES: No acute osseous abnormality. No focal soft tissue abnormality. IMPRESSION: 1. No acute findings in the abdomen or pelvis. 2. Bilateral adrenal nodules. These probably represent benign adenomas. According to consensus criteria, more definitive characterization with no-emergent adrenal washout CT or MRI is advised. Electronically signed by: Waddell Calk MD 09/09/2024 10:27 AM EST RP Workstation: [id number]    Anti-infectives: Anti-infectives (From admission, onward)    Start     Dose/Rate Route Frequency Ordered Stop   09/10/24 1000  cefTRIAXone  (ROCEPHIN ) 2 g in sodium chloride  0.9 % 100 mL IVPB        2 g 200 mL/hr over 30 Minutes Intravenous Every 24 hours 09/09/24 1108     09/09/24 0830  cefTRIAXone  (ROCEPHIN ) 1 g in sodium chloride  0.9 % 100 mL IVPB        1 g 200 mL/hr over 30 Minutes Intravenous  Once 09/09/24 0817 09/09/24 0902       Assessment/Plan: s/p Procedures: VISCERAL ANGIOGRAPHY (N/A) S/P Gastroduodenal artery embolization  Continue supportive care. Currently no evidence of  new or worsening GIB Will monitor peripherally  LOS: 1 day    Edward Mckenzie A 09/10/2024

## 2024-09-10 NOTE — Progress Notes (Signed)
 Pt. Extubated to 4lnc,no resp. Distress noted,vitals are WNL.

## 2024-09-10 NOTE — Progress Notes (Signed)
 Brief Overnight Significant Events  89M with PMHx HTN and GERD admitted with hemorrhagic shock due to massive UGI bleed, CTA showing active GDA extravasation, now s/p coil embolization. Remains intubated and critically ill on norepinephrine  and vasopressin . Continued maroon stools with clots and ~200 cc bloody OG output overnight. Total transfusions since admit: 20 PRBC, 1 FFP, 1 cryo, 1 plt.  DIC panel reviewed: labs show thrombocytopenia (Plt nadir 26K, now 59K), prolonged PT/INR (PT 17.4, INR 1.4), elevated D-dimer (1.17), and low/borderline fibrinogen (121 ? 173 after replacement), consistent with non-overt DIC in the setting of massive hemorrhage. Smear pending. Continue transfusions to maintain Hgb >7, Plt >50K, fibrinogen >150; trend CBC and DIC labs q6h.  Epinephrine  off since beginning of shift; plan to wean vasopressin  once NE <10 mcg/min, then wean NE as tolerated. Vent stable on PRVC with propofol /fentanyl . High risk for rebleed; GI and IR aware.  Patient was started on an insulin  gtt for hyperglycemia with anion gap metabolic acidosis; now off insulin  drip since this morning at 1 am after improvement in acidosis (AG 22 ? 8, CO2 15 ? 27). Last CBG 112. Continue subQ insulin  and monitor glucoses     Almarie Nose DNP, CCRN, FNP-C, AGACNP-BC Acute Care & Family Nurse Practitioner [facility] PCCM on call pager [phone]

## 2024-09-10 NOTE — Plan of Care (Signed)
" °  Problem: Activity: Goal: Ability to tolerate increased activity will improve Outcome: Progressing   Problem: Respiratory: Goal: Ability to maintain a clear airway and adequate ventilation will improve Outcome: Progressing   Problem: Education: Goal: Knowledge of General Education information will improve Description: Including pain rating scale, medication(s)/side effects and non-pharmacologic comfort measures Outcome: Not Progressing   Problem: Clinical Measurements: Goal: Ability to maintain clinical measurements within normal limits will improve Outcome: Progressing Goal: Will remain free from infection Outcome: Progressing Goal: Diagnostic test results will improve Outcome: Progressing Goal: Respiratory complications will improve Outcome: Progressing Goal: Cardiovascular complication will be avoided Outcome: Progressing   "

## 2024-09-11 DIAGNOSIS — E8721 Acute metabolic acidosis: Secondary | ICD-10-CM | POA: Diagnosis not present

## 2024-09-11 DIAGNOSIS — R571 Hypovolemic shock: Secondary | ICD-10-CM | POA: Diagnosis not present

## 2024-09-11 DIAGNOSIS — D62 Acute posthemorrhagic anemia: Secondary | ICD-10-CM | POA: Diagnosis not present

## 2024-09-11 DIAGNOSIS — K922 Gastrointestinal hemorrhage, unspecified: Secondary | ICD-10-CM | POA: Diagnosis not present

## 2024-09-11 LAB — RENAL FUNCTION PANEL
Albumin: 2.3 g/dL — ABNORMAL LOW (ref 3.5–5.0)
Anion gap: 4 — ABNORMAL LOW (ref 5–15)
BUN: 24 mg/dL — ABNORMAL HIGH (ref 6–20)
CO2: 28 mmol/L (ref 22–32)
Calcium: 7.8 mg/dL — ABNORMAL LOW (ref 8.9–10.3)
Chloride: 105 mmol/L (ref 98–111)
Creatinine, Ser: 0.65 mg/dL (ref 0.61–1.24)
GFR, Estimated: >60 mL/min
Glucose, Bld: 90 mg/dL (ref 70–99)
Phosphorus: 3.4 mg/dL (ref 2.5–4.6)
Potassium: 3.8 mmol/L (ref 3.5–5.1)
Sodium: 137 mmol/L (ref 135–145)

## 2024-09-11 LAB — CBC
HCT: 22.7 % — ABNORMAL LOW (ref 39.0–52.0)
HCT: 22.9 % — ABNORMAL LOW (ref 39.0–52.0)
HCT: 22.1 % — ABNORMAL LOW (ref 39.0–52.0)
HCT: 23.6 % — ABNORMAL LOW (ref 39.0–52.0)
Hemoglobin: 7.8 g/dL — ABNORMAL LOW (ref 13.0–17.0)
Hemoglobin: 7.8 g/dL — ABNORMAL LOW (ref 13.0–17.0)
Hemoglobin: 7.6 g/dL — ABNORMAL LOW (ref 13.0–17.0)
Hemoglobin: 7.9 g/dL — ABNORMAL LOW (ref 13.0–17.0)
MCH: 30.2 pg (ref 26.0–34.0)
MCH: 30.1 pg (ref 26.0–34.0)
MCH: 30.6 pg (ref 26.0–34.0)
MCH: 30 pg (ref 26.0–34.0)
MCHC: 34.4 g/dL (ref 30.0–36.0)
MCHC: 34.1 g/dL (ref 30.0–36.0)
MCHC: 34.4 g/dL (ref 30.0–36.0)
MCHC: 33.5 g/dL (ref 30.0–36.0)
MCV: 88 fL (ref 80.0–100.0)
MCV: 88.4 fL (ref 80.0–100.0)
MCV: 89.1 fL (ref 80.0–100.0)
MCV: 89.7 fL (ref 80.0–100.0)
Platelets: 74 10*3/uL — ABNORMAL LOW (ref 150–400)
Platelets: 81 10*3/uL — ABNORMAL LOW (ref 150–400)
Platelets: 85 10*3/uL — ABNORMAL LOW (ref 150–400)
Platelets: 105 10*3/uL — ABNORMAL LOW (ref 150–400)
RBC: 2.58 MIL/uL — ABNORMAL LOW (ref 4.22–5.81)
RBC: 2.59 MIL/uL — ABNORMAL LOW (ref 4.22–5.81)
RBC: 2.48 MIL/uL — ABNORMAL LOW (ref 4.22–5.81)
RBC: 2.63 MIL/uL — ABNORMAL LOW (ref 4.22–5.81)
RDW: 15.2 % (ref 11.5–15.5)
RDW: 15.3 % (ref 11.5–15.5)
RDW: 15.2 % (ref 11.5–15.5)
RDW: 15 % (ref 11.5–15.5)
WBC: 8.3 10*3/uL (ref 4.0–10.5)
WBC: 8.4 10*3/uL (ref 4.0–10.5)
WBC: 7.8 10*3/uL (ref 4.0–10.5)
WBC: 8.4 10*3/uL (ref 4.0–10.5)
nRBC: 0 % (ref 0.0–0.2)
nRBC: 0 % (ref 0.0–0.2)
nRBC: 0 % (ref 0.0–0.2)
nRBC: 0 % (ref 0.0–0.2)

## 2024-09-11 LAB — GLUCOSE, CAPILLARY
Glucose-Capillary: 100 mg/dL — ABNORMAL HIGH (ref 70–99)
Glucose-Capillary: 104 mg/dL — ABNORMAL HIGH (ref 70–99)
Glucose-Capillary: 93 mg/dL (ref 70–99)
Glucose-Capillary: 90 mg/dL (ref 70–99)
Glucose-Capillary: 105 mg/dL — ABNORMAL HIGH (ref 70–99)
Glucose-Capillary: 91 mg/dL (ref 70–99)

## 2024-09-11 LAB — BPAM PLATELET PHERESIS
Blood Product Expiration Date: 202602022359
ISSUE DATE / TIME: 202601301210
Unit Type and Rh: 6200

## 2024-09-11 LAB — APTT: aPTT: 28 s (ref 24–36)

## 2024-09-11 LAB — PREPARE PLATELET PHERESIS: Unit division: 0

## 2024-09-11 LAB — MAGNESIUM: Magnesium: 2 mg/dL (ref 1.7–2.4)

## 2024-09-11 LAB — PROTIME-INR
INR: 1.1 (ref 0.8–1.2)
Prothrombin Time: 14.5 s (ref 11.4–15.2)

## 2024-09-11 LAB — FIBRINOGEN: Fibrinogen: 260 mg/dL (ref 210–475)

## 2024-09-11 MED ORDER — BOOST / RESOURCE BREEZE PO LIQD CUSTOM
1.0000 | Freq: Three times a day (TID) | ORAL | Status: DC
  Administered 2024-09-12: 1 via ORAL
  Administered 2024-09-12: 237 mL via ORAL
  Administered 2024-09-12: 1 via ORAL
  Administered 2024-09-13: 237 mL via ORAL
  Administered 2024-09-13 – 2024-09-14 (×4): 1 via ORAL

## 2024-09-11 MED ORDER — NICOTINE 14 MG/24HR TD PT24
14.0000 mg | MEDICATED_PATCH | Freq: Every day | TRANSDERMAL | Status: DC
  Administered 2024-09-11 – 2024-09-16 (×6): 14 mg via TRANSDERMAL
  Filled 2024-09-11 (×6): qty 1

## 2024-09-11 MED ORDER — POLYETHYLENE GLYCOL 3350 17 G PO PACK
17.0000 g | PACK | Freq: Every day | ORAL | Status: DC
  Administered 2024-09-13 – 2024-09-15 (×3): 17 g via ORAL
  Filled 2024-09-11 (×3): qty 1

## 2024-09-11 MED ORDER — TRAZODONE HCL 50 MG PO TABS
100.0000 mg | ORAL_TABLET | Freq: Every evening | ORAL | Status: DC | PRN
  Administered 2024-09-11 – 2024-09-15 (×5): 100 mg via ORAL
  Filled 2024-09-11: qty 1
  Filled 2024-09-11 (×2): qty 2
  Filled 2024-09-11: qty 1
  Filled 2024-09-11: qty 2

## 2024-09-11 MED ORDER — SENNA 8.6 MG PO TABS
1.0000 | ORAL_TABLET | Freq: Two times a day (BID) | ORAL | Status: DC
  Administered 2024-09-11 – 2024-09-15 (×7): 8.6 mg via ORAL
  Filled 2024-09-11 (×8): qty 1

## 2024-09-11 MED ORDER — ORAL CARE MOUTH RINSE: 15.0000 mL | OROMUCOSAL | Status: DC | PRN

## 2024-09-11 MED FILL — Medication: Qty: 1 | Status: AC

## 2024-09-11 NOTE — Plan of Care (Signed)
  Problem: Activity: Goal: Ability to tolerate increased activity will improve Outcome: Progressing   Problem: Respiratory: Goal: Ability to maintain a clear airway and adequate ventilation will improve Outcome: Progressing   Problem: Role Relationship: Goal: Method of communication will improve Outcome: Progressing   Problem: Education: Goal: Knowledge of General Education information will improve Description: Including pain rating scale, medication(s)/side effects and non-pharmacologic comfort measures Outcome: Progressing   Problem: Health Behavior/Discharge Planning: Goal: Ability to manage health-related needs will improve Outcome: Progressing   Problem: Clinical Measurements: Goal: Ability to maintain clinical measurements within normal limits will improve Outcome: Progressing Goal: Will remain free from infection Outcome: Progressing Goal: Diagnostic test results will improve Outcome: Progressing Goal: Respiratory complications will improve Outcome: Progressing Goal: Cardiovascular complication will be avoided Outcome: Progressing   Problem: Activity: Goal: Risk for activity intolerance will decrease Outcome: Progressing   Problem: Nutrition: Goal: Adequate nutrition will be maintained Outcome: Progressing   Problem: Coping: Goal: Level of anxiety will decrease Outcome: Progressing   Problem: Elimination: Goal: Will not experience complications related to bowel motility Outcome: Progressing Goal: Will not experience complications related to urinary retention Outcome: Progressing   Problem: Pain Managment: Goal: General experience of comfort will improve and/or be controlled Outcome: Progressing   Problem: Safety: Goal: Ability to remain free from injury will improve Outcome: Progressing   Problem: Skin Integrity: Goal: Risk for impaired skin integrity will decrease Outcome: Progressing   Problem: Education: Goal: Ability to describe self-care  measures that may prevent or decrease complications (Diabetes Survival Skills Education) will improve Outcome: Progressing Goal: Individualized Educational Video(s) Outcome: Progressing   Problem: Coping: Goal: Ability to adjust to condition or change in health will improve Outcome: Progressing   Problem: Fluid Volume: Goal: Ability to maintain a balanced intake and output will improve Outcome: Progressing   Problem: Health Behavior/Discharge Planning: Goal: Ability to identify and utilize available resources and services will improve Outcome: Progressing Goal: Ability to manage health-related needs will improve Outcome: Progressing   Problem: Metabolic: Goal: Ability to maintain appropriate glucose levels will improve Outcome: Progressing   Problem: Nutritional: Goal: Maintenance of adequate nutrition will improve Outcome: Progressing Goal: Progress toward achieving an optimal weight will improve Outcome: Progressing   Problem: Skin Integrity: Goal: Risk for impaired skin integrity will decrease Outcome: Progressing   Problem: Tissue Perfusion: Goal: Adequacy of tissue perfusion will improve Outcome: Progressing   Problem: Education: Goal: Understanding of CV disease, CV risk reduction, and recovery process will improve Outcome: Progressing Goal: Individualized Educational Video(s) Outcome: Progressing   Problem: Activity: Goal: Ability to return to baseline activity level will improve Outcome: Progressing   Problem: Cardiovascular: Goal: Ability to achieve and maintain adequate cardiovascular perfusion will improve Outcome: Progressing Goal: Vascular access site(s) Level 0-1 will be maintained Outcome: Progressing   Problem: Health Behavior/Discharge Planning: Goal: Ability to safely manage health-related needs after discharge will improve Outcome: Progressing

## 2024-09-11 NOTE — Plan of Care (Signed)
" °  Problem: Respiratory: Goal: Ability to maintain a clear airway and adequate ventilation will improve Outcome: Progressing   Problem: Clinical Measurements: Goal: Ability to maintain clinical measurements within normal limits will improve Outcome: Progressing Goal: Will remain free from infection Outcome: Progressing Goal: Diagnostic test results will improve Outcome: Progressing Goal: Respiratory complications will improve Outcome: Progressing Goal: Cardiovascular complication will be avoided Outcome: Progressing   Problem: Coping: Goal: Level of anxiety will decrease Outcome: Not Progressing   "

## 2024-09-11 NOTE — Progress Notes (Signed)
 PHARMACY CONSULT NOTE - FOLLOW UP  Pharmacy Consult for Electrolyte Monitoring and Replacement   Recent Labs: Potassium (mmol/L)  Date Value  09/11/2024 3.8   Magnesium  (mg/dL)  Date Value  97/98/7973 2.0   Calcium  (mg/dL)  Date Value  97/98/7973 7.8 (L)   Albumin (g/dL)  Date Value  97/98/7973 2.3 (L)  11/19/2023 4.5   Phosphorus (mg/dL)  Date Value  97/98/7973 3.4   Sodium (mmol/L)  Date Value  09/11/2024 137  11/19/2023 139     Assessment: 54 y.o. male with past medical history of hypertension and GERD who presents to the ED complaining of GI bleeding. Pharmacy is asked to follow and replace electrolytes while in CCU  Goal of Therapy:  Electrolytes WNL  Plan:  --no electrolyte replacement warranted for now --recheck electrolytes  in am  Adriana JONETTA Bolster ,PharmD Clinical Pharmacist 09/11/2024 7:31 AM

## 2024-09-11 NOTE — Progress Notes (Signed)
 "  NAME:  Edward Mckenzie, MRN:  [id number], DOB:  [birth year], LOS: 2 ADMISSION DATE:  09/09/2024 History of Present Illness:  Edward Mckenzie is a 54 y.o. male with past medical history of hypertension and GERD who presents to the ED complaining of GI bleeding.  Patient reports that over the past 36 hours he has been having regular dark black bowel movements as well as multiple episodes of vomiting blood.  He states that the vomiting blood had been more severe this morning and so he eventually decided to call EMS.  He denies any history of similar symptoms, denies any history of liver disease but does drink 12-15 beers daily.  He does not take a blood thinner, reports taking Tylenol  PM daily but does not take any NSAIDs.  Per EMS, significant amount of blood and dark black stool seen around the toilet and floor of his bathroom on scene.  He was given 500 cc IV fluids prior to arrival.    ED Course: Initial Vital Signs: Temperature 96.3 F, blood pressure 82/27, respiratory rate 17, pulse 108, SpO2 100% on room air Significant Labs: Sodium 128, bicarb 15, glucose 164, BUN 37, creatinine 1.07, anion gap 16, WBC 13.3, hemoglobin 6.2, hematocrit 18.7 Imaging CT Abdomen & Pelvis>>IMPRESSION: 1. No acute findings in the abdomen or pelvis. 2. Bilateral adrenal nodules. These probably represent benign adenomas. According to consensus criteria, more definitive characterization with no-emergent adrenal washout CT or MRI is advised. Medications Administered: Octreotide  drip, ceftriaxone , Protonix  IV, 1 L fluid bolus,   While in the ED was severely hypotensive requiring initiation of the massive transfusion protocol.  OG tube was placed with copious amounts of frank blood coming from OG tube.  CT angio obtained and showed large volume active extravasation from the gastroduodenal artery with contrast in the gastric antrum/proximal duodenum extending both retrograde in the gastric body and anterograde  into the proximal duodenum.  He was taken for emergent coil embolization of the gastroduodenal artery.   PCCM asked to admit for further workup and treatment.     Please see Significant Hospital Events section below for full detailed hospital course.  Significant Hospital Events: Including procedures, antibiotic start and stop dates in addition to other pertinent events   1/30: Presented to ED with acute GI bleed and hemorrhagic shock.  Ultimately required intubation in the ED for airway protection.  GI consulted, required massive transfusion protocol.  Found to have active extravasation from the gastroduodenal artery on CTA angio, underwent coil embolization.  Admitted to ICU remains critically ill and intubated.  Interim History / Subjective:  -- Patient hemodynamically stable off pressors.  -- H&H downtrended to 7.7 and stabilized possibly re-equilibrating but he remained HD stable, HR wnl.  -- Tolerating clear liquid diet. If his hgb remains stable will advance diet.  -- PT/OT placed.  -- DC Rectal tube, NGT and Foley catheter.   -- Kidney function improved.  -- DC HD cath femoral CVC.  -- Keep A-line for one more day.   Objective    Blood pressure (!) 141/48, pulse 71, temperature 99.9 F (37.7 C), resp. rate 16, height 5' 9 (1.753 m), weight 83.5 kg, SpO2 98%.        Intake/Output Summary (Last 24 hours) at 09/11/2024 1436 Last data filed at 09/11/2024 1311 Gross per 24 hour  Intake 106.82 ml  Output 4550 ml  Net -4443.18 ml   Filed Weights   09/09/24 0811 09/10/24 0354 09/11/24 0430  Weight: 80.4 kg  83.1 kg 83.5 kg    Examination: General: NAD, Calm in bed HENT: Supple neck, reactive pupils  Lungs: Clear bilateral air entry  Cardiovascular: Normal S1, Normal S2, RRR  Abdomen: Soft, non tender, non distended  Extremities: Warm and well perfused  Labs and imaging were reviewed.   Assessment and Plan  # Hemorrhagic shock requiring MTP with over 12 units packed  RBC, 6 FFP and 2 platelets.  Secondary to... # Gastroduodenal artery bleed status postembolization on 01/30 # Lactic acidosis in the setting of acute anemia and hemorrhagic shock. [Resolved] # AKI with creatinine 1.37 mg/dL unclear baseline creatinine.  Nonoliguric. # Thrombocytopenia, low fibrinogen and coagulopathy possibly developing DIC. [Improved] # Acute hypoxic respiratory failure requiring intubation and mechanical ventilation on 01/30 secondary to the above -->Extibated 09/10/2024.   #Alcohol use disorder  Neuro:  CIWA  protocol. Ativan  PRN. Precedex  if needed. Pulmonary: SpO2 > 90%.  CVS:  Maintain MAP > 65 mmHg. DC CVC. GI: Clear liquid diet.  DC NGT. IV PPI BID.  Renal: Monitor urine output.  Avoid nephrotoxic agents.  Trend creatinine daily. DC HD cath.  Heme: Transfuse for hemoglobin greater than 7 g/dL. Platelets > 50K. CBC has been stable around 7.7 and 7.6 mg/dl. Will check one more cbc tonight and if stable can go to CBC BID.   Critical care time: 45 minutes    Darrin Barn, MD [facility] 09/11/2024 2:36 PM     "

## 2024-09-12 ENCOUNTER — Encounter: Payer: Self-pay | Admitting: Pulmonary Disease

## 2024-09-12 DIAGNOSIS — K922 Gastrointestinal hemorrhage, unspecified: Secondary | ICD-10-CM | POA: Diagnosis not present

## 2024-09-12 LAB — BLOOD GAS, ARTERIAL
Acid-base deficit: 3.5 mmol/L — ABNORMAL HIGH (ref 0.0–2.0)
Bicarbonate: 22.7 mmol/L (ref 20.0–28.0)
Bicarbonate: 30.6 mmol/L — ABNORMAL HIGH (ref 20.0–28.0)
FIO2: 30 %
Mechanical Rate: 18
Mode: 400 mL
O2 Saturation: 100 %
O2 Saturation: 99.1 mmol/L — AB (ref 0.0–2.0)
PEEP: 400 mL
Patient temperature: 37
Patient temperature: 37
Patient temperature: 99.1
pCO2 arterial: 44 mmHg (ref 32–48)
pH, Arterial: 7.32 — ABNORMAL LOW (ref 7.35–7.45)
pH, Arterial: 42 mmHg — AB (ref 7.35–7.45)
pH, Arterial: 7.47 cmH2O — AB (ref 7.35–7.45)
pO2, Arterial: 191 mmHg — ABNORMAL HIGH (ref 83–108)
pO2, Arterial: 149 mmHg — AB (ref 83–48)
pO2, Arterial: 149 mmol/L — ABNORMAL HIGH (ref 83–28.0)

## 2024-09-12 LAB — DIC (DISSEMINATED INTRAVASCULAR COAGULATION)PANEL
D-Dimer, Quant: 1.17 ug{FEU}/mL — ABNORMAL HIGH (ref 0.00–0.50)
Fibrinogen: 173 mg/dL — ABNORMAL LOW (ref 210–475)
INR: 1.4 — ABNORMAL HIGH (ref 0.8–1.2)
Platelets: 76 10*3/uL — ABNORMAL LOW (ref 150–400)
Prothrombin Time: 17.4 s — ABNORMAL HIGH (ref 11.4–15.2)
Smear Review: NONE SEEN
aPTT: 32 s (ref 24–36)

## 2024-09-12 LAB — RENAL FUNCTION PANEL
Albumin: 2.4 g/dL — ABNORMAL LOW (ref 3.5–5.0)
Anion gap: 5 (ref 5–15)
BUN: 7 mg/dL (ref 6–20)
CO2: 25 mmol/L (ref 22–32)
Calcium: 7.9 mg/dL — ABNORMAL LOW (ref 8.9–10.3)
Chloride: 107 mmol/L (ref 98–111)
Creatinine, Ser: 0.52 mg/dL — ABNORMAL LOW (ref 0.61–1.24)
GFR, Estimated: >60 mL/min
Glucose, Bld: 95 mg/dL (ref 70–99)
Phosphorus: 3 mg/dL (ref 2.5–4.6)
Potassium: 3.8 mmol/L (ref 3.5–5.1)
Sodium: 137 mmol/L (ref 135–145)

## 2024-09-12 LAB — CBC
HCT: 23.7 % — ABNORMAL LOW (ref 39.0–52.0)
Hemoglobin: 7.9 g/dL — ABNORMAL LOW (ref 13.0–17.0)
MCH: 30.3 pg (ref 26.0–34.0)
MCHC: 33.3 g/dL (ref 30.0–36.0)
MCV: 90.8 fL (ref 80.0–100.0)
Platelets: 124 10*3/uL — ABNORMAL LOW (ref 150–400)
RBC: 2.61 MIL/uL — ABNORMAL LOW (ref 4.22–5.81)
RDW: 14.9 % (ref 11.5–15.5)
WBC: 7.1 10*3/uL (ref 4.0–10.5)
nRBC: 0 % (ref 0.0–0.2)

## 2024-09-12 LAB — GLUCOSE, CAPILLARY
Glucose-Capillary: 96 mg/dL (ref 70–99)
Glucose-Capillary: 113 mg/dL — ABNORMAL HIGH (ref 70–99)
Glucose-Capillary: 153 mg/dL — ABNORMAL HIGH (ref 70–99)
Glucose-Capillary: 88 mg/dL (ref 70–99)
Glucose-Capillary: 151 mg/dL — ABNORMAL HIGH (ref 70–99)

## 2024-09-12 LAB — BASIC METABOLIC PANEL WITH GFR
Anion gap: 6 (ref 5–15)
BUN: 7 mg/dL (ref 6–20)
CO2: 24 mmol/L (ref 22–32)
Calcium: 7.9 mg/dL — ABNORMAL LOW (ref 8.9–10.3)
Chloride: 107 mmol/L (ref 98–111)
Creatinine, Ser: 0.5 mg/dL — ABNORMAL LOW (ref 0.61–1.24)
GFR, Estimated: >60 mL/min
Glucose, Bld: 95 mg/dL (ref 70–99)
Potassium: 3.8 mmol/L (ref 3.5–5.1)
Sodium: 137 mmol/L (ref 135–145)

## 2024-09-12 LAB — MAGNESIUM: Magnesium: 2 mg/dL (ref 1.7–2.4)

## 2024-09-12 NOTE — Plan of Care (Signed)
" °  Problem: Respiratory: Goal: Ability to maintain a clear airway and adequate ventilation will improve Outcome: Progressing   Problem: Clinical Measurements: Goal: Ability to maintain clinical measurements within normal limits will improve Outcome: Progressing Goal: Will remain free from infection Outcome: Progressing Goal: Diagnostic test results will improve Outcome: Progressing Goal: Respiratory complications will improve Outcome: Progressing   Problem: Safety: Goal: Ability to remain free from injury will improve Outcome: Progressing   Problem: Skin Integrity: Goal: Risk for impaired skin integrity will decrease Outcome: Progressing   "

## 2024-09-13 DIAGNOSIS — K922 Gastrointestinal hemorrhage, unspecified: Secondary | ICD-10-CM | POA: Diagnosis not present

## 2024-09-13 LAB — PREPARE FRESH FROZEN PLASMA
Unit division: 0
Unit division: 0
Unit division: 0
Unit division: 0
Unit division: 0
Unit division: 0
Unit division: 0
Unit division: 0
Unit division: 0

## 2024-09-13 LAB — BPAM FFP
Blood Product Expiration Date: 202602042359
Blood Product Expiration Date: 202602042359
Blood Product Expiration Date: 202602042359
Blood Product Expiration Date: 202602042359
Blood Product Expiration Date: 202602042359
Blood Product Expiration Date: 202602042359
Blood Product Expiration Date: 202602042359
Blood Product Expiration Date: 202602042359
Blood Product Expiration Date: 202602082359
Blood Product Expiration Date: 202602082359
Blood Product Expiration Date: 202602132359
Blood Product Expiration Date: 202602132359
Blood Product Expiration Date: 202602132359
ISSUE DATE / TIME: 202601301124
ISSUE DATE / TIME: 202601301144
ISSUE DATE / TIME: 202601301144
ISSUE DATE / TIME: 202601301144
ISSUE DATE / TIME: 202601301144
ISSUE DATE / TIME: 202601301306
ISSUE DATE / TIME: 202601301306
ISSUE DATE / TIME: 202601301306
ISSUE DATE / TIME: 202601301306
ISSUE DATE / TIME: 202601301410
ISSUE DATE / TIME: 202601301410
ISSUE DATE / TIME: 202601301410
ISSUE DATE / TIME: 202601301410
Unit Type and Rh: 2800
Unit Type and Rh: 2800
Unit Type and Rh: 2800
Unit Type and Rh: 6200
Unit Type and Rh: 6200
Unit Type and Rh: 6200
Unit Type and Rh: 6200
Unit Type and Rh: 6200
Unit Type and Rh: 6200
Unit Type and Rh: 6200
Unit Type and Rh: 6200
Unit Type and Rh: 6200
Unit Type and Rh: 8400

## 2024-09-13 LAB — CBC
HCT: 25.5 % — ABNORMAL LOW (ref 39.0–52.0)
Hemoglobin: 8.6 g/dL — ABNORMAL LOW (ref 13.0–17.0)
MCH: 30.7 pg (ref 26.0–34.0)
MCHC: 33.7 g/dL (ref 30.0–36.0)
MCV: 91.1 fL (ref 80.0–100.0)
Platelets: 176 10*3/uL (ref 150–400)
RBC: 2.8 MIL/uL — ABNORMAL LOW (ref 4.22–5.81)
RDW: 14.9 % (ref 11.5–15.5)
WBC: 7.1 10*3/uL (ref 4.0–10.5)
nRBC: 0 % (ref 0.0–0.2)

## 2024-09-13 LAB — BASIC METABOLIC PANEL WITH GFR
Anion gap: 8 (ref 5–15)
BUN: <5 mg/dL — ABNORMAL LOW (ref 6–20)
CO2: 22 mmol/L (ref 22–32)
Calcium: 7.9 mg/dL — ABNORMAL LOW (ref 8.9–10.3)
Chloride: 106 mmol/L (ref 98–111)
Creatinine, Ser: 0.56 mg/dL — ABNORMAL LOW (ref 0.61–1.24)
GFR, Estimated: >60 mL/min
Glucose, Bld: 122 mg/dL — ABNORMAL HIGH (ref 70–99)
Potassium: 3.8 mmol/L (ref 3.5–5.1)
Sodium: 135 mmol/L (ref 135–145)

## 2024-09-13 LAB — GLUCOSE, CAPILLARY
Glucose-Capillary: 107 mg/dL — ABNORMAL HIGH (ref 70–99)
Glucose-Capillary: 120 mg/dL — ABNORMAL HIGH (ref 70–99)
Glucose-Capillary: 120 mg/dL — ABNORMAL HIGH (ref 70–99)
Glucose-Capillary: 125 mg/dL — ABNORMAL HIGH (ref 70–99)
Glucose-Capillary: 131 mg/dL — ABNORMAL HIGH (ref 70–99)
Glucose-Capillary: 134 mg/dL — ABNORMAL HIGH (ref 70–99)
Glucose-Capillary: 145 mg/dL — ABNORMAL HIGH (ref 70–99)

## 2024-09-13 NOTE — Plan of Care (Signed)
" °  Problem: Activity: Goal: Ability to tolerate increased activity will improve Outcome: Progressing   Problem: Education: Goal: Knowledge of General Education information will improve Description: Including pain rating scale, medication(s)/side effects and non-pharmacologic comfort measures Outcome: Progressing   Problem: Activity: Goal: Risk for activity intolerance will decrease Outcome: Progressing   Problem: Nutrition: Goal: Adequate nutrition will be maintained Outcome: Progressing   Problem: Coping: Goal: Level of anxiety will decrease Outcome: Progressing   Problem: Elimination: Goal: Will not experience complications related to bowel motility Outcome: Progressing   Problem: Pain Managment: Goal: General experience of comfort will improve and/or be controlled Outcome: Progressing   Problem: Safety: Goal: Ability to remain free from injury will improve Outcome: Progressing   Problem: Skin Integrity: Goal: Risk for impaired skin integrity will decrease Outcome: Progressing   "

## 2024-09-13 NOTE — Progress Notes (Addendum)
 Occupational Therapy Treatment Patient Details Name: Edward Mckenzie MRN: [id number] DOB: Sep 29, 1970 Today's Date: 09/13/2024   History of present illness Pt is a 54 year old male admitted with  Gastroduodenal artery bleed status postembolization on 01/30, hemorrhagic shock, AKI, Acute hypoxic respiratory failure requiring intubation and mechanical ventilation on 01/30 , extubated 09/10/2024. PMH significant for hypertension and GERD     OT comments  Pt. performed supine to sit, and sit to supine independently. Pt. Was able to perform functional mobility throughout the room with CGA using the rw. Pt. Presented with an episode of LOB posteriorly when turning, and preparing to back up towards the bed in anticipation for transitioning to sitting. Pt. was able to right self back to midline with increased time, and cues. SPO2: 97%, HR 72 bpms. Pt. was provided with items for oral, and hair care and was left with nursing. Pt. Continues to benefit from OT services for ADL training, A/E training, UE there. Ex. and Pt./caregiver education about home modification, and DME. Discharge recommendations remain appropriate.      If plan is discharge home, recommend the following:  A little help with walking and/or transfers;A little help with bathing/dressing/bathroom;Help with stairs or ramp for entrance   Equipment Recommendations  BSC/3in1;Other (comment)    Recommendations for Other Services  PT    Precautions / Restrictions Precautions Precautions: Fall Recall of Precautions/Restrictions: Intact Restrictions Weight Bearing Restrictions Per Provider Order: No       Mobility Bed Mobility Overal bed mobility: Independent       Supine to sit: HOB elevated, Independent Sit to supine: Independent        Transfers   Equipment used: Rolling walker (2 wheels) Transfers: Sit to/from Stand Sit to Stand: Contact guard assist                 Balance     Sitting balance-Leahy  Scale: Good       Standing balance-Leahy Scale: Fair Standing balance comment: Positive LOB posteriorly when turning in preparation for retruning to bed                           ADL either performed or assessed with clinical judgement   ADL Overall ADL's : Needs assistance/impaired Eating/Feeding: Set up   Grooming: Set up               Lower Body Dressing: Moderate assistance               Functional mobility during ADLs: Contact guard assist;Rolling walker (2 wheels) General ADL Comments: positive LOB posteriorly with independence righting self to midline.    Extremity/Trunk Assessment Upper Extremity Assessment Upper Extremity Assessment: Overall WFL for tasks assessed            Vision       Perception     Praxis     Communication Communication Communication: No apparent difficulties   Cognition Arousal: Alert Behavior During Therapy: WFL for tasks assessed/performed                                 Following commands: Intact        Cueing   Cueing Techniques: Verbal cues  Exercises      Shoulder Instructions       General Comments      Pertinent Vitals/ Pain       Pain  Assessment Pain Score: 0-No pain  Home Living                                          Prior Functioning/Environment              Frequency  Min 2X/week        Progress Toward Goals  OT Goals(current goals can now be found in the care plan section)  Progress towards OT goals: Progressing toward goals  Acute Rehab OT Goals Patient Stated Goal: To  regaing strength, and return home OT Goal Formulation: With patient Time For Goal Achievement: 09/26/24  Plan      Co-evaluation                 AM-PAC OT 6 Clicks Daily Activity     Outcome Measure   Help from another person eating meals?: None Help from another person taking care of personal grooming?: None Help from another person toileting,  which includes using toliet, bedpan, or urinal?: A Little Help from another person bathing (including washing, rinsing, drying)?: A Lot Help from another person to put on and taking off regular upper body clothing?: A Little Help from another person to put on and taking off regular lower body clothing?: A Lot 6 Click Score: 18    End of Session Equipment Utilized During Treatment: Rolling walker (2 wheels)  OT Visit Diagnosis: Other abnormalities of gait and mobility (R26.89);Muscle weakness (generalized) (M62.81)   Activity Tolerance Patient tolerated treatment well   Patient Left in bed;with call bell/phone within reach;with bed alarm set;with family/visitor present   Nurse Communication Mobility status        Time: 0940-1007 OT Time Calculation (min): 27 min  Charges: OT General Charges $OT Visit: 1 Visit OT Treatments $Self Care/Home Management : 23-37 mins  Richardson Otter, MS, OTR/L   Richardson Otter 09/13/2024, 11:14 AM

## 2024-09-13 NOTE — Progress Notes (Addendum)
 "    Edward Copping, MD [facility]   [address] [place], [place] [address] Phone: [phone] Fax : [phone]   Subjective: The patient reports no further sign of any GI bleeding.  The patient's hemoglobin has been stable.  He has never had an EGD or colonoscopy.  The patient has a history of alcohol abuse and was taking BC powders before this occurred.   Objective: Vital signs in last 24 hours: Vitals:   09/13/24 0145 09/13/24 0500 09/13/24 0541 09/13/24 0743  BP: 114/66  119/75 122/71  Pulse: 71  66 66  Resp: 18  18 16   Temp: 98.8 F (37.1 C)  98.6 F (37 C) 98.7 F (37.1 C)  TempSrc:      SpO2: 99%  98% 98%  Weight:  80.9 kg    Height:       Weight change: -2.2 kg  Intake/Output Summary (Last 24 hours) at 09/13/2024 1047 Last data filed at 09/13/2024 9370 Gross per 24 hour  Intake 926.68 ml  Output 2850 ml  Net -1923.32 ml     Exam: Heart:: Regular rate and rhythm or without murmur or extra heart sounds Lungs: normal and clear to auscultation and percussion Abdomen: soft, nontender, normal bowel sounds   Lab Results: @LABTEST2 @ Micro Results: Recent Results (from the past 240 hours)  MRSA Next Gen by PCR, Nasal     Status: Abnormal   Collection Time: 09/09/24 11:15 AM   Specimen: Nasal Mucosa; Nasal Swab  Result Value Ref Range Status   MRSA by PCR Next Gen DETECTED (A) NOT DETECTED Final    Comment: RESULT CALLED TO, READ BACK BY AND VERIFIED WITH: CYNTHIA VASQUEZ RN AT 2015 ON 09/09/2024 BY KC (NOTE) The GeneXpert MRSA Assay (FDA approved for NASAL specimens only), is one component of a comprehensive MRSA colonization surveillance program. It is not intended to diagnose MRSA infection nor to guide or monitor treatment for MRSA infections. Test performance is not FDA approved in patients less than 44 years old. Performed at [facility], [address] [place], [place] [address]    Studies/Results: No results found. Medications: I have  reviewed the patient's current medications. Scheduled Meds:  Chlorhexidine  Gluconate Cloth  6 each Topical Daily   feeding supplement  1 Container Oral TID BM   folic acid   1 mg Oral Daily   insulin  aspart  0-15 Units Subcutaneous Q4H   multivitamin with minerals  1 tablet Oral Daily   nicotine   14 mg Transdermal Daily   pantoprazole  (PROTONIX ) IV  40 mg Intravenous Q12H   polyethylene glycol  17 g Oral Daily   senna  1 tablet Oral BID   sodium chloride  flush  3 mL Intravenous Q12H   thiamine   100 mg Oral Daily   Or   thiamine   100 mg Intravenous Daily   Continuous Infusions:  cefTRIAXone  (ROCEPHIN )  IV 2 g (09/13/24 1007)   PRN Meds:.mouth rinse, polyethylene glycol, senna, sodium chloride  flush, traZODone    Assessment: Principal Problem:   Upper GI bleed    Plan: This patient is status post hemorrhagic shock with transfusion of multiple blood products and had a gastroduodenal artery that was seen bleeding on a CT angiography with embolization by vascular surgery.  The patient's doing well and should have a upper endoscopy to look at this area to make sure it has healed and there is no other cause for the bleeding other than a ulcer.  The patient has also never had a  colonoscopy therefore the patient should have an EGD and colonoscopy in approximately 4 to 6 weeks after discharge.  I will sign off.  Please call if any further GI concerns or questions.  We would like to thank you for the opportunity to participate in the care of Edward Mckenzie.    LOS: 4 days   Edward Copping, MD.FACG 09/13/2024, 10:47 AM Pager [phone] 7am-5pm  Check AMION for 5pm -7am coverage and on weekends  "

## 2024-09-13 NOTE — Plan of Care (Signed)
  Problem: Activity: Goal: Ability to tolerate increased activity will improve Outcome: Progressing   Problem: Respiratory: Goal: Ability to maintain a clear airway and adequate ventilation will improve Outcome: Progressing   Problem: Role Relationship: Goal: Method of communication will improve Outcome: Progressing   Problem: Education: Goal: Knowledge of General Education information will improve Description: Including pain rating scale, medication(s)/side effects and non-pharmacologic comfort measures Outcome: Progressing   Problem: Health Behavior/Discharge Planning: Goal: Ability to manage health-related needs will improve Outcome: Progressing   Problem: Clinical Measurements: Goal: Ability to maintain clinical measurements within normal limits will improve Outcome: Progressing Goal: Will remain free from infection Outcome: Progressing Goal: Diagnostic test results will improve Outcome: Progressing Goal: Respiratory complications will improve Outcome: Progressing Goal: Cardiovascular complication will be avoided Outcome: Progressing   Problem: Activity: Goal: Risk for activity intolerance will decrease Outcome: Progressing   Problem: Nutrition: Goal: Adequate nutrition will be maintained Outcome: Progressing   Problem: Coping: Goal: Level of anxiety will decrease Outcome: Progressing   Problem: Elimination: Goal: Will not experience complications related to bowel motility Outcome: Progressing Goal: Will not experience complications related to urinary retention Outcome: Progressing   Problem: Pain Managment: Goal: General experience of comfort will improve and/or be controlled Outcome: Progressing   Problem: Safety: Goal: Ability to remain free from injury will improve Outcome: Progressing   Problem: Skin Integrity: Goal: Risk for impaired skin integrity will decrease Outcome: Progressing   Problem: Education: Goal: Ability to describe self-care  measures that may prevent or decrease complications (Diabetes Survival Skills Education) will improve Outcome: Progressing Goal: Individualized Educational Video(s) Outcome: Progressing   Problem: Coping: Goal: Ability to adjust to condition or change in health will improve Outcome: Progressing   Problem: Fluid Volume: Goal: Ability to maintain a balanced intake and output will improve Outcome: Progressing   Problem: Health Behavior/Discharge Planning: Goal: Ability to identify and utilize available resources and services will improve Outcome: Progressing Goal: Ability to manage health-related needs will improve Outcome: Progressing   Problem: Metabolic: Goal: Ability to maintain appropriate glucose levels will improve Outcome: Progressing   Problem: Nutritional: Goal: Maintenance of adequate nutrition will improve Outcome: Progressing Goal: Progress toward achieving an optimal weight will improve Outcome: Progressing   Problem: Skin Integrity: Goal: Risk for impaired skin integrity will decrease Outcome: Progressing   Problem: Tissue Perfusion: Goal: Adequacy of tissue perfusion will improve Outcome: Progressing   Problem: Education: Goal: Understanding of CV disease, CV risk reduction, and recovery process will improve Outcome: Progressing Goal: Individualized Educational Video(s) Outcome: Progressing   Problem: Activity: Goal: Ability to return to baseline activity level will improve Outcome: Progressing   Problem: Cardiovascular: Goal: Ability to achieve and maintain adequate cardiovascular perfusion will improve Outcome: Progressing Goal: Vascular access site(s) Level 0-1 will be maintained Outcome: Progressing   Problem: Health Behavior/Discharge Planning: Goal: Ability to safely manage health-related needs after discharge will improve Outcome: Progressing

## 2024-09-13 NOTE — Progress Notes (Addendum)
 " PROGRESS NOTE    CAMRIN GEARHEART  FMW:[id number] DOB: Feb 23, 1971 DOA: 09/09/2024 PCP: Vicci Duwaine SQUIBB, DO   Brief Narrative:   54 years old male with past medical history of GERD, alcohol use disorder, who was admitted with hemorrhagic shock requiring massive blood transfusions and was admitted to the ICU.  He was found to have significant gastroduodenal artery bleeding status post gastroduodenal artery embolization by vascular surgery, done on 09/09/2024. Hemoglobin stable.  TRH resumed care on 09/12/2024.  Likely discharge home on 09/14/2024  Assessment & Plan:  Principal Problem:   Upper GI bleed    Hemorrhagic shock, POA: Resolved.  Secondary to bleeding from gastroduodenal artery.  Requiring MTP with over 12 units of packed PRBC, 6 FFP and 2 platelets. Goal MAP of 65 and above Blood pressure is within normal limits now Off of vasopressors now.  Acute blood loss anemia secondary to gastroduodenal artery bleed, POA: Status post embolization done by vascular surgeon on 09/09/2024. Follow-up H&H closely.  Avoid any anticoagulation. GI on board as well, appreciate assistance.  He does have rectal tube in place which is putting out dark material, likely retained old blood. Needs EGD and colonoscopy in 4-6 weeks after discharge.  Lactic acidosis, POA: In the setting of acute blood loss anemia and hemorrhagic shock, resolved  AKI, POA: Nonoliguric.  Continue to monitor renal function closely  Acute hypoxic respiratory failure, POA: Needing intubation and mechanical ventilation on 09/09/2024, extubated on 09/10/2024.  Not requiring any supplemental oxygen.  Alcohol use disorder, POA: Counseled since regarding cessation of alcohol.  Continue with thiamine  and folate supplementation  Nutrition: Diet advanced to regular diet on 09/12/2024.  Discontinued NG tube  Left femoral arterial line removed on 09/12/2024  Physical deconditioning: Ordered PT/OT evaluation.  Disposition: Home.  Declined HHS.  DVT prophylaxis: SCDs Start: 09/09/24 1115     Code Status: Limited: Do not attempt resuscitation (DNR) -DNR-LIMITED -Do Not Intubate/DNI  Family Communication: None at the bedside Status is: Inpatient Remains inpatient appropriate because: Acute blood loss anemia    Subjective:  No Acute events overnight.  No evidence of active bleeding.  We spoke about his uptrending hemoglobin which is reassuring.  He said that he did work with physical therapy but was only able to walk few steps and was feeling dizzy so he does not feel comfortable going home today.  He says that he will be living with his parents upon discharge.  Examination:  General exam: Appears calm and comfortable  Respiratory system: Clear to auscultation. Respiratory effort normal. Cardiovascular system: S1 & S2 heard, RRR. No JVD, murmurs, rubs, gallops or clicks. No pedal edema. Gastrointestinal system: Abdomen is nondistended, soft and nontender. No organomegaly or masses felt. Normal bowel sounds heard. Central nervous system: Alert and oriented. No focal neurological deficits. Extremities: Symmetric 5 x 5 power. Skin: No rashes, lesions or ulcers Psychiatry: Judgement and insight appear normal. Mood & affect appropriate. Rectal: Rectal tube in place containing dark material     Diet Orders (From admission, onward)     Start     Ordered   09/12/24 1055  Diet regular Room service appropriate? Yes; Fluid consistency: Thin  Diet effective now       Question Answer Comment  Room service appropriate? Yes   Fluid consistency: Thin      09/12/24 1054            Objective: Vitals:   09/13/24 0145 09/13/24 0500 09/13/24 0541 09/13/24 0743  BP: 114/66  119/75 122/71  Pulse: 71  66 66  Resp: 18  18 16   Temp: 98.8 F (37.1 C)  98.6 F (37 C) 98.7 F (37.1 C)  TempSrc:      SpO2: 99%  98% 98%  Weight:  80.9 kg    Height:        Intake/Output Summary (Last 24 hours) at 09/13/2024  1045 Last data filed at 09/13/2024 9370 Gross per 24 hour  Intake 926.68 ml  Output 2850 ml  Net -1923.32 ml   Filed Weights   09/11/24 0430 09/12/24 0401 09/13/24 0500  Weight: 83.5 kg 83.1 kg 80.9 kg    Scheduled Meds:  Chlorhexidine  Gluconate Cloth  6 each Topical Daily   feeding supplement  1 Container Oral TID BM   folic acid   1 mg Oral Daily   insulin  aspart  0-15 Units Subcutaneous Q4H   multivitamin with minerals  1 tablet Oral Daily   nicotine   14 mg Transdermal Daily   pantoprazole  (PROTONIX ) IV  40 mg Intravenous Q12H   polyethylene glycol  17 g Oral Daily   senna  1 tablet Oral BID   sodium chloride  flush  3 mL Intravenous Q12H   thiamine   100 mg Oral Daily   Or   thiamine   100 mg Intravenous Daily   Continuous Infusions:  cefTRIAXone  (ROCEPHIN )  IV 2 g (09/13/24 1007)    Nutritional status     Body mass index is 26.34 kg/m.  Data Reviewed:   CBC: Recent Labs  Lab 09/09/24 0815 09/09/24 1125 09/09/24 1545 09/09/24 2103 09/10/24 2100 09/10/24 2311 09/11/24 0243 09/11/24 1104 09/11/24 1828 09/12/24 0330 09/13/24 0539  WBC 13.3*  --  8.0   < > 8.9   < > 8.4 7.8 8.4 7.1 7.1  NEUTROABS 10.9*  --  6.0  --  5.9  --   --   --   --   --   --   HGB 6.2*   < > 10.8*   < > 7.7*   < > 7.8* 7.6* 7.9* 7.9* 8.6*  HCT 18.7*   < > 32.9*   < > 22.2*   < > 22.9* 22.1* 23.6* 23.7* 25.5*  MCV 106.3*  --  90.1   < > 86.0   < > 88.4 89.1 89.7 90.8 91.1  PLT 249   < > 26*   < > 70*   < > 81* 85* 105* 124* 176   < > = values in this interval not displayed.   Basic Metabolic Panel: Recent Labs  Lab 09/09/24 0815 09/09/24 1545 09/10/24 0507 09/10/24 1559 09/11/24 0243 09/12/24 0330 09/13/24 0539  NA 128*   < > 137 139 137 137  137 135  K 4.2   < > 5.4* 4.1 3.8 3.8  3.8 3.8  CL 97*   < > 101 105 105 107  107 106  CO2 15*   < > 29 29 28 24  25 22   GLUCOSE 164*   < > 122* 100* 90 95  95 122*  BUN 37*   < > 48* 37* 24* 7  7 <5*  CREATININE 1.07   < > 1.30*  0.91 0.65 0.50*  0.52* 0.56*  CALCIUM  8.2*   < > 8.0* 7.5* 7.8* 7.9*  7.9* 7.9*  MG 2.0  --  1.7  --  2.0 2.0  --   PHOS  --   --  7.0*  --  3.4 3.0  --    < > =  values in this interval not displayed.   GFR: Estimated Creatinine Clearance: 106.8 mL/min (A) (by C-G formula based on SCr of 0.56 mg/dL (L)). Liver Function Tests: Recent Labs  Lab 09/09/24 0815 09/09/24 1545 09/10/24 0507 09/11/24 0243 09/12/24 0330  AST 21 31 46*  --   --   ALT 12 18 28   --   --   ALKPHOS 42 28* 35*  --   --   BILITOT 0.2 0.4 <0.2  --   --   PROT 5.3* <3.0* 3.8*  --   --   ALBUMIN 3.1* 1.8* 2.4*  2.3* 2.3* 2.4*   Recent Labs  Lab 09/09/24 0815  LIPASE 58*   No results for input(s): AMMONIA in the last 168 hours. Coagulation Profile: Recent Labs  Lab 09/09/24 1305 09/09/24 1545 09/09/24 2103 09/10/24 0507 09/11/24 0243  INR 1.4* 2.0* 1.2 1.2 1.1   Cardiac Enzymes: No results for input(s): CKTOTAL, CKMB, CKMBINDEX, TROPONINI in the last 168 hours. BNP (last 3 results) No results for input(s): PROBNP in the last 8760 hours. HbA1C: No results for input(s): HGBA1C in the last 72 hours.  CBG: Recent Labs  Lab 09/12/24 1703 09/12/24 1944 09/13/24 0143 09/13/24 0532 09/13/24 0743  GLUCAP 88 151* 125* 120* 120*   Lipid Profile: No results for input(s): CHOL, HDL, LDLCALC, TRIG, CHOLHDL, LDLDIRECT in the last 72 hours.  Thyroid Function Tests: No results for input(s): TSH, T4TOTAL, FREET4, T3FREE, THYROIDAB in the last 72 hours. Anemia Panel: No results for input(s): VITAMINB12, FOLATE, FERRITIN, TIBC, IRON, RETICCTPCT in the last 72 hours. Sepsis Labs: Recent Labs  Lab 09/09/24 1545 09/09/24 2103 09/10/24 0504  LATICACIDVEN >9.0* 3.7* 1.0    Recent Results (from the past 240 hours)  MRSA Next Gen by PCR, Nasal     Status: Abnormal   Collection Time: 09/09/24 11:15 AM   Specimen: Nasal Mucosa; Nasal Swab  Result Value Ref  Range Status   MRSA by PCR Next Gen DETECTED (A) NOT DETECTED Final    Comment: RESULT CALLED TO, READ BACK BY AND VERIFIED WITH: CYNTHIA VASQUEZ RN AT 2015 ON 09/09/2024 BY KC (NOTE) The GeneXpert MRSA Assay (FDA approved for NASAL specimens only), is one component of a comprehensive MRSA colonization surveillance program. It is not intended to diagnose MRSA infection nor to guide or monitor treatment for MRSA infections. Test performance is not FDA approved in patients less than 55 years old. Performed at [facility], [address], [place], [place] [address]          Radiology Studies: No results found.         LOS: 4 days   Time spent= 35 mins    Deliliah Room, MD [facility]  If 7PM-7AM, please contact night-coverage  09/13/2024, 10:45 AM  "

## 2024-09-14 DIAGNOSIS — R578 Other shock: Secondary | ICD-10-CM

## 2024-09-14 DIAGNOSIS — R531 Weakness: Secondary | ICD-10-CM

## 2024-09-14 DIAGNOSIS — D62 Acute posthemorrhagic anemia: Secondary | ICD-10-CM

## 2024-09-14 DIAGNOSIS — N179 Acute kidney failure, unspecified: Secondary | ICD-10-CM

## 2024-09-14 DIAGNOSIS — J9601 Acute respiratory failure with hypoxia: Secondary | ICD-10-CM

## 2024-09-14 LAB — CBC WITH DIFFERENTIAL/PLATELET
Abs Immature Granulocytes: 0.1 10*3/uL — ABNORMAL HIGH (ref 0.00–0.07)
Basophils Absolute: 0 10*3/uL (ref 0.0–0.1)
Basophils Relative: 0 %
Eosinophils Absolute: 0.1 10*3/uL (ref 0.0–0.5)
Eosinophils Relative: 2 %
HCT: 26.8 % — ABNORMAL LOW (ref 39.0–52.0)
Hemoglobin: 8.9 g/dL — ABNORMAL LOW (ref 13.0–17.0)
Immature Granulocytes: 1 %
Lymphocytes Relative: 21 %
Lymphs Abs: 1.8 10*3/uL (ref 0.7–4.0)
MCH: 30.8 pg (ref 26.0–34.0)
MCHC: 33.2 g/dL (ref 30.0–36.0)
MCV: 92.7 fL (ref 80.0–100.0)
Monocytes Absolute: 0.9 10*3/uL (ref 0.1–1.0)
Monocytes Relative: 11 %
Neutro Abs: 5.3 10*3/uL (ref 1.7–7.7)
Neutrophils Relative %: 65 %
Platelets: 237 10*3/uL (ref 150–400)
RBC: 2.89 MIL/uL — ABNORMAL LOW (ref 4.22–5.81)
RDW: 15.9 % — ABNORMAL HIGH (ref 11.5–15.5)
WBC: 8.2 10*3/uL (ref 4.0–10.5)
nRBC: 0 % (ref 0.0–0.2)

## 2024-09-14 LAB — BASIC METABOLIC PANEL WITH GFR
Anion gap: 9 (ref 5–15)
BUN: 8 mg/dL (ref 6–20)
CO2: 22 mmol/L (ref 22–32)
Calcium: 8.2 mg/dL — ABNORMAL LOW (ref 8.9–10.3)
Chloride: 106 mmol/L (ref 98–111)
Creatinine, Ser: 0.56 mg/dL — ABNORMAL LOW (ref 0.61–1.24)
GFR, Estimated: >60 mL/min
Glucose, Bld: 130 mg/dL — ABNORMAL HIGH (ref 70–99)
Potassium: 3.8 mmol/L (ref 3.5–5.1)
Sodium: 137 mmol/L (ref 135–145)

## 2024-09-14 LAB — HEMOGLOBIN AND HEMATOCRIT, BLOOD
HCT: 26.7 % — ABNORMAL LOW (ref 39.0–52.0)
Hemoglobin: 8.8 g/dL — ABNORMAL LOW (ref 13.0–17.0)

## 2024-09-14 LAB — GLUCOSE, CAPILLARY
Glucose-Capillary: 146 mg/dL — ABNORMAL HIGH (ref 70–99)
Glucose-Capillary: 101 mg/dL — ABNORMAL HIGH (ref 70–99)
Glucose-Capillary: 121 mg/dL — ABNORMAL HIGH (ref 70–99)
Glucose-Capillary: 105 mg/dL — ABNORMAL HIGH (ref 70–99)
Glucose-Capillary: 113 mg/dL — ABNORMAL HIGH (ref 70–99)

## 2024-09-14 NOTE — Plan of Care (Signed)
  Problem: Activity: Goal: Ability to tolerate increased activity will improve Outcome: Progressing   Problem: Respiratory: Goal: Ability to maintain a clear airway and adequate ventilation will improve Outcome: Progressing   Problem: Role Relationship: Goal: Method of communication will improve Outcome: Progressing   Problem: Education: Goal: Knowledge of General Education information will improve Description: Including pain rating scale, medication(s)/side effects and non-pharmacologic comfort measures Outcome: Progressing   Problem: Health Behavior/Discharge Planning: Goal: Ability to manage health-related needs will improve Outcome: Progressing   Problem: Clinical Measurements: Goal: Ability to maintain clinical measurements within normal limits will improve Outcome: Progressing Goal: Will remain free from infection Outcome: Progressing Goal: Diagnostic test results will improve Outcome: Progressing Goal: Respiratory complications will improve Outcome: Progressing Goal: Cardiovascular complication will be avoided Outcome: Progressing   Problem: Activity: Goal: Risk for activity intolerance will decrease Outcome: Progressing   Problem: Nutrition: Goal: Adequate nutrition will be maintained Outcome: Progressing   Problem: Coping: Goal: Level of anxiety will decrease Outcome: Progressing   Problem: Elimination: Goal: Will not experience complications related to bowel motility Outcome: Progressing Goal: Will not experience complications related to urinary retention Outcome: Progressing   Problem: Pain Managment: Goal: General experience of comfort will improve and/or be controlled Outcome: Progressing   Problem: Safety: Goal: Ability to remain free from injury will improve Outcome: Progressing   Problem: Skin Integrity: Goal: Risk for impaired skin integrity will decrease Outcome: Progressing   Problem: Education: Goal: Ability to describe self-care  measures that may prevent or decrease complications (Diabetes Survival Skills Education) will improve Outcome: Progressing Goal: Individualized Educational Video(s) Outcome: Progressing   Problem: Coping: Goal: Ability to adjust to condition or change in health will improve Outcome: Progressing   Problem: Fluid Volume: Goal: Ability to maintain a balanced intake and output will improve Outcome: Progressing   Problem: Health Behavior/Discharge Planning: Goal: Ability to identify and utilize available resources and services will improve Outcome: Progressing Goal: Ability to manage health-related needs will improve Outcome: Progressing   Problem: Metabolic: Goal: Ability to maintain appropriate glucose levels will improve Outcome: Progressing   Problem: Nutritional: Goal: Maintenance of adequate nutrition will improve Outcome: Progressing Goal: Progress toward achieving an optimal weight will improve Outcome: Progressing   Problem: Skin Integrity: Goal: Risk for impaired skin integrity will decrease Outcome: Progressing   Problem: Tissue Perfusion: Goal: Adequacy of tissue perfusion will improve Outcome: Progressing   Problem: Education: Goal: Understanding of CV disease, CV risk reduction, and recovery process will improve Outcome: Progressing Goal: Individualized Educational Video(s) Outcome: Progressing   Problem: Activity: Goal: Ability to return to baseline activity level will improve Outcome: Progressing   Problem: Cardiovascular: Goal: Ability to achieve and maintain adequate cardiovascular perfusion will improve Outcome: Progressing Goal: Vascular access site(s) Level 0-1 will be maintained Outcome: Progressing   Problem: Health Behavior/Discharge Planning: Goal: Ability to safely manage health-related needs after discharge will improve Outcome: Progressing

## 2024-09-14 NOTE — Hospital Course (Signed)
 54 years old male with past medical history of GERD, alcohol use disorder, who was admitted with hemorrhagic shock requiring massive blood transfusions and was admitted to the ICU.  He was found to have significant gastroduodenal artery bleeding status post gastroduodenal artery embolization by vascular surgery, done on 09/09/2024. Hemoglobin stable.  TRH resumed care on 09/12/2024.   Assessment and Plan:   Acute hemorrhagic shock - Secondary to GI bleed.  Off vasopressors.  Status post multiple PRBCs/FFP/platelets.  Resolved.   Acute bleed of the gastroduodenal artery - S/p embolization by vascular surgery 1/30.  Avoiding anticoagulation.  Per GI, will need follow-up EGD in 4-6 weeks.   Acute blood loss anemia - S/p 12 units PRBCs, 6 units FFP, 2 units platelets.  Hemoglobin now stable.  No active bleeding.   Acute kidney injury - Likely secondary to above.  Appears resolved.   Acute hypoxic respiratory failure - Initially intubated 1/30, extubated 1/31.  Now resolved, breathing comfortably on room air.   Alcohol use disorder - Counseled regarding cessation.  Receiving vitamin supplementation.   Physical debilitation muscle weakness - Ordered PT/OT.  Recommending home health.  However patient declining.

## 2024-09-14 NOTE — Progress Notes (Signed)
 " Progress Note   Patient: Edward Mckenzie FMW:[id number] DOB: 1971/02/25 DOA: 09/09/2024  DOS: the patient was seen and examined on 09/14/2024   Brief hospital course:  54 years old male with past medical history of GERD, alcohol use disorder, who was admitted with hemorrhagic shock requiring massive blood transfusions and was admitted to the ICU.  He was found to have significant gastroduodenal artery bleeding status post gastroduodenal artery embolization by vascular surgery, done on 09/09/2024. Hemoglobin stable.  TRH resumed care on 09/12/2024.  Assessment and Plan:  Acute hemorrhagic shock - Secondary to GI bleed.  Off vasopressors.  Status post multiple PRBCs/FFP/platelets.  Resolved.  Acute bleed of the gastroduodenal artery - S/p embolization by vascular surgery 1/30.  Avoiding anticoagulation.  Per GI, will need follow-up EGD in 4-6 weeks.  Acute blood loss anemia - S/p 12 units PRBCs, 6 units FFP, 2 units platelets.  Hemoglobin now stable.  No active bleeding.  Acute kidney injury - Likely secondary to above.  Appears resolved.  Acute hypoxic respiratory failure - Initially intubated 1/30, extubated 1/31.  Now resolved, breathing comfortably on room air.  Alcohol use disorder - Counseled regarding cessation.  Receiving vitamin supplementation.  Physical debilitation muscle weakness - Ordered PT/OT.  Recommending home health.  However patient declining.   Subjective: Patient resting comfortably this morning.  Continues to have concerns about being weak.  Stated he was only up a few times yesterday.  Has been encouraged to use a rolling walker however patient continues to refuse.  Also refused home health.  Stated he had a dark/bloody BM today and felt dizzy and is not comfortable going home until tomorrow.  Physical Exam:  Vitals:   09/14/24 0500 09/14/24 0628 09/14/24 0847 09/14/24 1213  BP:  106/61 112/71 97/65  Pulse:  69 69 79  Resp:  18 17 18   Temp:  99.3 F  (37.4 C) 98 F (36.7 C) 98.5 F (36.9 C)  TempSrc:  Oral Oral Oral  SpO2:  99% 98% 100%  Weight: 80.4 kg     Height:        GENERAL:  Alert, pleasant, no acute distress  HEENT:  EOMI CARDIOVASCULAR:  RRR, no murmurs appreciated RESPIRATORY:  Clear to auscultation, no wheezing, rales, or rhonchi GASTROINTESTINAL:  Soft, nontender, nondistended EXTREMITIES:  No LE edema bilaterally NEURO:  No new focal deficits appreciated SKIN:  No rashes noted PSYCH:  Appropriate mood and affect, anxious    Data Reviewed:  Imaging Studies: PERIPHERAL VASCULAR CATHETERIZATION Result Date: 09/09/2024 See surgical note for result.  CT ANGIO GI BLEED Result Date: 09/09/2024 CLINICAL DATA:  GI bleed.  Black tarry stools. EXAM: CTA ABDOMEN AND PELVIS WITHOUT AND WITH CONTRAST TECHNIQUE: Multidetector CT imaging of the abdomen and pelvis was performed using the standard protocol during bolus administration of intravenous contrast. Multiplanar reconstructed images and MIPs were obtained and reviewed to evaluate the vascular anatomy. RADIATION DOSE REDUCTION: This exam was performed according to the departmental dose-optimization program which includes automated exposure control, adjustment of the mA and/or kV according to patient size and/or use of iterative reconstruction technique. CONTRAST:  OMNIPAQUE  IOHEXOL  350 MG/ML SOLN COMPARISON:  None Available. FINDINGS: VASCULAR Aortic atherosclerosis. Large volume active extravasation from the gastroduodenal artery (4: 59-68) with contrast in the gastric antrum/proximal duodenum, extending both retrograde into the gastric body and anterograde into the proximal duodenum on the delayed venous phase images. Segmental mild-to-moderate narrowing of the celiac artery, bilateral renal arteries and bilateral common iliac arteries.  Segmental moderate to severe narrowing of the bilateral external and internal iliac and common and superficial femoral arteries due to  atherosclerosis. The superior mesenteric artery is patent. Diminutive inferior mesenteric artery. Veins: Left femoral approach venous catheter terminates at the infrarenal IVC. No obvious venous abnormality within the limitations of this arterial phase study. Review of the MIP images confirms the above findings. NON-VASCULAR Lower chest: No focal consolidation or pulmonary nodule in the lung bases. No pleural effusion or pneumothorax demonstrated. Partially imaged heart size is normal. Coronary artery calcifications. Hepatobiliary: No focal hepatic lesions. No intra or extrahepatic biliary ductal dilation. Normal gallbladder. Pancreas: No focal lesions or main ductal dilation. Spleen: Normal in size without focal abnormality. Adrenals/Urinary Tract: 3.0 x 1.9 cm left adrenal nodule measures 6 HU, in keeping with adenoma. 2.3 x 1.7 cm right adrenal nodule measures 5 HU, also likely adenoma. No specific follow-up imaging recommended. No suspicious renal mass, calculi or hydronephrosis. Subcentimeter right renal hypodensities, too small to characterize. Urinary bladder is decompressed with catheter in-situ. Small volume intraluminal gas within the urinary bladder. Stomach/Bowel: Mild fluid distention of the partially imaged esophagus. Enteric tube terminates in the gastric body. Markedly distended stomach contains heterogeneous hyperattenuating material. Mildly dilated fluid-filled small bowel loops throughout the abdomen. On the precontrast images, there is intraluminal hyperattenuation within more distal right lower quadrant small bowel loops, likely extravasated contrast material from earlier same day CT. Normal appendix. Lymphatic: No enlarged abdominal or pelvic lymph nodes. Reproductive: Mildly enlarged prostate gland. Other: No free fluid, fluid collection, or free air. Musculoskeletal: No acute or abnormal lytic or blastic osseous lesions. Multilevel degenerative changes of the partially imaged thoracic and  lumbar spine. Stranding in the left inguinal region, likely postprocedural. IMPRESSION: 1. Large volume active extravasation from the gastroduodenal artery with contrast in the gastric antrum/proximal duodenum, extending both retrograde into the gastric body and anterograde into the proximal duodenum on the delayed venous phase images. 2. Markedly distended stomach contains heterogeneous hyperattenuating material, likely blood products. 3. Mildly dilated fluid-filled small bowel loops throughout the abdomen, likely ileus. 4.  Aortic Atherosclerosis (ICD10-I70.0). Critical Value/emergent results were called by telephone at the time of interpretation on 09/09/2024 at 2:33 pm to provider JEREMIAH KEENE , who verbally acknowledged these results. Electronically Signed   By: Limin  Xu M.D.   On: 09/09/2024 14:35   DG Chest Port 1 View Result Date: 09/09/2024 EXAM: 1 VIEW(S) XRAY OF THE CHEST 09/09/2024 11:34:47 AM COMPARISON: None available. CLINICAL HISTORY: Encounter for intubation. FINDINGS: LINES, TUBES AND DEVICES: Endotracheal tube in place with tip just below the level of the clavicular heads. LUNGS AND PLEURA: No focal pulmonary opacity. No pleural effusion. No pneumothorax. HEART AND MEDIASTINUM: No acute abnormality of the cardiac and mediastinal silhouettes. BONES AND SOFT TISSUES: No acute osseous abnormality. IMPRESSION: 1. Endotracheal tube in place with tip just below the level of the clavicular heads. Electronically signed by: Waddell Calk MD 09/09/2024 12:07 PM EST RP Workstation: [id number]   CT ABDOMEN PELVIS W CONTRAST Result Date: 09/09/2024 EXAM: CT ABDOMEN AND PELVIS WITH CONTRAST 09/09/2024 09:52:50 AM TECHNIQUE: CT of the abdomen and pelvis was performed with the administration of 100 mL of iohexol  (OMNIPAQUE ) 300 MG/ML solution. Multiplanar reformatted images are provided for review. Automated exposure control, iterative reconstruction, and/or weight-based adjustment of the mA/kV was  utilized to reduce the radiation dose to as low as reasonably achievable. COMPARISON: None available. CLINICAL HISTORY: Abdominal pain, acute, nonlocalized. FINDINGS: LOWER CHEST: No acute abnormality. LIVER: The  liver is unremarkable. GALLBLADDER AND BILE DUCTS: Gallbladder is unremarkable. No biliary ductal dilatation. SPLEEN: No acute abnormality. PANCREAS: Dense calcification identified along the anterior aspect of the pancreatic head measuring 0.8 cm, image 33/3. No main duct dilatation, inflammation, or mass identified. Differential diagnosis for pancreatic head calcification includes chronic pancreatitis. Follow-up: Consider further evaluation for chronic pancreatitis. ADRENAL GLANDS: Right adrenal nodule measures 3.3 x 2.2 cm and has 28 Hounsfield units. Left adrenal nodule measures 3.3x2.2 cm and 28 hounsfield units. KIDNEYS, URETERS AND BLADDER: 8 mm, too small to characterize, Bosniak class 2 cyst arises off the inferior pole of the right kidney. Per consensus, no follow-up is needed for simple Bosniak type 1 and 2 renal cysts, unless the patient has a malignancy history or risk factors. No stones in the kidneys or ureters. No hydronephrosis. No perinephric or periureteral stranding. Urinary bladder appears normal. GI AND BOWEL: The stomach is within normal limits. There is no pathologic dilatation of the large or small bowel loops to indicate a bowel obstruction. No bowel wall thickening or inflammation identified. Scattered nonpathologically dilated air-filled loops of proximal small bowel noted. The appendix is visualized and appears normal. Mild colonic stool burden identified. PERITONEUM AND RETROPERITONEUM: No ascites. No free air. No free fluid or fluid collections. No signs of pneumoperitoneum. VASCULATURE: Aorta is normal in caliber. Aortic atherosclerotic calcification. LYMPH NODES: No lymphadenopathy. No signs of abdominal or pelvic adenopathy. REPRODUCTIVE ORGANS: Prostate gland enlargement.  Follow-up: Clinical. BONES AND SOFT TISSUES: No acute osseous abnormality. No focal soft tissue abnormality. IMPRESSION: 1. No acute findings in the abdomen or pelvis. 2. Bilateral adrenal nodules. These probably represent benign adenomas. According to consensus criteria, more definitive characterization with no-emergent adrenal washout CT or MRI is advised. Electronically signed by: Waddell Calk MD 09/09/2024 10:27 AM EST RP Workstation: [id number]    There are no new results to review at this time.  Previous records (including but not limited to H&P, progress notes, nursing notes, TOC management) were reviewed in assessment of this patient.  Labs: CBC: Recent Labs  Lab 09/09/24 0815 09/09/24 1125 09/09/24 1545 09/09/24 2103 09/10/24 2100 09/10/24 2311 09/11/24 1104 09/11/24 1828 09/12/24 0330 09/13/24 0539 09/14/24 0527  WBC 13.3*  --  8.0   < > 8.9   < > 7.8 8.4 7.1 7.1 8.2  NEUTROABS 10.9*  --  6.0  --  5.9  --   --   --   --   --  5.3  HGB 6.2*   < > 10.8*   < > 7.7*   < > 7.6* 7.9* 7.9* 8.6* 8.9*  HCT 18.7*   < > 32.9*   < > 22.2*   < > 22.1* 23.6* 23.7* 25.5* 26.8*  MCV 106.3*  --  90.1   < > 86.0   < > 89.1 89.7 90.8 91.1 92.7  PLT 249   < > 26*   < > 70*   < > 85* 105* 124* 176 237   < > = values in this interval not displayed.   Basic Metabolic Panel: Recent Labs  Lab 09/09/24 0815 09/09/24 1545 09/10/24 0507 09/10/24 1559 09/11/24 0243 09/12/24 0330 09/13/24 0539 09/14/24 0527  NA 128*   < > 137 139 137 137  137 135 137  K 4.2   < > 5.4* 4.1 3.8 3.8  3.8 3.8 3.8  CL 97*   < > 101 105 105 107  107 106 106  CO2 15*   < > 29  29 28 24  25 22 22   GLUCOSE 164*   < > 122* 100* 90 95  95 122* 130*  BUN 37*   < > 48* 37* 24* 7  7 <5* 8  CREATININE 1.07   < > 1.30* 0.91 0.65 0.50*  0.52* 0.56* 0.56*  CALCIUM  8.2*   < > 8.0* 7.5* 7.8* 7.9*  7.9* 7.9* 8.2*  MG 2.0  --  1.7  --  2.0 2.0  --   --   PHOS  --   --  7.0*  --  3.4 3.0  --   --    < > = values in  this interval not displayed.   Liver Function Tests: Recent Labs  Lab 09/09/24 0815 09/09/24 1545 09/10/24 0507 09/11/24 0243 09/12/24 0330  AST 21 31 46*  --   --   ALT 12 18 28   --   --   ALKPHOS 42 28* 35*  --   --   BILITOT 0.2 0.4 <0.2  --   --   PROT 5.3* <3.0* 3.8*  --   --   ALBUMIN 3.1* 1.8* 2.4*  2.3* 2.3* 2.4*   CBG: Recent Labs  Lab 09/13/24 2008 09/13/24 2351 09/14/24 0537 09/14/24 0845 09/14/24 1145  GLUCAP 134* 107* 146* 101* 121*    Scheduled Meds:  Chlorhexidine  Gluconate Cloth  6 each Topical Daily   feeding supplement  1 Container Oral TID BM   folic acid   1 mg Oral Daily   insulin  aspart  0-15 Units Subcutaneous Q4H   multivitamin with minerals  1 tablet Oral Daily   nicotine   14 mg Transdermal Daily   pantoprazole  (PROTONIX ) IV  40 mg Intravenous Q12H   polyethylene glycol  17 g Oral Daily   senna  1 tablet Oral BID   sodium chloride  flush  3 mL Intravenous Q12H   thiamine   100 mg Oral Daily   Or   thiamine   100 mg Intravenous Daily   Continuous Infusions: PRN Meds:.mouth rinse, polyethylene glycol, senna, sodium chloride  flush, traZODone   Family Communication: None at bedside  Disposition: Status is: Inpatient Remains inpatient appropriate because: GI bleed, weak     Time spent: 35 minutes  Length of inpatient stay: 5 days  Author: Carliss LELON Canales, DO 09/14/2024 12:42 PM  For on call review [email or web address].   "

## 2024-09-15 LAB — CBC
HCT: 32.3 % — ABNORMAL LOW (ref 39.0–52.0)
Hemoglobin: 10.3 g/dL — ABNORMAL LOW (ref 13.0–17.0)
MCH: 30.7 pg (ref 26.0–34.0)
MCHC: 31.9 g/dL (ref 30.0–36.0)
MCV: 96.1 fL (ref 80.0–100.0)
Platelets: 340 10*3/uL (ref 150–400)
RBC: 3.36 MIL/uL — ABNORMAL LOW (ref 4.22–5.81)
RDW: 16.8 % — ABNORMAL HIGH (ref 11.5–15.5)
WBC: 8.4 10*3/uL (ref 4.0–10.5)
nRBC: 0 % (ref 0.0–0.2)

## 2024-09-15 LAB — BASIC METABOLIC PANEL WITH GFR
Anion gap: 7 (ref 5–15)
BUN: 9 mg/dL (ref 6–20)
CO2: 27 mmol/L (ref 22–32)
Calcium: 8.5 mg/dL — ABNORMAL LOW (ref 8.9–10.3)
Chloride: 104 mmol/L (ref 98–111)
Creatinine, Ser: 0.64 mg/dL (ref 0.61–1.24)
GFR, Estimated: >60 mL/min
Glucose, Bld: 112 mg/dL — ABNORMAL HIGH (ref 70–99)
Potassium: 4.1 mmol/L (ref 3.5–5.1)
Sodium: 138 mmol/L (ref 135–145)

## 2024-09-15 LAB — GLUCOSE, CAPILLARY
Glucose-Capillary: 105 mg/dL — ABNORMAL HIGH (ref 70–99)
Glucose-Capillary: 108 mg/dL — ABNORMAL HIGH (ref 70–99)
Glucose-Capillary: 114 mg/dL — ABNORMAL HIGH (ref 70–99)
Glucose-Capillary: 116 mg/dL — ABNORMAL HIGH (ref 70–99)
Glucose-Capillary: 117 mg/dL — ABNORMAL HIGH (ref 70–99)
Glucose-Capillary: 126 mg/dL — ABNORMAL HIGH (ref 70–99)

## 2024-09-15 LAB — MAGNESIUM: Magnesium: 2.2 mg/dL (ref 1.7–2.4)

## 2024-09-15 MED ORDER — PANTOPRAZOLE SODIUM 40 MG PO TBEC
40.0000 mg | DELAYED_RELEASE_TABLET | Freq: Two times a day (BID) | ORAL | Status: DC
  Administered 2024-09-15 – 2024-09-16 (×2): 40 mg via ORAL
  Filled 2024-09-15 (×2): qty 1

## 2024-09-15 MED ORDER — ENSURE PLUS HIGH PROTEIN PO LIQD: 237.0000 mL | Freq: Two times a day (BID) | ORAL | Status: DC

## 2024-09-15 NOTE — Progress Notes (Signed)
 " Progress Note   Patient: Edward Mckenzie FMW:[id number] DOB: August 31, 1970 DOA: 09/09/2024  DOS: the patient was seen and examined on 09/15/2024   Brief hospital course:  54 years old male with past medical history of GERD, alcohol use disorder, who was admitted with hemorrhagic shock requiring massive blood transfusions and was admitted to the ICU.  He was found to have significant gastroduodenal artery bleeding status post gastroduodenal artery embolization by vascular surgery, done on 09/09/2024. Hemoglobin stable.  TRH resumed care on 09/12/2024.   Assessment and Plan:   Acute hemorrhagic shock - Secondary to GI bleed.  Off vasopressors.  Status post multiple PRBCs/FFP/platelets.  Resolved.   Acute bleed of the gastroduodenal artery - S/p embolization by vascular surgery 1/30.  Avoiding anticoagulation.  Per GI, will need follow-up EGD in 4-6 weeks.  Will ensure follow-up referral appointment on discharge.   Acute blood loss anemia - S/p 12 units PRBCs, 6 units FFP, 2 units platelets.  Hemoglobin now stable.  No active bleeding appreciated.  Hemoglobin trending up to 10.3 today, from 8.8 yesterday.  Will recheck CBC in a.m.   Acute kidney injury - Likely secondary to above.  Appears resolved.   Acute hypoxic respiratory failure - Initially intubated 1/30, extubated 1/31.  Now resolved, breathing comfortably on room air.   Alcohol use disorder - Counseled regarding cessation.  Receiving vitamin supplementation.  Patient to voluntarily enroll in alcohol rehab after discharge.   Physical debilitation muscle weakness - Ordered PT/OT.  Recommending home health.  However patient declining due to transition to outpatient alcohol rehab shortly after discharge.  Does not want to set up service while he is not at his house.   Subjective: Patient feeling improved this morning.  Hemoglobin up to 10.3 which he is very happy about.  Still somewhat physically weak.  Worked well with physical  therapy.  Required some resting after stairs but otherwise feels well.  In better spirits today.  Denies fever, worsening shortness of breath, chest pain, nausea, vomiting, abdominal pain.  Had a bowel movement yesterday with some old blood, possible bright blood however no bowel movements yet today.  Physical Exam:  Vitals:   09/14/24 1942 09/15/24 0406 09/15/24 0455 09/15/24 0919  BP: 118/77 105/65  114/62  Pulse: 85 66  80  Resp: 18 18  16   Temp: 98.6 F (37 C) 98 F (36.7 C)  98.8 F (37.1 C)  TempSrc:  Oral    SpO2: 99% 100%  100%  Weight:   79.6 kg   Height:        GENERAL:  Alert, pleasant, no acute distress  HEENT:  EOMI CARDIOVASCULAR:  RRR, no murmurs appreciated RESPIRATORY:  Clear to auscultation, no wheezing, rales, or rhonchi GASTROINTESTINAL:  Soft, nontender, nondistended EXTREMITIES:  No LE edema bilaterally NEURO:  No new focal deficits appreciated SKIN:  No rashes noted PSYCH:  Appropriate mood and affect     Data Reviewed:  Imaging Studies: PERIPHERAL VASCULAR CATHETERIZATION Result Date: 09/09/2024 See surgical note for result.  CT ANGIO GI BLEED Result Date: 09/09/2024 CLINICAL DATA:  GI bleed.  Black tarry stools. EXAM: CTA ABDOMEN AND PELVIS WITHOUT AND WITH CONTRAST TECHNIQUE: Multidetector CT imaging of the abdomen and pelvis was performed using the standard protocol during bolus administration of intravenous contrast. Multiplanar reconstructed images and MIPs were obtained and reviewed to evaluate the vascular anatomy. RADIATION DOSE REDUCTION: This exam was performed according to the departmental dose-optimization program which includes automated exposure control, adjustment of  the mA and/or kV according to patient size and/or use of iterative reconstruction technique. CONTRAST:  OMNIPAQUE  IOHEXOL  350 MG/ML SOLN COMPARISON:  None Available. FINDINGS: VASCULAR Aortic atherosclerosis. Large volume active extravasation from the gastroduodenal  artery (4: 59-68) with contrast in the gastric antrum/proximal duodenum, extending both retrograde into the gastric body and anterograde into the proximal duodenum on the delayed venous phase images. Segmental mild-to-moderate narrowing of the celiac artery, bilateral renal arteries and bilateral common iliac arteries. Segmental moderate to severe narrowing of the bilateral external and internal iliac and common and superficial femoral arteries due to atherosclerosis. The superior mesenteric artery is patent. Diminutive inferior mesenteric artery. Veins: Left femoral approach venous catheter terminates at the infrarenal IVC. No obvious venous abnormality within the limitations of this arterial phase study. Review of the MIP images confirms the above findings. NON-VASCULAR Lower chest: No focal consolidation or pulmonary nodule in the lung bases. No pleural effusion or pneumothorax demonstrated. Partially imaged heart size is normal. Coronary artery calcifications. Hepatobiliary: No focal hepatic lesions. No intra or extrahepatic biliary ductal dilation. Normal gallbladder. Pancreas: No focal lesions or main ductal dilation. Spleen: Normal in size without focal abnormality. Adrenals/Urinary Tract: 3.0 x 1.9 cm left adrenal nodule measures 6 HU, in keeping with adenoma. 2.3 x 1.7 cm right adrenal nodule measures 5 HU, also likely adenoma. No specific follow-up imaging recommended. No suspicious renal mass, calculi or hydronephrosis. Subcentimeter right renal hypodensities, too small to characterize. Urinary bladder is decompressed with catheter in-situ. Small volume intraluminal gas within the urinary bladder. Stomach/Bowel: Mild fluid distention of the partially imaged esophagus. Enteric tube terminates in the gastric body. Markedly distended stomach contains heterogeneous hyperattenuating material. Mildly dilated fluid-filled small bowel loops throughout the abdomen. On the precontrast images, there is intraluminal  hyperattenuation within more distal right lower quadrant small bowel loops, likely extravasated contrast material from earlier same day CT. Normal appendix. Lymphatic: No enlarged abdominal or pelvic lymph nodes. Reproductive: Mildly enlarged prostate gland. Other: No free fluid, fluid collection, or free air. Musculoskeletal: No acute or abnormal lytic or blastic osseous lesions. Multilevel degenerative changes of the partially imaged thoracic and lumbar spine. Stranding in the left inguinal region, likely postprocedural. IMPRESSION: 1. Large volume active extravasation from the gastroduodenal artery with contrast in the gastric antrum/proximal duodenum, extending both retrograde into the gastric body and anterograde into the proximal duodenum on the delayed venous phase images. 2. Markedly distended stomach contains heterogeneous hyperattenuating material, likely blood products. 3. Mildly dilated fluid-filled small bowel loops throughout the abdomen, likely ileus. 4.  Aortic Atherosclerosis (ICD10-I70.0). Critical Value/emergent results were called by telephone at the time of interpretation on 09/09/2024 at 2:33 pm to provider JEREMIAH KEENE , who verbally acknowledged these results. Electronically Signed   By: Limin  Xu M.D.   On: 09/09/2024 14:35   DG Chest Port 1 View Result Date: 09/09/2024 EXAM: 1 VIEW(S) XRAY OF THE CHEST 09/09/2024 11:34:47 AM COMPARISON: None available. CLINICAL HISTORY: Encounter for intubation. FINDINGS: LINES, TUBES AND DEVICES: Endotracheal tube in place with tip just below the level of the clavicular heads. LUNGS AND PLEURA: No focal pulmonary opacity. No pleural effusion. No pneumothorax. HEART AND MEDIASTINUM: No acute abnormality of the cardiac and mediastinal silhouettes. BONES AND SOFT TISSUES: No acute osseous abnormality. IMPRESSION: 1. Endotracheal tube in place with tip just below the level of the clavicular heads. Electronically signed by: Waddell Calk MD 09/09/2024 12:07  PM EST RP Workstation: [id number]   CT ABDOMEN PELVIS W CONTRAST Result  Date: 09/09/2024 EXAM: CT ABDOMEN AND PELVIS WITH CONTRAST 09/09/2024 09:52:50 AM TECHNIQUE: CT of the abdomen and pelvis was performed with the administration of 100 mL of iohexol  (OMNIPAQUE ) 300 MG/ML solution. Multiplanar reformatted images are provided for review. Automated exposure control, iterative reconstruction, and/or weight-based adjustment of the mA/kV was utilized to reduce the radiation dose to as low as reasonably achievable. COMPARISON: None available. CLINICAL HISTORY: Abdominal pain, acute, nonlocalized. FINDINGS: LOWER CHEST: No acute abnormality. LIVER: The liver is unremarkable. GALLBLADDER AND BILE DUCTS: Gallbladder is unremarkable. No biliary ductal dilatation. SPLEEN: No acute abnormality. PANCREAS: Dense calcification identified along the anterior aspect of the pancreatic head measuring 0.8 cm, image 33/3. No main duct dilatation, inflammation, or mass identified. Differential diagnosis for pancreatic head calcification includes chronic pancreatitis. Follow-up: Consider further evaluation for chronic pancreatitis. ADRENAL GLANDS: Right adrenal nodule measures 3.3 x 2.2 cm and has 28 Hounsfield units. Left adrenal nodule measures 3.3x2.2 cm and 28 hounsfield units. KIDNEYS, URETERS AND BLADDER: 8 mm, too small to characterize, Bosniak class 2 cyst arises off the inferior pole of the right kidney. Per consensus, no follow-up is needed for simple Bosniak type 1 and 2 renal cysts, unless the patient has a malignancy history or risk factors. No stones in the kidneys or ureters. No hydronephrosis. No perinephric or periureteral stranding. Urinary bladder appears normal. GI AND BOWEL: The stomach is within normal limits. There is no pathologic dilatation of the large or small bowel loops to indicate a bowel obstruction. No bowel wall thickening or inflammation identified. Scattered nonpathologically dilated air-filled loops  of proximal small bowel noted. The appendix is visualized and appears normal. Mild colonic stool burden identified. PERITONEUM AND RETROPERITONEUM: No ascites. No free air. No free fluid or fluid collections. No signs of pneumoperitoneum. VASCULATURE: Aorta is normal in caliber. Aortic atherosclerotic calcification. LYMPH NODES: No lymphadenopathy. No signs of abdominal or pelvic adenopathy. REPRODUCTIVE ORGANS: Prostate gland enlargement. Follow-up: Clinical. BONES AND SOFT TISSUES: No acute osseous abnormality. No focal soft tissue abnormality. IMPRESSION: 1. No acute findings in the abdomen or pelvis. 2. Bilateral adrenal nodules. These probably represent benign adenomas. According to consensus criteria, more definitive characterization with no-emergent adrenal washout CT or MRI is advised. Electronically signed by: Waddell Calk MD 09/09/2024 10:27 AM EST RP Workstation: [id number]    There are no new results to review at this time.  Previous records (including but not limited to H&P, progress notes, nursing notes, TOC management) were reviewed in assessment of this patient.  Labs: CBC: Recent Labs  Lab 09/09/24 0815 09/09/24 1125 09/09/24 1545 09/09/24 2103 09/10/24 2100 09/10/24 2311 09/11/24 1828 09/12/24 0330 09/13/24 0539 09/14/24 0527 09/14/24 2044 09/15/24 0756  WBC 13.3*  --  8.0   < > 8.9   < > 8.4 7.1 7.1 8.2  --  8.4  NEUTROABS 10.9*  --  6.0  --  5.9  --   --   --   --  5.3  --   --   HGB 6.2*   < > 10.8*   < > 7.7*   < > 7.9* 7.9* 8.6* 8.9* 8.8* 10.3*  HCT 18.7*   < > 32.9*   < > 22.2*   < > 23.6* 23.7* 25.5* 26.8* 26.7* 32.3*  MCV 106.3*  --  90.1   < > 86.0   < > 89.7 90.8 91.1 92.7  --  96.1  PLT 249   < > 26*   < > 70*   < >  105* 124* 176 237  --  340   < > = values in this interval not displayed.   Basic Metabolic Panel: Recent Labs  Lab 09/09/24 0815 09/09/24 1545 09/10/24 0507 09/10/24 1559 09/11/24 0243 09/12/24 0330 09/13/24 0539 09/14/24 0527  09/15/24 0756  NA 128*   < > 137   < > 137 137  137 135 137 138  K 4.2   < > 5.4*   < > 3.8 3.8  3.8 3.8 3.8 4.1  CL 97*   < > 101   < > 105 107  107 106 106 104  CO2 15*   < > 29   < > 28 24  25 22 22 27   GLUCOSE 164*   < > 122*   < > 90 95  95 122* 130* 112*  BUN 37*   < > 48*   < > 24* 7  7 <5* 8 9  CREATININE 1.07   < > 1.30*   < > 0.65 0.50*  0.52* 0.56* 0.56* 0.64  CALCIUM  8.2*   < > 8.0*   < > 7.8* 7.9*  7.9* 7.9* 8.2* 8.5*  MG 2.0  --  1.7  --  2.0 2.0  --   --  2.2  PHOS  --   --  7.0*  --  3.4 3.0  --   --   --    < > = values in this interval not displayed.   Liver Function Tests: Recent Labs  Lab 09/09/24 0815 09/09/24 1545 09/10/24 0507 09/11/24 0243 09/12/24 0330  AST 21 31 46*  --   --   ALT 12 18 28   --   --   ALKPHOS 42 28* 35*  --   --   BILITOT 0.2 0.4 <0.2  --   --   PROT 5.3* <3.0* 3.8*  --   --   ALBUMIN 3.1* 1.8* 2.4*  2.3* 2.3* 2.4*   CBG: Recent Labs  Lab 09/14/24 1951 09/15/24 0005 09/15/24 0415 09/15/24 0922 09/15/24 1138  GLUCAP 113* 105* 114* 126* 116*    Scheduled Meds:  feeding supplement  237 mL Oral BID BM   folic acid   1 mg Oral Daily   insulin  aspart  0-15 Units Subcutaneous Q4H   multivitamin with minerals  1 tablet Oral Daily   nicotine   14 mg Transdermal Daily   pantoprazole   40 mg Oral BID   sodium chloride  flush  3 mL Intravenous Q12H   thiamine   100 mg Oral Daily   Continuous Infusions: PRN Meds:.mouth rinse, polyethylene glycol, senna, sodium chloride  flush, traZODone   Family Communication: None at bedside  Disposition: Status is: Inpatient Remains inpatient appropriate because: GI bleed, anticipate discharge tomorrow     Time spent: 40 minutes  Length of inpatient stay: 6 days  Author: Carliss LELON Canales, DO 09/15/2024 2:00 PM  For on call review [email or web address].   "

## 2024-09-15 NOTE — Plan of Care (Signed)
  Problem: Activity: Goal: Ability to tolerate increased activity will improve Outcome: Progressing   Problem: Respiratory: Goal: Ability to maintain a clear airway and adequate ventilation will improve Outcome: Progressing   Problem: Role Relationship: Goal: Method of communication will improve Outcome: Progressing   Problem: Education: Goal: Knowledge of General Education information will improve Description: Including pain rating scale, medication(s)/side effects and non-pharmacologic comfort measures Outcome: Progressing   Problem: Health Behavior/Discharge Planning: Goal: Ability to manage health-related needs will improve Outcome: Progressing   Problem: Clinical Measurements: Goal: Ability to maintain clinical measurements within normal limits will improve Outcome: Progressing Goal: Will remain free from infection Outcome: Progressing Goal: Diagnostic test results will improve Outcome: Progressing Goal: Respiratory complications will improve Outcome: Progressing Goal: Cardiovascular complication will be avoided Outcome: Progressing   Problem: Activity: Goal: Risk for activity intolerance will decrease Outcome: Progressing   Problem: Nutrition: Goal: Adequate nutrition will be maintained Outcome: Progressing   Problem: Coping: Goal: Level of anxiety will decrease Outcome: Progressing   Problem: Elimination: Goal: Will not experience complications related to bowel motility Outcome: Progressing Goal: Will not experience complications related to urinary retention Outcome: Progressing   Problem: Pain Managment: Goal: General experience of comfort will improve and/or be controlled Outcome: Progressing   Problem: Safety: Goal: Ability to remain free from injury will improve Outcome: Progressing   Problem: Skin Integrity: Goal: Risk for impaired skin integrity will decrease Outcome: Progressing   Problem: Education: Goal: Ability to describe self-care  measures that may prevent or decrease complications (Diabetes Survival Skills Education) will improve Outcome: Progressing Goal: Individualized Educational Video(s) Outcome: Progressing   Problem: Coping: Goal: Ability to adjust to condition or change in health will improve Outcome: Progressing   Problem: Fluid Volume: Goal: Ability to maintain a balanced intake and output will improve Outcome: Progressing   Problem: Health Behavior/Discharge Planning: Goal: Ability to identify and utilize available resources and services will improve Outcome: Progressing Goal: Ability to manage health-related needs will improve Outcome: Progressing   Problem: Metabolic: Goal: Ability to maintain appropriate glucose levels will improve Outcome: Progressing   Problem: Nutritional: Goal: Maintenance of adequate nutrition will improve Outcome: Progressing Goal: Progress toward achieving an optimal weight will improve Outcome: Progressing   Problem: Skin Integrity: Goal: Risk for impaired skin integrity will decrease Outcome: Progressing   Problem: Tissue Perfusion: Goal: Adequacy of tissue perfusion will improve Outcome: Progressing   Problem: Education: Goal: Understanding of CV disease, CV risk reduction, and recovery process will improve Outcome: Progressing Goal: Individualized Educational Video(s) Outcome: Progressing   Problem: Activity: Goal: Ability to return to baseline activity level will improve Outcome: Progressing   Problem: Cardiovascular: Goal: Ability to achieve and maintain adequate cardiovascular perfusion will improve Outcome: Progressing Goal: Vascular access site(s) Level 0-1 will be maintained Outcome: Progressing   Problem: Health Behavior/Discharge Planning: Goal: Ability to safely manage health-related needs after discharge will improve Outcome: Progressing

## 2024-09-15 NOTE — TOC Progression Note (Signed)
 Transition of Care [facility]) - Progression Note    Patient Details  Name: Edward Mckenzie MRN: [id number] Date of Birth: 1970/09/03  [facility]) CM/SW Contact  Nathanael CHRISTELLA Ring, RN Phone Number: 09/15/2024, 3:30 PM  Clinical Narrative:     Patient requested for this CM to fax medical records over to [facility] in [place], this is where he would like to go for inpatient substance abuse treatment.  Records faxed to [phone] per patient request- including MAR, vitals, H&P progress notes and facesheet.     Barriers to Discharge: Continued Medical Work up               Expected Discharge Plan and Services       Living arrangements for the past 2 months: Apartment                                       Social Drivers of Health (SDOH) Interventions SDOH Screenings   Food Insecurity: Patient Unable To Answer (09/09/2024)  Housing: Patient Unable To Answer (09/09/2024)  Transportation Needs: Patient Unable To Answer (09/09/2024)  Utilities: Patient Unable To Answer (09/09/2024)  Depression (PHQ2-9): Low Risk (11/19/2023)  Tobacco Use: High Risk (09/12/2024)    Readmission Risk Interventions     No data to display

## 2024-09-15 NOTE — Progress Notes (Signed)
 Physical Therapy Treatment Patient Details Name: Edward Mckenzie MRN: [id number] DOB: 1970-11-11 Today's Date: 09/15/2024   History of Present Illness Pt is a 54 year old male admitted with  Gastroduodenal artery bleed status postembolization on 01/30, hemorrhagic shock, AKI, Acute hypoxic respiratory failure requiring intubation and mechanical ventilation on 01/30 , extubated 09/10/2024.     PMH significant for hypertension and GERD    PT Comments  Pt received in Semi-Fowler's position and agreeable to therapy.  Pt somewhat rude with therapist in regards to not having therapy yesterday and even with education, pt was not thrilled with not having therapy.  Pt was encouraged that if he wanted to get up and mobilize, he was more than capable doing this with nursing, however pt did not seem to take education in well.  Pt mobilized to the restroom with supervision and use of walker before continuing out of the room.  Pt then ambulated to the therapy gym where he performed stair training.  Pt performed well, needing no physical assistance, CGA only for safety.  Pt noted to be SOB when at the bottom of the stairs, however vitals remained consistently good (SpO2: >98%; HR 95-102 bpm).  Pt then ambulated back to the room with one standing rest break between the gym and to the room.  Pt then left with all needs met and call bell within reach.  MD notified of progress.    If plan is discharge home, recommend the following: A little help with walking and/or transfers;A little help with bathing/dressing/bathroom   Can travel by private vehicle        Equipment Recommendations  Rolling walker (2 wheels)    Recommendations for Other Services       Precautions / Restrictions Precautions Precautions: Fall Recall of Precautions/Restrictions: Intact Restrictions Weight Bearing Restrictions Per Provider Order: No     Mobility  Bed Mobility Overal bed mobility: Modified Independent Bed Mobility:  Supine to Sit, Sit to Supine     Supine to sit: HOB elevated, Independent Sit to supine: Independent   General bed mobility comments: increased time    Transfers Overall transfer level: Needs assistance Equipment used: Rolling walker (2 wheels) Transfers: Sit to/from Stand Sit to Stand: Supervision           General transfer comment: increased time    Ambulation/Gait Ambulation/Gait assistance: Supervision Gait Distance (Feet): 200 Feet Assistive device: Rolling walker (2 wheels) Gait Pattern/deviations: Step-through pattern Gait velocity: decreased     General Gait Details: pt relies pretty heavily on the walker, specifically when becoming concerned about his breahting and fatigue.  Pt encouraged to utilize pursed lip breathing, however pt's vitals remained WNL (HR: 95-102 bpm with walking; SpO2: >98% throughout)   Stairs             Wheelchair Mobility     Tilt Bed    Modified Rankin (Stroke Patients Only)       Balance Overall balance assessment: Needs assistance Sitting-balance support: Feet supported Sitting balance-Leahy Scale: Good     Standing balance support: Bilateral upper extremity supported Standing balance-Leahy Scale: Fair Standing balance comment: with RW for UE support in standing                            Communication Communication Communication: No apparent difficulties  Cognition Arousal: Alert Behavior During Therapy: WFL for tasks assessed/performed   PT - Cognitive impairments: No apparent impairments  Following commands: Intact      Cueing Cueing Techniques: Verbal cues  Exercises      General Comments        Pertinent Vitals/Pain Pain Assessment Pain Assessment: Faces Faces Pain Scale: Hurts a little bit Pain Location: L calf Pain Descriptors / Indicators: Cramping Pain Intervention(s): Monitored during session, Repositioned    Home Living                           Prior Function            PT Goals (current goals can now be found in the care plan section) Acute Rehab PT Goals Patient Stated Goal: home PT Goal Formulation: With patient Time For Goal Achievement: 09/26/24 Potential to Achieve Goals: Fair Progress towards PT goals: Progressing toward goals    Frequency    Min 2X/week      PT Plan      Co-evaluation              AM-PAC PT 6 Clicks Mobility   Outcome Measure  Help needed turning from your back to your side while in a flat bed without using bedrails?: None Help needed moving from lying on your back to sitting on the side of a flat bed without using bedrails?: A Little Help needed moving to and from a bed to a chair (including a wheelchair)?: A Little Help needed standing up from a chair using your arms (e.g., wheelchair or bedside chair)?: A Little Help needed to walk in hospital room?: A Little Help needed climbing 3-5 steps with a railing? : A Little 6 Click Score: 19    End of Session Equipment Utilized During Treatment: Gait belt Activity Tolerance: Patient limited by fatigue Patient left: in bed;with call bell/phone within reach;with bed alarm set Nurse Communication: Mobility status PT Visit Diagnosis: Muscle weakness (generalized) (M62.81);Unsteadiness on feet (R26.81)     Time: 8952-8887 PT Time Calculation (min) (ACUTE ONLY): 25 min  Charges:    $Therapeutic Activity: 23-37 mins PT General Charges $$ ACUTE PT VISIT: 1 Visit                     Fonda Simpers, PT, DPT Physical Therapist - [facility] [place] [facility]  09/15/24, 1:43 PM

## 2024-09-16 ENCOUNTER — Encounter: Payer: Self-pay | Admitting: Emergency Medicine

## 2024-09-16 ENCOUNTER — Other Ambulatory Visit: Payer: Self-pay

## 2024-09-16 LAB — BASIC METABOLIC PANEL WITH GFR
Anion gap: 8 (ref 5–15)
BUN: 9 mg/dL (ref 6–20)
CO2: 24 mmol/L (ref 22–32)
Calcium: 8.3 mg/dL — ABNORMAL LOW (ref 8.9–10.3)
Chloride: 105 mmol/L (ref 98–111)
Creatinine, Ser: 0.57 mg/dL — ABNORMAL LOW (ref 0.61–1.24)
GFR, Estimated: >60 mL/min
Glucose, Bld: 128 mg/dL — ABNORMAL HIGH (ref 70–99)
Potassium: 3.9 mmol/L (ref 3.5–5.1)
Sodium: 137 mmol/L (ref 135–145)

## 2024-09-16 LAB — CBC
HCT: 27.9 % — ABNORMAL LOW (ref 39.0–52.0)
Hemoglobin: 8.8 g/dL — ABNORMAL LOW (ref 13.0–17.0)
MCH: 30.4 pg (ref 26.0–34.0)
MCHC: 31.5 g/dL (ref 30.0–36.0)
MCV: 96.5 fL (ref 80.0–100.0)
Platelets: 328 10*3/uL (ref 150–400)
RBC: 2.89 MIL/uL — ABNORMAL LOW (ref 4.22–5.81)
RDW: 17 % — ABNORMAL HIGH (ref 11.5–15.5)
WBC: 6.7 10*3/uL (ref 4.0–10.5)
nRBC: 0 % (ref 0.0–0.2)

## 2024-09-16 MED ORDER — PANTOPRAZOLE SODIUM 40 MG PO TBEC
40.0000 mg | DELAYED_RELEASE_TABLET | Freq: Two times a day (BID) | ORAL | 2 refills | Status: AC
  Filled 2024-09-16: qty 60, 30d supply, fill #0

## 2024-09-16 MED ORDER — NICOTINE 14 MG/24HR TD PT24
14.0000 mg | MEDICATED_PATCH | Freq: Every day | TRANSDERMAL | 0 refills | Status: AC
  Filled 2024-09-16: qty 28, 28d supply, fill #0

## 2024-09-16 MED ORDER — TRAZODONE HCL 100 MG PO TABS
100.0000 mg | ORAL_TABLET | Freq: Every evening | ORAL | 0 refills | Status: AC | PRN
  Filled 2024-09-16: qty 90, 90d supply, fill #0

## 2024-09-16 NOTE — Discharge Summary (Signed)
 " Physician Discharge Summary   Patient: Edward Mckenzie MRN: [id number] DOB: [birth year]  Admit date:     09/09/2024  Discharge date: 09/16/24  Discharge Physician: Carliss LELON Canales   PCP: Vicci Duwaine SQUIBB, DO   Recommendations at discharge:    Pt to be discharged home.   If you experience worsening fever, chills, chest pain, shortness of breath, or other concerning symptoms, please call your PCP or go to the emergency department immediately.  Discharge Diagnoses: Principal Problem:   Upper GI bleed Active Problems:   Hemorrhagic shock (HCC)   Acute blood loss anemia   AKI (acute kidney injury)   Weakness   Acute hypoxic respiratory failure (HCC)  Resolved Problems:   * No resolved hospital problems. *   Hospital Course:  54 years old male with past medical history of GERD, alcohol use disorder, who was admitted with hemorrhagic shock requiring massive blood transfusions and was admitted to the ICU.  He was found to have significant gastroduodenal artery bleeding status post gastroduodenal artery embolization by vascular surgery, done on 09/09/2024. Hemoglobin stable.  TRH resumed care on 09/12/2024.   Assessment and Plan:   Acute hemorrhagic shock - Secondary to GI bleed.  Off vasopressors.  Status post multiple PRBCs/FFP/platelets.  Resolved.   Acute bleed of the gastroduodenal artery - S/p embolization by vascular surgery 1/30.  Avoiding anticoagulation.  Per GI, will need follow-up EGD in 4-6 weeks.  Discontinue any NSAID therapy including ibuprofen , naproxen , BC powder.  Will ensure follow-up referral appointment on discharge.   Acute blood loss anemia - S/p 12 units PRBCs, 6 units FFP, 2 units platelets.  Hemoglobin now stable.  No active bleeding appreciated.  Hemoglobin stable, no new bleeding.   Acute kidney injury - Likely secondary to above.  Appears resolved.   Acute hypoxic respiratory failure - Initially intubated 1/30, extubated 1/31.  Now resolved,  breathing comfortably on room air.   Alcohol use disorder - Counseled regarding cessation.  Receiving vitamin supplementation while inpatient.  Patient to voluntarily enroll in alcohol rehab after discharge.  Motivated to quit.  Tobacco dependence - Nicotine  patch provided.  Encourage cessation.  Patient motivated to continue nicotine  patch after discharge.   Physical debilitation muscle weakness - Ordered PT/OT.  Recommending home health.  However patient declining due to transition to outpatient alcohol rehab shortly after discharge.  Does not want to set up service while he is not at his house.   Consultants: Gastroenterology, interventional radiology Procedures performed: Embolization of gastroduodenal artery Disposition: Home Diet recommendation:  Regular diet  DISCHARGE MEDICATION: Allergies as of 09/16/2024   No Known Allergies      Medication List     STOP taking these medications    calcium  carbonate 500 MG chewable tablet Commonly known as: TUMS - dosed in mg elemental calcium    lisinopril  10 MG tablet Commonly known as: ZESTRIL    naproxen  500 MG tablet Commonly known as: NAPROSYN    ROLAIDS PO   vitamin C 100 MG tablet       TAKE these medications    fexofenadine  180 MG tablet Commonly known as: ALLEGRA  TAKE 1 TABLET BY MOUTH EVERY DAY   montelukast  10 MG tablet Commonly known as: SINGULAIR  TAKE 1 TABLET BY MOUTH EVERYDAY AT BEDTIME   nicotine  14 mg/24hr patch Commonly known as: NICODERM CQ  - dosed in mg/24 hours Place 1 patch (14 mg total) onto the skin daily. Start taking on: September 17, 2024   pantoprazole  40 MG tablet Commonly  known as: PROTONIX  Take 1 tablet (40 mg total) by mouth 2 (two) times daily.   tadalafil  20 MG tablet Commonly known as: CIALIS  Take 0.5-1 tablets (10-20 mg total) by mouth every other day as needed for erectile dysfunction.   traZODone  100 MG tablet Commonly known as: DESYREL  Take 1 tablet (100 mg total) by  mouth at bedtime as needed. for sleep         Discharge Exam: Filed Weights   09/15/24 0455 09/15/24 2128 09/16/24 0500  Weight: 79.6 kg 79.3 kg 78.5 kg    GENERAL:  Alert, pleasant, no acute distress  HEENT:  EOMI CARDIOVASCULAR:  RRR, no murmurs appreciated RESPIRATORY:  Clear to auscultation, no wheezing, rales, or rhonchi GASTROINTESTINAL:  Soft, nontender, nondistended EXTREMITIES:  No LE edema bilaterally NEURO:  No new focal deficits appreciated SKIN:  No rashes noted PSYCH:  Appropriate mood and affect     Condition at discharge: improving  The results of significant diagnostics from this hospitalization (including imaging, microbiology, ancillary and laboratory) are listed below for reference.   Imaging Studies: PERIPHERAL VASCULAR CATHETERIZATION Result Date: 09/09/2024 See surgical note for result.  CT ANGIO GI BLEED Result Date: 09/09/2024 CLINICAL DATA:  GI bleed.  Black tarry stools. EXAM: CTA ABDOMEN AND PELVIS WITHOUT AND WITH CONTRAST TECHNIQUE: Multidetector CT imaging of the abdomen and pelvis was performed using the standard protocol during bolus administration of intravenous contrast. Multiplanar reconstructed images and MIPs were obtained and reviewed to evaluate the vascular anatomy. RADIATION DOSE REDUCTION: This exam was performed according to the departmental dose-optimization program which includes automated exposure control, adjustment of the mA and/or kV according to patient size and/or use of iterative reconstruction technique. CONTRAST:  OMNIPAQUE  IOHEXOL  350 MG/ML SOLN COMPARISON:  None Available. FINDINGS: VASCULAR Aortic atherosclerosis. Large volume active extravasation from the gastroduodenal artery (4: 59-68) with contrast in the gastric antrum/proximal duodenum, extending both retrograde into the gastric body and anterograde into the proximal duodenum on the delayed venous phase images. Segmental mild-to-moderate narrowing of the celiac  artery, bilateral renal arteries and bilateral common iliac arteries. Segmental moderate to severe narrowing of the bilateral external and internal iliac and common and superficial femoral arteries due to atherosclerosis. The superior mesenteric artery is patent. Diminutive inferior mesenteric artery. Veins: Left femoral approach venous catheter terminates at the infrarenal IVC. No obvious venous abnormality within the limitations of this arterial phase study. Review of the MIP images confirms the above findings. NON-VASCULAR Lower chest: No focal consolidation or pulmonary nodule in the lung bases. No pleural effusion or pneumothorax demonstrated. Partially imaged heart size is normal. Coronary artery calcifications. Hepatobiliary: No focal hepatic lesions. No intra or extrahepatic biliary ductal dilation. Normal gallbladder. Pancreas: No focal lesions or main ductal dilation. Spleen: Normal in size without focal abnormality. Adrenals/Urinary Tract: 3.0 x 1.9 cm left adrenal nodule measures 6 HU, in keeping with adenoma. 2.3 x 1.7 cm right adrenal nodule measures 5 HU, also likely adenoma. No specific follow-up imaging recommended. No suspicious renal mass, calculi or hydronephrosis. Subcentimeter right renal hypodensities, too small to characterize. Urinary bladder is decompressed with catheter in-situ. Small volume intraluminal gas within the urinary bladder. Stomach/Bowel: Mild fluid distention of the partially imaged esophagus. Enteric tube terminates in the gastric body. Markedly distended stomach contains heterogeneous hyperattenuating material. Mildly dilated fluid-filled small bowel loops throughout the abdomen. On the precontrast images, there is intraluminal hyperattenuation within more distal right lower quadrant small bowel loops, likely extravasated contrast material from earlier same day  CT. Normal appendix. Lymphatic: No enlarged abdominal or pelvic lymph nodes. Reproductive: Mildly enlarged prostate  gland. Other: No free fluid, fluid collection, or free air. Musculoskeletal: No acute or abnormal lytic or blastic osseous lesions. Multilevel degenerative changes of the partially imaged thoracic and lumbar spine. Stranding in the left inguinal region, likely postprocedural. IMPRESSION: 1. Large volume active extravasation from the gastroduodenal artery with contrast in the gastric antrum/proximal duodenum, extending both retrograde into the gastric body and anterograde into the proximal duodenum on the delayed venous phase images. 2. Markedly distended stomach contains heterogeneous hyperattenuating material, likely blood products. 3. Mildly dilated fluid-filled small bowel loops throughout the abdomen, likely ileus. 4.  Aortic Atherosclerosis (ICD10-I70.0). Critical Value/emergent results were called by telephone at the time of interpretation on 09/09/2024 at 2:33 pm to provider JEREMIAH KEENE , who verbally acknowledged these results. Electronically Signed   By: Limin  Xu M.D.   On: 09/09/2024 14:35   DG Chest Port 1 View Result Date: 09/09/2024 EXAM: 1 VIEW(S) XRAY OF THE CHEST 09/09/2024 11:34:47 AM COMPARISON: None available. CLINICAL HISTORY: Encounter for intubation. FINDINGS: LINES, TUBES AND DEVICES: Endotracheal tube in place with tip just below the level of the clavicular heads. LUNGS AND PLEURA: No focal pulmonary opacity. No pleural effusion. No pneumothorax. HEART AND MEDIASTINUM: No acute abnormality of the cardiac and mediastinal silhouettes. BONES AND SOFT TISSUES: No acute osseous abnormality. IMPRESSION: 1. Endotracheal tube in place with tip just below the level of the clavicular heads. Electronically signed by: Waddell Calk MD 09/09/2024 12:07 PM EST RP Workstation: [id number]   CT ABDOMEN PELVIS W CONTRAST Result Date: 09/09/2024 EXAM: CT ABDOMEN AND PELVIS WITH CONTRAST 09/09/2024 09:52:50 AM TECHNIQUE: CT of the abdomen and pelvis was performed with the administration of 100 mL of  iohexol  (OMNIPAQUE ) 300 MG/ML solution. Multiplanar reformatted images are provided for review. Automated exposure control, iterative reconstruction, and/or weight-based adjustment of the mA/kV was utilized to reduce the radiation dose to as low as reasonably achievable. COMPARISON: None available. CLINICAL HISTORY: Abdominal pain, acute, nonlocalized. FINDINGS: LOWER CHEST: No acute abnormality. LIVER: The liver is unremarkable. GALLBLADDER AND BILE DUCTS: Gallbladder is unremarkable. No biliary ductal dilatation. SPLEEN: No acute abnormality. PANCREAS: Dense calcification identified along the anterior aspect of the pancreatic head measuring 0.8 cm, image 33/3. No main duct dilatation, inflammation, or mass identified. Differential diagnosis for pancreatic head calcification includes chronic pancreatitis. Follow-up: Consider further evaluation for chronic pancreatitis. ADRENAL GLANDS: Right adrenal nodule measures 3.3 x 2.2 cm and has 28 Hounsfield units. Left adrenal nodule measures 3.3x2.2 cm and 28 hounsfield units. KIDNEYS, URETERS AND BLADDER: 8 mm, too small to characterize, Bosniak class 2 cyst arises off the inferior pole of the right kidney. Per consensus, no follow-up is needed for simple Bosniak type 1 and 2 renal cysts, unless the patient has a malignancy history or risk factors. No stones in the kidneys or ureters. No hydronephrosis. No perinephric or periureteral stranding. Urinary bladder appears normal. GI AND BOWEL: The stomach is within normal limits. There is no pathologic dilatation of the large or small bowel loops to indicate a bowel obstruction. No bowel wall thickening or inflammation identified. Scattered nonpathologically dilated air-filled loops of proximal small bowel noted. The appendix is visualized and appears normal. Mild colonic stool burden identified. PERITONEUM AND RETROPERITONEUM: No ascites. No free air. No free fluid or fluid collections. No signs of pneumoperitoneum.  VASCULATURE: Aorta is normal in caliber. Aortic atherosclerotic calcification. LYMPH NODES: No lymphadenopathy. No signs of abdominal or  pelvic adenopathy. REPRODUCTIVE ORGANS: Prostate gland enlargement. Follow-up: Clinical. BONES AND SOFT TISSUES: No acute osseous abnormality. No focal soft tissue abnormality. IMPRESSION: 1. No acute findings in the abdomen or pelvis. 2. Bilateral adrenal nodules. These probably represent benign adenomas. According to consensus criteria, more definitive characterization with no-emergent adrenal washout CT or MRI is advised. Electronically signed by: Waddell Calk MD 09/09/2024 10:27 AM EST RP Workstation: [id number]    Microbiology: Results for orders placed or performed during the hospital encounter of 09/09/24  MRSA Next Gen by PCR, Nasal     Status: Abnormal   Collection Time: 09/09/24 11:15 AM   Specimen: Nasal Mucosa; Nasal Swab  Result Value Ref Range Status   MRSA by PCR Next Gen DETECTED (A) NOT DETECTED Final    Comment: RESULT CALLED TO, READ BACK BY AND VERIFIED WITH: CYNTHIA VASQUEZ RN AT 2015 ON 09/09/2024 BY KC (NOTE) The GeneXpert MRSA Assay (FDA approved for NASAL specimens only), is one component of a comprehensive MRSA colonization surveillance program. It is not intended to diagnose MRSA infection nor to guide or monitor treatment for MRSA infections. Test performance is not FDA approved in patients less than 7 years old. Performed at [facility], [address] [place], [place] [address]     Labs: CBC: Recent Labs  Lab 09/09/24 1545 09/09/24 2103 09/10/24 2100 09/10/24 2311 09/12/24 0330 09/13/24 0539 09/14/24 0527 09/14/24 2044 09/15/24 0756 09/16/24 0517  WBC 8.0   < > 8.9   < > 7.1 7.1 8.2  --  8.4 6.7  NEUTROABS 6.0  --  5.9  --   --   --  5.3  --   --   --   HGB 10.8*   < > 7.7*   < > 7.9* 8.6* 8.9* 8.8* 10.3* 8.8*  HCT 32.9*   < > 22.2*   < > 23.7* 25.5* 26.8* 26.7* 32.3* 27.9*  MCV 90.1   < > 86.0   <  > 90.8 91.1 92.7  --  96.1 96.5  PLT 26*   < > 70*   < > 124* 176 237  --  340 328   < > = values in this interval not displayed.   Basic Metabolic Panel: Recent Labs  Lab 09/10/24 0507 09/10/24 1559 09/11/24 0243 09/12/24 0330 09/13/24 0539 09/14/24 0527 09/15/24 0756 09/16/24 0517  NA 137   < > 137 137  137 135 137 138 137  K 5.4*   < > 3.8 3.8  3.8 3.8 3.8 4.1 3.9  CL 101   < > 105 107  107 106 106 104 105  CO2 29   < > 28 24  25 22 22 27 24   GLUCOSE 122*   < > 90 95  95 122* 130* 112* 128*  BUN 48*   < > 24* 7  7 <5* 8 9 9   CREATININE 1.30*   < > 0.65 0.50*  0.52* 0.56* 0.56* 0.64 0.57*  CALCIUM  8.0*   < > 7.8* 7.9*  7.9* 7.9* 8.2* 8.5* 8.3*  MG 1.7  --  2.0 2.0  --   --  2.2  --   PHOS 7.0*  --  3.4 3.0  --   --   --   --    < > = values in this interval not displayed.   Liver Function Tests: Recent Labs  Lab 09/09/24 1545 09/10/24 0507 09/11/24 0243 09/12/24 0330  AST 31 46*  --   --  ALT 18 28  --   --   ALKPHOS 28* 35*  --   --   BILITOT 0.4 <0.2  --   --   PROT <3.0* 3.8*  --   --   ALBUMIN 1.8* 2.4*  2.3* 2.3* 2.4*   CBG: Recent Labs  Lab 09/15/24 0415 09/15/24 0922 09/15/24 1138 09/15/24 1613 09/15/24 2143  GLUCAP 114* 126* 116* 117* 108*    Discharge time spent: 35 minutes.  Length of inpatient stay: 7 days  Signed: Carliss LELON Canales, DO [facility] 09/16/2024         "
